# Patient Record
Sex: Male | Born: 1950 | Race: White | Hispanic: No | Smoking: Current every day smoker
Health system: Southern US, Community
[De-identification: ages and names within clinical notes are randomized; demographics above are authoritative.]

## PROBLEM LIST (undated history)

## (undated) DIAGNOSIS — G8929 Other chronic pain: Secondary | ICD-10-CM

## (undated) DIAGNOSIS — G4700 Insomnia, unspecified: Secondary | ICD-10-CM

## (undated) DIAGNOSIS — F4000 Agoraphobia, unspecified: Secondary | ICD-10-CM

## (undated) DIAGNOSIS — F329 Major depressive disorder, single episode, unspecified: Secondary | ICD-10-CM

## (undated) DIAGNOSIS — R9389 Abnormal findings on diagnostic imaging of other specified body structures: Secondary | ICD-10-CM

## (undated) DIAGNOSIS — M8580 Other specified disorders of bone density and structure, unspecified site: Secondary | ICD-10-CM

## (undated) DIAGNOSIS — F32A Depression, unspecified: Secondary | ICD-10-CM

## (undated) DIAGNOSIS — D689 Coagulation defect, unspecified: Secondary | ICD-10-CM

## (undated) DIAGNOSIS — R600 Localized edema: Secondary | ICD-10-CM

## (undated) DIAGNOSIS — R609 Edema, unspecified: Secondary | ICD-10-CM

## (undated) DIAGNOSIS — M545 Other chronic pain: Secondary | ICD-10-CM

## (undated) DIAGNOSIS — J449 Chronic obstructive pulmonary disease, unspecified: Secondary | ICD-10-CM

## (undated) DIAGNOSIS — D66 Hereditary factor VIII deficiency: Secondary | ICD-10-CM

## (undated) DIAGNOSIS — F319 Bipolar disorder, unspecified: Secondary | ICD-10-CM

## (undated) DIAGNOSIS — G629 Polyneuropathy, unspecified: Secondary | ICD-10-CM

## (undated) DIAGNOSIS — F419 Anxiety disorder, unspecified: Secondary | ICD-10-CM

## (undated) HISTORY — DX: Low back pain: M54.5

## (undated) HISTORY — DX: Abnormal findings on diagnostic imaging of other specified body structures: R93.89

## (undated) HISTORY — DX: Other specified disorders of bone density and structure, unspecified site: M85.80

## (undated) HISTORY — DX: Other chronic pain: G89.29

## (undated) HISTORY — DX: Depression, unspecified: F32.A

## (undated) HISTORY — DX: Bipolar disorder, unspecified: F31.9

## (undated) HISTORY — DX: Major depressive disorder, single episode, unspecified: F32.9

## (undated) HISTORY — DX: Other chronic pain: M54.50

## (undated) HISTORY — DX: Polyneuropathy, unspecified: G62.9

## (undated) HISTORY — DX: Agoraphobia, unspecified: F40.00

---

## 2010-06-20 ENCOUNTER — Encounter: Payer: Self-pay | Admitting: Nurse Practitioner

## 2012-03-17 ENCOUNTER — Other Ambulatory Visit: Payer: Self-pay | Admitting: *Deleted

## 2012-03-17 DIAGNOSIS — M8580 Other specified disorders of bone density and structure, unspecified site: Secondary | ICD-10-CM

## 2012-04-01 ENCOUNTER — Other Ambulatory Visit: Payer: Self-pay

## 2012-04-01 ENCOUNTER — Ambulatory Visit: Payer: Self-pay

## 2012-05-24 ENCOUNTER — Other Ambulatory Visit: Payer: Self-pay | Admitting: Nurse Practitioner

## 2012-07-06 ENCOUNTER — Other Ambulatory Visit: Payer: Self-pay | Admitting: Nurse Practitioner

## 2012-07-09 ENCOUNTER — Telehealth: Payer: Self-pay | Admitting: Nurse Practitioner

## 2012-08-06 ENCOUNTER — Other Ambulatory Visit: Payer: Self-pay | Admitting: Family Medicine

## 2012-08-07 NOTE — Telephone Encounter (Signed)
Last seen and last lipids 12/26/11  DFS

## 2012-08-27 ENCOUNTER — Other Ambulatory Visit: Payer: Self-pay

## 2012-08-27 MED ORDER — GABAPENTIN 300 MG PO CAPS: ORAL_CAPSULE | ORAL | Status: DC

## 2012-08-27 NOTE — Telephone Encounter (Signed)
Last seen 12/26/11   Told last fill in May that needed to be seen before next refill  DFS

## 2012-09-06 ENCOUNTER — Other Ambulatory Visit: Payer: Self-pay | Admitting: Family Medicine

## 2012-09-09 NOTE — Telephone Encounter (Signed)
LAST OV 12/13. LAST LABS 12/13. 

## 2012-09-10 NOTE — Telephone Encounter (Signed)
Patient needs to be seen. Has exceeded time since last visit. Needs to bring all medications to next appointment.   

## 2012-09-24 ENCOUNTER — Other Ambulatory Visit: Payer: Self-pay | Admitting: Nurse Practitioner

## 2012-09-25 NOTE — Telephone Encounter (Signed)
This person was last seen 12/26/11 by [facility]

## 2012-10-04 ENCOUNTER — Other Ambulatory Visit: Payer: Self-pay | Admitting: Family Medicine

## 2012-10-05 NOTE — Telephone Encounter (Signed)
Last lipid 12/26/11  DFS

## 2012-10-26 ENCOUNTER — Other Ambulatory Visit: Payer: Self-pay | Admitting: Nurse Practitioner

## 2012-10-27 NOTE — Telephone Encounter (Signed)
Last seen and last lipids 12/26/11  DFS

## 2012-12-04 ENCOUNTER — Other Ambulatory Visit: Payer: Self-pay | Admitting: Family Medicine

## 2012-12-14 ENCOUNTER — Ambulatory Visit: Payer: Self-pay | Admitting: Nurse Practitioner

## 2012-12-16 ENCOUNTER — Ambulatory Visit (INDEPENDENT_AMBULATORY_CARE_PROVIDER_SITE_OTHER): Payer: Medicaid Other | Admitting: Nurse Practitioner

## 2012-12-16 ENCOUNTER — Encounter: Payer: Self-pay | Admitting: Nurse Practitioner

## 2012-12-16 VITALS — BP 138/77 | HR 73 | Temp 98.5°F | Ht 71.0 in | Wt 284.0 lb

## 2012-12-16 DIAGNOSIS — F329 Major depressive disorder, single episode, unspecified: Secondary | ICD-10-CM

## 2012-12-16 DIAGNOSIS — F325 Major depressive disorder, single episode, in full remission: Secondary | ICD-10-CM | POA: Insufficient documentation

## 2012-12-16 DIAGNOSIS — R609 Edema, unspecified: Secondary | ICD-10-CM

## 2012-12-16 DIAGNOSIS — F411 Generalized anxiety disorder: Secondary | ICD-10-CM

## 2012-12-16 DIAGNOSIS — H109 Unspecified conjunctivitis: Secondary | ICD-10-CM

## 2012-12-16 DIAGNOSIS — G609 Hereditary and idiopathic neuropathy, unspecified: Secondary | ICD-10-CM

## 2012-12-16 DIAGNOSIS — E785 Hyperlipidemia, unspecified: Secondary | ICD-10-CM | POA: Insufficient documentation

## 2012-12-16 MED ORDER — TOBRAMYCIN-DEXAMETHASONE 0.3-0.1 % OP SUSP: 2.0000 [drp] | OPHTHALMIC | Status: DC

## 2012-12-16 MED ORDER — GABAPENTIN 300 MG PO CAPS: ORAL_CAPSULE | ORAL | Status: DC

## 2012-12-16 MED ORDER — FUROSEMIDE 20 MG PO TABS: 20.0000 mg | ORAL_TABLET | Freq: Every day | ORAL | Status: DC

## 2012-12-16 MED ORDER — PRAVASTATIN SODIUM 40 MG PO TABS: 40.0000 mg | ORAL_TABLET | Freq: Every day | ORAL | Status: DC

## 2012-12-16 NOTE — Progress Notes (Signed)
Subjective:    Patient ID: Kent Bryant, male    DOB: October 08, 1950, 62 y.o.   MRN: [id number]  Hyperlipidemia This is a chronic problem. The current episode started more than 1 year ago. The problem is uncontrolled. Recent lipid tests were reviewed and are high. Current antihyperlipidemic treatment includes statins. The current treatment provides moderate improvement of lipids. Compliance problems include adherence to diet and adherence to exercise.  Risk factors for coronary artery disease include dyslipidemia, family history, male sex and obesity.  Depression/GAD zoloft and xanax - combination working well for him- no side effects Peripheral neuropathy Gabapentin- works well but still has burning sensation at times Vitamin d deficiency Vitamin d 50,000 u weekly- no c/o side effects  * c/o right foot swollen- started about 2-3 months ago- painful to walk on at times * c/o bil eye irritation- watery and itchy Review of Systems  HENT: Negative.   Eyes: Positive for pain, redness and itching.  Respiratory: Negative.   Cardiovascular: Negative.   Gastrointestinal: Negative.   Genitourinary: Negative.   Musculoskeletal: Negative.   Neurological: Negative.   Psychiatric/Behavioral: Negative.        Objective:   Physical Exam  Constitutional: He is oriented to person, place, and time. He appears well-developed and well-nourished.  HENT:  Head: Normocephalic.  Right Ear: External ear normal.  Left Ear: External ear normal.  Nose: Nose normal.  Mouth/Throat: Oropharynx is clear and moist.  Eyes: EOM are normal. Pupils are equal, round, and reactive to light. Right eye exhibits discharge (watery). Left eye discharge: watery.  Erythematous conjunctiva bil  Neck: Normal range of motion. Neck supple. No JVD present. No thyromegaly present.  Cardiovascular: Normal rate, regular rhythm, normal heart sounds and intact distal pulses.  Exam reveals no gallop and no friction rub.   No murmur  heard. Pulmonary/Chest: Effort normal and breath sounds normal. No respiratory distress. He has no wheezes. He has no rales. He exhibits no tenderness.  Abdominal: Soft. Bowel sounds are normal. He exhibits no mass. There is no tenderness.  Genitourinary:  Refused prostate exam  Musculoskeletal: Normal range of motion. He exhibits edema (2+ pitting edema bil lower ext.).  Lymphadenopathy:    He has no cervical adenopathy.  Neurological: He is alert and oriented to person, place, and time. No cranial nerve deficit.  Skin: Skin is warm and dry.  Psychiatric: He has a normal mood and affect. His behavior is normal. Judgment and thought content normal.     BP 138/77  Pulse 73  Temp(Src) 98.5 F (36.9 C) (Oral)  Ht 5\' 11"  (1.803 m)  Wt 284 lb (128.822 kg)  BMI 39.63 kg/m2      Assessment & Plan:   1. Depression   2. GAD (generalized anxiety disorder)   3. Peripheral neuropathy, idiopathic   4. Hyperlipidemia LDL goal < 130   5. Conjunctivitis   6. Peripheral edema    Orders Placed This Encounter  Procedures  . CMP14+EGFR  . NMR, lipoprofile   Meds ordered this encounter  Medications  . ALPRAZolam (XANAX) 0.25 MG tablet    Sig: Take 0.25 mg by mouth at bedtime as needed for anxiety. Take 1/2/ tablet daily  . gabapentin (NEURONTIN) 300 MG capsule    Sig: 2 po BID    Dispense:  120 capsule    Refill:  5    Order Specific Question:  Supervising Provider    Answer:  Ernestina Penna [[id number]]  . pravastatin (PRAVACHOL) 40 MG tablet  Sig: Take 1 tablet (40 mg total) by mouth daily.    Dispense:  30 tablet    Refill:  5    Order Specific Question:  Supervising Provider    Answer:  Ernestina Penna [[id number]]  . furosemide (LASIX) 20 MG tablet    Sig: Take 1 tablet (20 mg total) by mouth daily.    Dispense:  30 tablet    Refill:  5    Order Specific Question:  Supervising Provider    Answer:  Ernestina Penna [[id number]]  . tobramycin-dexamethasone (TOBRADEX) ophthalmic solution     Sig: Place 2 drops into both eyes every 4 (four) hours while awake.    Dispense:  5 mL    Refill:  0    Order Specific Question:  Supervising Provider    Answer:  Ernestina Penna [[id number]]   Avoid rubbing eyes- good handwashing Elevate legs when sitting Continue all meds Labs pending Diet and exercise encouraged Health maintenance reviewed Follow up in 3 months  Mary-Margaret Daphine Deutscher, FNP

## 2012-12-16 NOTE — Patient Instructions (Signed)

## 2012-12-18 LAB — NMR, LIPOPROFILE
Cholesterol: 159 mg/dL (ref <200)
HDL Cholesterol by NMR: 31 mg/dL — ABNORMAL LOW (ref >=40)
LDL Particle Number: 1886 nmol/L — ABNORMAL HIGH (ref <1000)
Small LDL Particle Number: 1482 nmol/L — ABNORMAL HIGH (ref <=527)
Triglycerides by NMR: 165 mg/dL — ABNORMAL HIGH (ref <150)

## 2012-12-18 LAB — CMP14+EGFR
ALT: 21 IU/L (ref 0–44)
Albumin/Globulin Ratio: 2 (ref 1.1–2.5)
Albumin: 4.5 g/dL (ref 3.6–4.8)
BUN/Creatinine Ratio: 13 (ref 10–22)
BUN: 12 mg/dL (ref 8–27)
Calcium: 9.5 mg/dL (ref 8.6–10.2)
Chloride: 97 mmol/L (ref 97–108)
Creatinine, Ser: 0.93 mg/dL (ref 0.76–1.27)
GFR calc Af Amer: 102 mL/min/{1.73_m2} (ref >59)
GFR calc non Af Amer: 88 mL/min/{1.73_m2} (ref >59)
Glucose: 87 mg/dL (ref 65–99)
Potassium: 4.3 mmol/L (ref 3.5–5.2)
Total Bilirubin: 0.6 mg/dL (ref 0.0–1.2)
Total Protein: 6.7 g/dL (ref 6.0–8.5)

## 2012-12-21 ENCOUNTER — Other Ambulatory Visit: Payer: Self-pay | Admitting: Nurse Practitioner

## 2012-12-21 MED ORDER — ATORVASTATIN CALCIUM 40 MG PO TABS: 40.0000 mg | ORAL_TABLET | Freq: Every day | ORAL | Status: DC

## 2012-12-28 ENCOUNTER — Telehealth: Payer: Self-pay | Admitting: Family Medicine

## 2012-12-28 NOTE — Telephone Encounter (Signed)
Message copied by Azalee Course on Mon Dec 28, 2012 12:38 PM ------      Message from: Bennie Pierini      Created: Mon Dec 21, 2012  8:18 AM       CMP normal      LDL particle numbers elevated- Pravachol not strong enough- need to change to lipitor- rx sent to pharmacy- recheck in 3 months ------

## 2012-12-28 NOTE — Telephone Encounter (Signed)
Pt made aware of lab results and will pick up lipitor presc. And will call back to make 72m appt.  rs

## 2013-03-17 ENCOUNTER — Encounter: Payer: Self-pay | Admitting: Nurse Practitioner

## 2013-03-17 ENCOUNTER — Ambulatory Visit (INDEPENDENT_AMBULATORY_CARE_PROVIDER_SITE_OTHER): Payer: Medicaid Other

## 2013-03-17 ENCOUNTER — Ambulatory Visit (INDEPENDENT_AMBULATORY_CARE_PROVIDER_SITE_OTHER): Payer: Medicaid Other | Admitting: Nurse Practitioner

## 2013-03-17 VITALS — BP 118/72 | HR 86 | Temp 97.2°F | Ht 71.0 in | Wt 292.0 lb

## 2013-03-17 DIAGNOSIS — G609 Hereditary and idiopathic neuropathy, unspecified: Secondary | ICD-10-CM

## 2013-03-17 DIAGNOSIS — I517 Cardiomegaly: Secondary | ICD-10-CM

## 2013-03-17 DIAGNOSIS — F329 Major depressive disorder, single episode, unspecified: Secondary | ICD-10-CM

## 2013-03-17 DIAGNOSIS — Z125 Encounter for screening for malignant neoplasm of prostate: Secondary | ICD-10-CM

## 2013-03-17 DIAGNOSIS — E785 Hyperlipidemia, unspecified: Secondary | ICD-10-CM

## 2013-03-17 DIAGNOSIS — F32A Depression, unspecified: Secondary | ICD-10-CM

## 2013-03-17 DIAGNOSIS — F17200 Nicotine dependence, unspecified, uncomplicated: Secondary | ICD-10-CM

## 2013-03-17 DIAGNOSIS — F3289 Other specified depressive episodes: Secondary | ICD-10-CM

## 2013-03-17 DIAGNOSIS — F411 Generalized anxiety disorder: Secondary | ICD-10-CM

## 2013-03-17 NOTE — Patient Instructions (Signed)
Smoking Cessation Quitting smoking is important to your health and has many advantages. However, it is not always easy to quit since nicotine is a very addictive drug. Often times, people try 3 times or more before being able to quit. This document explains the best ways for you to prepare to quit smoking. Quitting takes hard work and a lot of effort, but you can do it. ADVANTAGES OF QUITTING SMOKING  You will live longer, feel better, and live better.  Your body will feel the impact of quitting smoking almost immediately.  Within 20 minutes, blood pressure decreases. Your pulse returns to its normal level.  After 8 hours, carbon monoxide levels in the blood return to normal. Your oxygen level increases.  After 24 hours, the chance of having a heart attack starts to decrease. Your breath, hair, and body stop smelling like smoke.  After 48 hours, damaged nerve endings begin to recover. Your sense of taste and smell improve.  After 72 hours, the body is virtually free of nicotine. Your bronchial tubes relax and breathing becomes easier.  After 2 to 12 weeks, lungs can hold more air. Exercise becomes easier and circulation improves.  The risk of having a heart attack, stroke, cancer, or lung disease is greatly reduced.  After 1 year, the risk of coronary heart disease is cut in half.  After 5 years, the risk of stroke falls to the same as a nonsmoker.  After 10 years, the risk of lung cancer is cut in half and the risk of other cancers decreases significantly.  After 15 years, the risk of coronary heart disease drops, usually to the level of a nonsmoker.  If you are pregnant, quitting smoking will improve your chances of having a healthy baby.  The people you live with, especially any children, will be healthier.  You will have extra money to spend on things other than cigarettes. QUESTIONS TO THINK ABOUT BEFORE ATTEMPTING TO QUIT You may want to talk about your answers with your  caregiver.  Why do you want to quit?  If you tried to quit in the past, what helped and what did not?  What will be the most difficult situations for you after you quit? How will you plan to handle them?  Who can help you through the tough times? Your family? Friends? A caregiver?  What pleasures do you get from smoking? What ways can you still get pleasure if you quit? Here are some questions to ask your caregiver:  How can you help me to be successful at quitting?  What medicine do you think would be best for me and how should I take it?  What should I do if I need more help?  What is smoking withdrawal like? How can I get information on withdrawal? GET READY  Set a quit date.  Change your environment by getting rid of all cigarettes, ashtrays, matches, and lighters in your home, car, or work. Do not let people smoke in your home.  Review your past attempts to quit. Think about what worked and what did not. GET SUPPORT AND ENCOURAGEMENT You have a better chance of being successful if you have help. You can get support in many ways.  Tell your family, friends, and co-workers that you are going to quit and need their support. Ask them not to smoke around you.  Get individual, group, or telephone counseling and support. Programs are available at local hospitals and health centers. Call your local health department for   information about programs in your area.  Spiritual beliefs and practices may help some smokers quit.  Download a "quit meter" on your computer to keep track of quit statistics, such as how long you have gone without smoking, cigarettes not smoked, and money saved.  Get a self-help book about quitting smoking and staying off of tobacco. LEARN NEW SKILLS AND BEHAVIORS  Distract yourself from urges to smoke. Talk to someone, go for a walk, or occupy your time with a task.  Change your normal routine. Take a different route to work. Drink tea instead of coffee.  Eat breakfast in a different place.  Reduce your stress. Take a hot bath, exercise, or read a book.  Plan something enjoyable to do every day. Reward yourself for not smoking.  Explore interactive web-based programs that specialize in helping you quit. GET MEDICINE AND USE IT CORRECTLY Medicines can help you stop smoking and decrease the urge to smoke. Combining medicine with the above behavioral methods and support can greatly increase your chances of successfully quitting smoking.  Nicotine replacement therapy helps deliver nicotine to your body without the negative effects and risks of smoking. Nicotine replacement therapy includes nicotine gum, lozenges, inhalers, nasal sprays, and skin patches. Some may be available over-the-counter and others require a prescription.  Antidepressant medicine helps people abstain from smoking, but how this works is unknown. This medicine is available by prescription.  Nicotinic receptor partial agonist medicine simulates the effect of nicotine in your brain. This medicine is available by prescription. Ask your caregiver for advice about which medicines to use and how to use them based on your health history. Your caregiver will tell you what side effects to look out for if you choose to be on a medicine or therapy. Carefully read the information on the package. Do not use any other product containing nicotine while using a nicotine replacement product.  RELAPSE OR DIFFICULT SITUATIONS Most relapses occur within the first 3 months after quitting. Do not be discouraged if you start smoking again. Remember, most people try several times before finally quitting. You may have symptoms of withdrawal because your body is used to nicotine. You may crave cigarettes, be irritable, feel very hungry, cough often, get headaches, or have difficulty concentrating. The withdrawal symptoms are only temporary. They are strongest when you first quit, but they will go away within  10 14 days. To reduce the chances of relapse, try to:  Avoid drinking alcohol. Drinking lowers your chances of successfully quitting.  Reduce the amount of caffeine you consume. Once you quit smoking, the amount of caffeine in your body increases and can give you symptoms, such as a rapid heartbeat, sweating, and anxiety.  Avoid smokers because they can make you want to smoke.  Do not let weight gain distract you. Many smokers will gain weight when they quit, usually less than 10 pounds. Eat a healthy diet and stay active. You can always lose the weight gained after you quit.  Find ways to improve your mood other than smoking. FOR MORE INFORMATION  www.smokefree.gov  Document Released: 12/18/2000 Document Revised: 06/25/2011 Document Reviewed: 04/04/2011 [employer] Patient Information 2014 [employer].  

## 2013-03-17 NOTE — Progress Notes (Signed)
Subjective:    Patient ID: Kent Bryant, male    DOB: October 18, 1950, 63 y.o.   MRN: [id number]  Patient in today for follow up of chronic medical follow up- He is doing with with c/o slight congestion- He is unsure of the meds he is on so difficult to assess.  Hyperlipidemia This is a chronic problem. The current episode started more than 1 year ago. The problem is uncontrolled. Recent lipid tests were reviewed and are high. Current antihyperlipidemic treatment includes statins. The current treatment provides moderate improvement of lipids. Compliance problems include adherence to diet and adherence to exercise.  Risk factors for coronary artery disease include dyslipidemia, family history, male sex and obesity.  Depression/GAD zoloft and xanax - combination working well for him- no side effects Peripheral neuropathy Gabapentin- works well but still has burning sensation at times Vitamin d deficiency Vitamin d 50,000 u weekly- no c/o side effects  * still having watery itchy irritation of bil eyes  *Review of Systems  HENT: Positive for congestion.   Eyes: Negative.   Respiratory: Positive for cough.   Cardiovascular: Negative.   Gastrointestinal: Negative.   Genitourinary: Negative.   Musculoskeletal: Negative.   Neurological: Negative.   Psychiatric/Behavioral: Negative.   All other systems reviewed and are negative.       Objective:   Physical Exam  Constitutional: He is oriented to person, place, and time. He appears well-developed and well-nourished.  HENT:  Head: Normocephalic.  Right Ear: External ear normal.  Left Ear: External ear normal.  Nose: Nose normal.  Mouth/Throat: Oropharynx is clear and moist.  Multiple dental caries  Eyes: EOM are normal. Pupils are equal, round, and reactive to light. Left eye discharge: watery.  Neck: Normal range of motion. Neck supple. No JVD present. No thyromegaly present.  Cardiovascular: Normal rate, regular rhythm, normal heart  sounds and intact distal pulses.  Exam reveals no gallop and no friction rub.   No murmur heard. Pulmonary/Chest: Effort normal and breath sounds normal. No respiratory distress. He has no wheezes. He has no rales. He exhibits no tenderness.  Abdominal: Soft. Bowel sounds are normal. He exhibits no mass. There is no tenderness.  Genitourinary:  Refused prostate exam  Musculoskeletal: Normal range of motion. He exhibits edema (2+ pitting edema bil lower ext.).  Lymphadenopathy:    He has no cervical adenopathy.  Neurological: He is alert and oriented to person, place, and time. No cranial nerve deficit.  Skin: Skin is warm and dry.  Psychiatric: He has a normal mood and affect. His behavior is normal. Judgment and thought content normal.     BP 118/72  Pulse 86  Temp(Src) 97.2 F (36.2 C) (Oral)  Ht '5\' 11"'  (1.803 m)  Wt 292 lb (132.45 kg)  BMI 40.74 kg/m2 Chest x ray- cardiomegaly with chronic bronchitic changes-Preliminary reading by Ronnald Collum, FNP  [facility] EKG- NSR     Assessment & Plan:   1. Hyperlipidemia LDL goal < 130   2. Peripheral neuropathy, idiopathic   3. GAD (generalized anxiety disorder)   4. Depression   5. Prostate cancer screening   6. Chain smoker    Orders Placed This Encounter  Procedures  . DG Chest 2 View    Standing Status: Future     Number of Occurrences: 1     Standing Expiration Date: 05/17/2014    Order Specific Question:  Reason for Exam (SYMPTOM  OR DIAGNOSIS REQUIRED)    Answer:  smoker    Order  Specific Question:  Preferred imaging location?    Answer:  Internal  . CMP14+EGFR  . NMR, lipoprofile  . PSA, total and free  . EKG 12-Lead   Meds ordered this encounter  Medications  . pravastatin (PRAVACHOL) 40 MG tablet    Sig: Take 40 mg by mouth daily.   Smoking cessation Labs pending Health maintenance reviewed Diet and exercise encouraged Continue all meds Follow up  In 3 months   [place], FNP

## 2013-03-19 LAB — CMP14+EGFR
ALK PHOS: 77 IU/L (ref 39–117)
ALT: 32 IU/L (ref 0–44)
AST: 26 IU/L (ref 0–40)
Albumin/Globulin Ratio: 2.2 (ref 1.1–2.5)
Albumin: 4.2 g/dL (ref 3.6–4.8)
BILIRUBIN TOTAL: 0.6 mg/dL (ref 0.0–1.2)
BUN/Creatinine Ratio: 9 — ABNORMAL LOW (ref 10–22)
BUN: 9 mg/dL (ref 8–27)
CALCIUM: 8.5 mg/dL — AB (ref 8.6–10.2)
CHLORIDE: 104 mmol/L (ref 97–108)
CO2: 26 mmol/L (ref 18–29)
Creatinine, Ser: 1.03 mg/dL (ref 0.76–1.27)
GFR calc Af Amer: 90 mL/min/{1.73_m2} (ref >59)
GFR calc non Af Amer: 78 mL/min/{1.73_m2} (ref >59)
GLOBULIN, TOTAL: 1.9 g/dL (ref 1.5–4.5)
GLUCOSE: 105 mg/dL — AB (ref 65–99)
POTASSIUM: 4.4 mmol/L (ref 3.5–5.2)
Sodium: 143 mmol/L (ref 134–144)
TOTAL PROTEIN: 6.1 g/dL (ref 6.0–8.5)

## 2013-03-19 LAB — NMR, LIPOPROFILE
CHOLESTEROL: 117 mg/dL (ref <200)
HDL Cholesterol by NMR: 31 mg/dL — ABNORMAL LOW (ref >=40)
HDL PARTICLE NUMBER: 25.8 umol/L — AB (ref >=30.5)
LDL Particle Number: 1068 nmol/L — ABNORMAL HIGH (ref <1000)
LDL SIZE: 20.3 nm — AB (ref >20.5)
LDLC SERPL CALC-MCNC: 66 mg/dL (ref <100)
LP-IR SCORE: 69 — AB (ref <=45)
SMALL LDL PARTICLE NUMBER: 669 nmol/L — AB (ref <=527)
Triglycerides by NMR: 98 mg/dL (ref <150)

## 2013-03-19 LAB — PSA, TOTAL AND FREE
PSA, Free Pct: 40 %
PSA, Free: 0.08 ng/mL
PSA: 0.2 ng/mL (ref 0.0–4.0)

## 2013-03-22 ENCOUNTER — Telehealth: Payer: Self-pay | Admitting: Family Medicine

## 2013-03-22 NOTE — Telephone Encounter (Signed)
Message copied by Waverly Ferrari on Mon Mar 22, 2013 10:52 AM ------      Message from: Chevis Pretty      Created: Sat Mar 20, 2013  8:18 AM       All labs look great      Continue current meds- low fat diet and exercise and recheck in 3 months       ------

## 2013-03-24 NOTE — Telephone Encounter (Signed)
Patient aware.

## 2013-06-28 ENCOUNTER — Ambulatory Visit (INDEPENDENT_AMBULATORY_CARE_PROVIDER_SITE_OTHER): Payer: Medicaid Other | Admitting: Nurse Practitioner

## 2013-06-28 ENCOUNTER — Encounter: Payer: Self-pay | Admitting: Nurse Practitioner

## 2013-06-28 VITALS — BP 136/74 | HR 81 | Temp 98.2°F | Ht 71.0 in | Wt 291.0 lb

## 2013-06-28 DIAGNOSIS — F3289 Other specified depressive episodes: Secondary | ICD-10-CM

## 2013-06-28 DIAGNOSIS — Z6841 Body Mass Index (BMI) 40.0 and over, adult: Secondary | ICD-10-CM

## 2013-06-28 DIAGNOSIS — E785 Hyperlipidemia, unspecified: Secondary | ICD-10-CM

## 2013-06-28 DIAGNOSIS — Z6837 Body mass index (BMI) 37.0-37.9, adult: Secondary | ICD-10-CM | POA: Insufficient documentation

## 2013-06-28 DIAGNOSIS — Z713 Dietary counseling and surveillance: Secondary | ICD-10-CM

## 2013-06-28 DIAGNOSIS — F32A Depression, unspecified: Secondary | ICD-10-CM

## 2013-06-28 DIAGNOSIS — F411 Generalized anxiety disorder: Secondary | ICD-10-CM

## 2013-06-28 DIAGNOSIS — F329 Major depressive disorder, single episode, unspecified: Secondary | ICD-10-CM

## 2013-06-28 DIAGNOSIS — G609 Hereditary and idiopathic neuropathy, unspecified: Secondary | ICD-10-CM

## 2013-06-28 MED ORDER — PRAVASTATIN SODIUM 40 MG PO TABS: 40.0000 mg | ORAL_TABLET | Freq: Every day | ORAL | Status: DC

## 2013-06-28 NOTE — Patient Instructions (Signed)
Smoking Cessation Quitting smoking is important to your health and has many advantages. However, it is not always easy to quit since nicotine is a very addictive drug. Often times, people try 3 times or more before being able to quit. This document explains the best ways for you to prepare to quit smoking. Quitting takes hard work and a lot of effort, but you can do it. ADVANTAGES OF QUITTING SMOKING  You will live longer, feel better, and live better.  Your body will feel the impact of quitting smoking almost immediately.  Within 20 minutes, blood pressure decreases. Your pulse returns to its normal level.  After 8 hours, carbon monoxide levels in the blood return to normal. Your oxygen level increases.  After 24 hours, the chance of having a heart attack starts to decrease. Your breath, hair, and body stop smelling like smoke.  After 48 hours, damaged nerve endings begin to recover. Your sense of taste and smell improve.  After 72 hours, the body is virtually free of nicotine. Your bronchial tubes relax and breathing becomes easier.  After 2 to 12 weeks, lungs can hold more air. Exercise becomes easier and circulation improves.  The risk of having a heart attack, stroke, cancer, or lung disease is greatly reduced.  After 1 year, the risk of coronary heart disease is cut in half.  After 5 years, the risk of stroke falls to the same as a nonsmoker.  After 10 years, the risk of lung cancer is cut in half and the risk of other cancers decreases significantly.  After 15 years, the risk of coronary heart disease drops, usually to the level of a nonsmoker.  If you are pregnant, quitting smoking will improve your chances of having a healthy baby.  The people you live with, especially any children, will be healthier.  You will have extra money to spend on things other than cigarettes. QUESTIONS TO THINK ABOUT BEFORE ATTEMPTING TO QUIT You may want to talk about your answers with your  caregiver.  Why do you want to quit?  If you tried to quit in the past, what helped and what did not?  What will be the most difficult situations for you after you quit? How will you plan to handle them?  Who can help you through the tough times? Your family? Friends? A caregiver?  What pleasures do you get from smoking? What ways can you still get pleasure if you quit? Here are some questions to ask your caregiver:  How can you help me to be successful at quitting?  What medicine do you think would be best for me and how should I take it?  What should I do if I need more help?  What is smoking withdrawal like? How can I get information on withdrawal? GET READY  Set a quit date.  Change your environment by getting rid of all cigarettes, ashtrays, matches, and lighters in your home, car, or work. Do not let people smoke in your home.  Review your past attempts to quit. Think about what worked and what did not. GET SUPPORT AND ENCOURAGEMENT You have a better chance of being successful if you have help. You can get support in many ways.  Tell your family, friends, and co-workers that you are going to quit and need their support. Ask them not to smoke around you.  Get individual, group, or telephone counseling and support. Programs are available at local hospitals and health centers. Call your local health department for   information about programs in your area.  Spiritual beliefs and practices may help some smokers quit.  Download a "quit meter" on your computer to keep track of quit statistics, such as how long you have gone without smoking, cigarettes not smoked, and money saved.  Get a self-help book about quitting smoking and staying off of tobacco. LEARN NEW SKILLS AND BEHAVIORS  Distract yourself from urges to smoke. Talk to someone, go for a walk, or occupy your time with a task.  Change your normal routine. Take a different route to work. Drink tea instead of coffee.  Eat breakfast in a different place.  Reduce your stress. Take a hot bath, exercise, or read a book.  Plan something enjoyable to do every day. Reward yourself for not smoking.  Explore interactive web-based programs that specialize in helping you quit. GET MEDICINE AND USE IT CORRECTLY Medicines can help you stop smoking and decrease the urge to smoke. Combining medicine with the above behavioral methods and support can greatly increase your chances of successfully quitting smoking.  Nicotine replacement therapy helps deliver nicotine to your body without the negative effects and risks of smoking. Nicotine replacement therapy includes nicotine gum, lozenges, inhalers, nasal sprays, and skin patches. Some may be available over-the-counter and others require a prescription.  Antidepressant medicine helps people abstain from smoking, but how this works is unknown. This medicine is available by prescription.  Nicotinic receptor partial agonist medicine simulates the effect of nicotine in your brain. This medicine is available by prescription. Ask your caregiver for advice about which medicines to use and how to use them based on your health history. Your caregiver will tell you what side effects to look out for if you choose to be on a medicine or therapy. Carefully read the information on the package. Do not use any other product containing nicotine while using a nicotine replacement product.  RELAPSE OR DIFFICULT SITUATIONS Most relapses occur within the first 3 months after quitting. Do not be discouraged if you start smoking again. Remember, most people try several times before finally quitting. You may have symptoms of withdrawal because your body is used to nicotine. You may crave cigarettes, be irritable, feel very hungry, cough often, get headaches, or have difficulty concentrating. The withdrawal symptoms are only temporary. They are strongest when you first quit, but they will go away within  10-14 days. To reduce the chances of relapse, try to:  Avoid drinking alcohol. Drinking lowers your chances of successfully quitting.  Reduce the amount of caffeine you consume. Once you quit smoking, the amount of caffeine in your body increases and can give you symptoms, such as a rapid heartbeat, sweating, and anxiety.  Avoid smokers because they can make you want to smoke.  Do not let weight gain distract you. Many smokers will gain weight when they quit, usually less than 10 pounds. Eat a healthy diet and stay active. You can always lose the weight gained after you quit.  Find ways to improve your mood other than smoking. FOR MORE INFORMATION  www.smokefree.gov  Document Released: 12/18/2000 Document Revised: 06/25/2011 Document Reviewed: 04/04/2011 [employer] Patient Information 2015 [employer]. This information is not intended to replace advice given to you by your health care Zoran Yankee. Make sure you discuss any questions you have with your health care Estephania Licciardi.  

## 2013-06-28 NOTE — Progress Notes (Signed)
Subjective:    Patient ID: Kent Bryant, male    DOB: 1950/04/21, 63 y.o.   MRN: [id number]  Patient in today for follow up of chronic medical follow up- He is doing with with c/o slight congestion- He is unsure of the meds he is on so difficult to assess.  Hyperlipidemia This is a chronic problem. The current episode started more than 1 year ago. The problem is uncontrolled. Recent lipid tests were reviewed and are high. Current antihyperlipidemic treatment includes statins. The current treatment provides moderate improvement of lipids. Compliance problems include adherence to diet and adherence to exercise.  Risk factors for coronary artery disease include dyslipidemia, family history, male sex and obesity.  Depression/GAD zoloft and xanax - combination working well for him- no side effects Peripheral neuropathy Gabapentin- works well but still has burning sensation at times Vitamin d deficiency Vitamin d 50,000 u weekly- no c/o side effects  * still having watery itchy irritation of bil eyes  *Review of Systems  HENT: Positive for congestion.   Eyes: Negative.   Respiratory: Positive for cough.   Cardiovascular: Negative.   Gastrointestinal: Negative.   Genitourinary: Negative.   Musculoskeletal: Negative.   Neurological: Negative.   Psychiatric/Behavioral: Negative.   All other systems reviewed and are negative.      Objective:   Physical Exam  Constitutional: He is oriented to person, place, and time. He appears well-developed and well-nourished.  HENT:  Head: Normocephalic.  Right Ear: External ear normal.  Left Ear: External ear normal.  Nose: Nose normal.  Mouth/Throat: Oropharynx is clear and moist.  Multiple dental caries  Eyes: EOM are normal. Pupils are equal, round, and reactive to light. Left eye discharge: watery.  Neck: Normal range of motion. Neck supple. No JVD present. No thyromegaly present.  Cardiovascular: Normal rate, regular rhythm, normal heart  sounds and intact distal pulses.  Exam reveals no gallop and no friction rub.   No murmur heard. Pulmonary/Chest: Effort normal and breath sounds normal. No respiratory distress. He has no wheezes. He has no rales. He exhibits no tenderness.  Abdominal: Soft. Bowel sounds are normal. He exhibits no mass. There is no tenderness.  Umbilical hernia   Genitourinary:  Refused prostate exam  Musculoskeletal: Normal range of motion. He exhibits edema (2+ pitting edema bil lower ext.).  Lymphadenopathy:    He has no cervical adenopathy.  Neurological: He is alert and oriented to person, place, and time. No cranial nerve deficit.  Skin: Skin is warm and dry.  Psychiatric: He has a normal mood and affect. His behavior is normal. Judgment and thought content normal.     BP 136/74  Pulse 81  Temp(Src) 98.2 F (36.8 C) (Oral)  Ht _0  (1.803 m)  Wt 291 lb (131.997 kg)  BMI 40.60 kg/m2 Chest x ray- cardiomegaly with chronic bronchitic changes-Preliminary reading by Ronnald Collum, FNP  [facility] EKG- NSR     Assessment & Plan:   1. Peripheral neuropathy, idiopathic   2. Hyperlipidemia with target LDL less than 130   3. GAD (generalized anxiety disorder)   4. Depression   5. BMI 40.0-44.9, adult   6. Weight loss counseling, encounter for    Orders Placed This Encounter  Procedures  . CMP14+EGFR  . NMR, lipoprofile   Meds ordered this encounter  Medications  . traZODone (DESYREL) 100 MG tablet    Sig: Take 100 mg by mouth at bedtime.  . pravastatin (PRAVACHOL) 40 MG tablet    Sig: Take  1 tablet (40 mg total) by mouth daily.    Dispense:  30 tablet    Refill:  5    Order Specific Question:  Supervising Provider    Answer:  Chipper Herb [[id number]]  hemoccult cards given to patient with directions Smoking cessation discussed and encouraged Labs pending Health maintenance reviewed Diet and exercise encouraged Continue all meds Follow up  In 3 months   [place],  FNP

## 2013-06-29 LAB — NMR, LIPOPROFILE
CHOLESTEROL: 157 mg/dL (ref 100–199)
HDL Cholesterol by NMR: 28 mg/dL — ABNORMAL LOW (ref >39)
HDL Particle Number: 25.3 umol/L — ABNORMAL LOW (ref >=30.5)
LDL PARTICLE NUMBER: 1516 nmol/L — AB (ref <1000)
LDL Size: 19.8 nm (ref >20.5)
LDLC SERPL CALC-MCNC: 97 mg/dL (ref 0–99)
LP-IR Score: 75 — ABNORMAL HIGH (ref <=45)
Small LDL Particle Number: 1120 nmol/L — ABNORMAL HIGH (ref <=527)
TRIGLYCERIDES BY NMR: 161 mg/dL — AB (ref 0–149)

## 2013-06-29 LAB — CMP14+EGFR
ALBUMIN: 4.3 g/dL (ref 3.6–4.8)
ALT: 17 IU/L (ref 0–44)
AST: 18 IU/L (ref 0–40)
Albumin/Globulin Ratio: 2.3 (ref 1.1–2.5)
Alkaline Phosphatase: 68 IU/L (ref 39–117)
BILIRUBIN TOTAL: 0.6 mg/dL (ref 0.0–1.2)
BUN/Creatinine Ratio: 12 (ref 10–22)
BUN: 11 mg/dL (ref 8–27)
CHLORIDE: 103 mmol/L (ref 97–108)
CO2: 24 mmol/L (ref 18–29)
Calcium: 9.5 mg/dL (ref 8.6–10.2)
Creatinine, Ser: 0.9 mg/dL (ref 0.76–1.27)
GFR calc Af Amer: 106 mL/min/{1.73_m2} (ref >59)
GFR calc non Af Amer: 92 mL/min/{1.73_m2} (ref >59)
GLUCOSE: 106 mg/dL — AB (ref 65–99)
Globulin, Total: 1.9 g/dL (ref 1.5–4.5)
Potassium: 4.4 mmol/L (ref 3.5–5.2)
Sodium: 142 mmol/L (ref 134–144)
TOTAL PROTEIN: 6.2 g/dL (ref 6.0–8.5)

## 2013-09-17 ENCOUNTER — Telehealth: Payer: Self-pay | Admitting: Family Medicine

## 2013-09-23 NOTE — Telephone Encounter (Signed)
Unable to reach by phone after multiple attempts.

## 2013-09-29 ENCOUNTER — Ambulatory Visit: Payer: Medicaid Other | Admitting: Nurse Practitioner

## 2013-10-06 ENCOUNTER — Other Ambulatory Visit: Payer: Self-pay | Admitting: Nurse Practitioner

## 2013-10-08 ENCOUNTER — Other Ambulatory Visit: Payer: Self-pay | Admitting: Nurse Practitioner

## 2013-10-21 ENCOUNTER — Ambulatory Visit (INDEPENDENT_AMBULATORY_CARE_PROVIDER_SITE_OTHER): Payer: Medicaid Other | Admitting: Nurse Practitioner

## 2013-10-21 ENCOUNTER — Encounter: Payer: Self-pay | Admitting: Nurse Practitioner

## 2013-10-21 VITALS — BP 133/78 | HR 82 | Temp 97.0°F | Ht 71.0 in | Wt 295.1 lb

## 2013-10-21 DIAGNOSIS — F32A Depression, unspecified: Secondary | ICD-10-CM

## 2013-10-21 DIAGNOSIS — E785 Hyperlipidemia, unspecified: Secondary | ICD-10-CM

## 2013-10-21 DIAGNOSIS — Z125 Encounter for screening for malignant neoplasm of prostate: Secondary | ICD-10-CM

## 2013-10-21 DIAGNOSIS — Z6841 Body Mass Index (BMI) 40.0 and over, adult: Secondary | ICD-10-CM

## 2013-10-21 DIAGNOSIS — F411 Generalized anxiety disorder: Secondary | ICD-10-CM

## 2013-10-21 DIAGNOSIS — G609 Hereditary and idiopathic neuropathy, unspecified: Secondary | ICD-10-CM

## 2013-10-21 DIAGNOSIS — E876 Hypokalemia: Secondary | ICD-10-CM | POA: Insufficient documentation

## 2013-10-21 DIAGNOSIS — F329 Major depressive disorder, single episode, unspecified: Secondary | ICD-10-CM

## 2013-10-21 MED ORDER — POTASSIUM CHLORIDE CRYS ER 10 MEQ PO TBCR: 10.0000 meq | EXTENDED_RELEASE_TABLET | Freq: Two times a day (BID) | ORAL | Status: DC

## 2013-10-21 NOTE — Progress Notes (Signed)
Subjective:    Patient ID: Kent Bryant, male    DOB: 1950/06/03, 63 y.o.   MRN: [id number]  Patient in today for follow up of chronic medical follow up- He is doing with with c/o slight congestion- He is unsure of the meds he is on so difficult to assess. Went to the Er last wek and was told that he had a calcium build up around his heart. Didn't know what they were going to do about it. Er doctor said that SOB was actually from COPD and was told to use spirivia daily.  Hyperlipidemia This is a chronic problem. The current episode started more than 1 year ago. The problem is uncontrolled. Recent lipid tests were reviewed and are high. Current antihyperlipidemic treatment includes statins. The current treatment provides moderate improvement of lipids. Compliance problems include adherence to diet and adherence to exercise.  Risk factors for coronary artery disease include dyslipidemia, family history, male sex and obesity.  Depression/GAD zoloft and xanax - combination working well for him- no side effects Peripheral neuropathy Gabapentin- works well but still has burning sensation at times Vitamin d deficiency Vitamin d 50,000 u weekly- no c/o side effects hypokalemia  Was started on K+ supplements by ER physician- mainly because he doubled lasix dose due to SOB.  *Review of Systems  HENT: Positive for congestion.   Eyes: Negative.   Respiratory: Positive for cough.   Cardiovascular: Negative.   Gastrointestinal: Negative.   Genitourinary: Negative.   Musculoskeletal: Negative.   Neurological: Negative.   Psychiatric/Behavioral: Negative.   All other systems reviewed and are negative.      Objective:   Physical Exam  Constitutional: He is oriented to person, place, and time. He appears well-developed and well-nourished.  HENT:  Head: Normocephalic.  Right Ear: External ear normal.  Left Ear: External ear normal.  Nose: Nose normal.  Mouth/Throat: Oropharynx is clear and moist.   Multiple dental caries  Eyes: EOM are normal. Pupils are equal, round, and reactive to light. Left eye discharge: watery.  Neck: Normal range of motion. Neck supple. No JVD present. No thyromegaly present.  Cardiovascular: Normal rate, regular rhythm, normal heart sounds and intact distal pulses.  Exam reveals no gallop and no friction rub.   No murmur heard. Pulmonary/Chest: Effort normal and breath sounds normal. No respiratory distress. He has no wheezes. He has no rales. He exhibits no tenderness.  Abdominal: Soft. Bowel sounds are normal. He exhibits no mass. There is no tenderness.  Umbilical hernia   Genitourinary:  Refused prostate exam  Musculoskeletal: Normal range of motion. He exhibits edema (2+ pitting edema bil lower ext.).  Lymphadenopathy:    He has no cervical adenopathy.  Neurological: He is alert and oriented to person, place, and time. No cranial nerve deficit.  Skin: Skin is warm and dry.  Psychiatric: He has a normal mood and affect. His behavior is normal. Judgment and thought content normal.     BP 133/78  Pulse 82  Temp(Src) 97 F (36.1 C) (Oral)  Ht _0  (1.803 m)  Wt 295 lb 2 oz (133.868 kg)  BMI 41.18 kg/m2     Assessment & Plan:   1. Peripheral neuropathy, idiopathic  2. Hyperlipidemia with target LDL less than 130  3. GAD (generalized anxiety disorder)  4. Depression  5. BMI 40.0-44.9, adult Discussed diet and exercise for person with BMI >25 Will recheck weight in 3-6 months  6. Hypokalemia  7. Hospital follow up- SOB/COPD Use spirivia daily  Orders Placed This Encounter  Procedures  . CMP14+EGFR  . NMR, lipoprofile  . PSA, total and free   STOP smoking Labs pending Health maintenance reviewed Diet and exercise encouraged Continue all meds Follow up  In 3 months   [place], FNP

## 2013-10-21 NOTE — Patient Instructions (Signed)

## 2013-10-22 ENCOUNTER — Encounter: Payer: Self-pay | Admitting: Nurse Practitioner

## 2013-10-22 LAB — CMP14+EGFR
ALK PHOS: 68 IU/L (ref 39–117)
ALT: 23 IU/L (ref 0–44)
AST: 23 IU/L (ref 0–40)
Albumin/Globulin Ratio: 2 (ref 1.1–2.5)
Albumin: 4.4 g/dL (ref 3.6–4.8)
BILIRUBIN TOTAL: 0.5 mg/dL (ref 0.0–1.2)
BUN / CREAT RATIO: 8 — AB (ref 10–22)
BUN: 9 mg/dL (ref 8–27)
CO2: 24 mmol/L (ref 18–29)
CREATININE: 1.16 mg/dL (ref 0.76–1.27)
Calcium: 8.7 mg/dL (ref 8.6–10.2)
Chloride: 100 mmol/L (ref 97–108)
GFR, EST AFRICAN AMERICAN: 78 mL/min/{1.73_m2} (ref >59)
GFR, EST NON AFRICAN AMERICAN: 68 mL/min/{1.73_m2} (ref >59)
GLOBULIN, TOTAL: 2.2 g/dL (ref 1.5–4.5)
Glucose: 107 mg/dL — ABNORMAL HIGH (ref 65–99)
Potassium: 3.7 mmol/L (ref 3.5–5.2)
SODIUM: 141 mmol/L (ref 134–144)
Total Protein: 6.6 g/dL (ref 6.0–8.5)

## 2013-10-22 LAB — NMR, LIPOPROFILE
Cholesterol: 157 mg/dL (ref 100–199)
HDL Cholesterol by NMR: 26 mg/dL — ABNORMAL LOW (ref >39)
HDL Particle Number: 26 umol/L — ABNORMAL LOW (ref >=30.5)
LDL Particle Number: 1335 nmol/L — ABNORMAL HIGH (ref <1000)
LDL Size: 20.1 nm (ref >20.5)
LDLC SERPL CALC-MCNC: 90 mg/dL (ref 0–99)
LP-IR SCORE: 82 — AB (ref <=45)
Small LDL Particle Number: 847 nmol/L — ABNORMAL HIGH (ref <=527)
Triglycerides by NMR: 203 mg/dL — ABNORMAL HIGH (ref 0–149)

## 2013-10-22 LAB — PSA, TOTAL AND FREE
PSA, Free Pct: 35 %
PSA, Free: 0.07 ng/mL
PSA: 0.2 ng/mL (ref 0.0–4.0)

## 2014-01-02 ENCOUNTER — Other Ambulatory Visit: Payer: Self-pay | Admitting: Nurse Practitioner

## 2014-01-31 ENCOUNTER — Ambulatory Visit: Payer: Medicaid Other | Admitting: Nurse Practitioner

## 2014-03-03 ENCOUNTER — Ambulatory Visit (INDEPENDENT_AMBULATORY_CARE_PROVIDER_SITE_OTHER): Payer: Medicaid Other | Admitting: Nurse Practitioner

## 2014-03-03 ENCOUNTER — Encounter: Payer: Self-pay | Admitting: Nurse Practitioner

## 2014-03-03 VITALS — BP 132/83 | HR 93 | Temp 98.9°F | Ht 71.0 in | Wt 302.8 lb

## 2014-03-03 DIAGNOSIS — F329 Major depressive disorder, single episode, unspecified: Secondary | ICD-10-CM

## 2014-03-03 DIAGNOSIS — E876 Hypokalemia: Secondary | ICD-10-CM

## 2014-03-03 DIAGNOSIS — G4700 Insomnia, unspecified: Secondary | ICD-10-CM | POA: Insufficient documentation

## 2014-03-03 DIAGNOSIS — R609 Edema, unspecified: Secondary | ICD-10-CM

## 2014-03-03 DIAGNOSIS — F411 Generalized anxiety disorder: Secondary | ICD-10-CM

## 2014-03-03 DIAGNOSIS — Z6841 Body Mass Index (BMI) 40.0 and over, adult: Secondary | ICD-10-CM

## 2014-03-03 DIAGNOSIS — Z125 Encounter for screening for malignant neoplasm of prostate: Secondary | ICD-10-CM

## 2014-03-03 DIAGNOSIS — R600 Localized edema: Secondary | ICD-10-CM | POA: Insufficient documentation

## 2014-03-03 DIAGNOSIS — E785 Hyperlipidemia, unspecified: Secondary | ICD-10-CM

## 2014-03-03 DIAGNOSIS — F32A Depression, unspecified: Secondary | ICD-10-CM

## 2014-03-03 DIAGNOSIS — G609 Hereditary and idiopathic neuropathy, unspecified: Secondary | ICD-10-CM

## 2014-03-03 MED ORDER — TRAZODONE HCL 100 MG PO TABS: 100.0000 mg | ORAL_TABLET | Freq: Every day | ORAL | Status: DC

## 2014-03-03 MED ORDER — SERTRALINE HCL 100 MG PO TABS: 100.0000 mg | ORAL_TABLET | Freq: Every day | ORAL | Status: DC

## 2014-03-03 MED ORDER — FUROSEMIDE 20 MG PO TABS: ORAL_TABLET | ORAL | Status: DC

## 2014-03-03 MED ORDER — GABAPENTIN 300 MG PO CAPS: 600.0000 mg | ORAL_CAPSULE | Freq: Two times a day (BID) | ORAL | Status: DC

## 2014-03-03 MED ORDER — PRAVASTATIN SODIUM 40 MG PO TABS
ORAL_TABLET | ORAL | Status: DC
Start: 2014-03-03 — End: 2014-03-07

## 2014-03-03 MED ORDER — POTASSIUM CHLORIDE CRYS ER 10 MEQ PO TBCR: 10.0000 meq | EXTENDED_RELEASE_TABLET | Freq: Two times a day (BID) | ORAL | Status: DC

## 2014-03-03 NOTE — Patient Instructions (Signed)
Exercise to Stay Healthy Exercise helps you become and stay healthy. EXERCISE IDEAS AND TIPS Choose exercises that:  You enjoy.  Fit into your day. You do not need to exercise really hard to be healthy. You can do exercises at a slow or medium level and stay healthy. You can:  Stretch before and after working out.  Try yoga, Pilates, or tai chi.  Lift weights.  Walk fast, swim, jog, run, climb stairs, bicycle, dance, or rollerskate.  Take aerobic classes. Exercises that burn about 150 calories:  Running 1  miles in 15 minutes.  Playing volleyball for 45 to 60 minutes.  Washing and waxing a car for 45 to 60 minutes.  Playing touch football for 45 minutes.  Walking 1  miles in 35 minutes.  Pushing a stroller 1  miles in 30 minutes.  Playing basketball for 30 minutes.  Raking leaves for 30 minutes.  Bicycling 5 miles in 30 minutes.  Walking 2 miles in 30 minutes.  Dancing for 30 minutes.  Shoveling snow for 15 minutes.  Swimming laps for 20 minutes.  Walking up stairs for 15 minutes.  Bicycling 4 miles in 15 minutes.  Gardening for 30 to 45 minutes.  Jumping rope for 15 minutes.  Washing windows or floors for 45 to 60 minutes. Document Released: 01/26/2010 Document Revised: 03/18/2011 Document Reviewed: 01/26/2010 [facility] Patient Information 2015 [place], [place]. This information is not intended to replace advice given to you by your health care provider. Make sure you discuss any questions you have with your health care provider.

## 2014-03-03 NOTE — Addendum Note (Signed)
Addended by: Earlene Plater on: 03/03/2014 11:43 AM   Modules accepted: Miquel Dunn

## 2014-03-03 NOTE — Progress Notes (Signed)
Subjective:    Patient ID: Kent Bryant, male    DOB: 1950/09/12, 64 y.o.   MRN: [id number]  Patient in today for follow up of chronic medical follow up-  He is unsure of the meds he is on so difficult to assess.  Hyperlipidemia This is a chronic problem. The current episode started more than 1 year ago. The problem is uncontrolled. Recent lipid tests were reviewed and are high. Current antihyperlipidemic treatment includes statins. The current treatment provides moderate improvement of lipids. Compliance problems include adherence to diet and adherence to exercise.  Risk factors for coronary artery disease include dyslipidemia, family history, male sex and obesity.  Depression/GAD zoloft and xanax - combination working well for him- no side effects Peripheral neuropathy Gabapentin- works well but still has burning sensation at times Vitamin d deficiency Vitamin d 50,000 u weekly- no c/o side effects    *Review of Systems  HENT: Positive for congestion.   Eyes: Negative.   Respiratory: Positive for cough.   Cardiovascular: Negative.   Gastrointestinal: Negative.   Genitourinary: Negative.   Musculoskeletal: Negative.   Neurological: Negative.   Psychiatric/Behavioral: Negative.   All other systems reviewed and are negative.      Objective:   Physical Exam  Constitutional: He is oriented to person, place, and time. He appears well-developed and well-nourished.  HENT:  Head: Normocephalic.  Right Ear: External ear normal.  Left Ear: External ear normal.  Nose: Nose normal.  Mouth/Throat: Oropharynx is clear and moist.  Multiple dental caries  Eyes: EOM are normal. Pupils are equal, round, and reactive to light. Left eye discharge: watery.  Neck: Normal range of motion. Neck supple. No JVD present. No thyromegaly present.  Cardiovascular: Normal rate, regular rhythm, normal heart sounds and intact distal pulses.  Exam reveals no gallop and no friction rub.   No murmur  heard. Pulmonary/Chest: Effort normal and breath sounds normal. No respiratory distress. He has no wheezes. He has no rales. He exhibits no tenderness.  Abdominal: Soft. Bowel sounds are normal. He exhibits no mass. There is no tenderness.  Umbilical hernia   Genitourinary:  Refused prostate exam  Musculoskeletal: Normal range of motion. He exhibits edema (2+ pitting edema bil lower ext.).  Lymphadenopathy:    He has no cervical adenopathy.  Neurological: He is alert and oriented to person, place, and time. No cranial nerve deficit.  Skin: Skin is warm and dry.  Psychiatric: He has a normal mood and affect. His behavior is normal. Judgment and thought content normal.   BP 132/83 mmHg  Pulse 93  Temp(Src) 98.9 F (37.2 C) (Oral)  Ht '5\' 11"'  (1.803 m)  Wt 302 lb 12.8 oz (137.349 kg)  BMI 42.25 kg/m2       Assessment & Plan:   1. Peripheral neuropathy, idiopathic Wear compression socks - gabapentin (NEURONTIN) 300 MG capsule; Take 2 capsules (600 mg total) by mouth 2 (two) times daily.  Dispense: 120 capsule; Refill: 5  2. Hypokalemia - potassium chloride (K-DUR,KLOR-CON) 10 MEQ tablet; Take 1 tablet (10 mEq total) by mouth 2 (two) times daily.  Dispense: 30 tablet; Refill: 5  3. Hyperlipidemia with target LDL less than 130 Low fat diet - pravastatin (PRAVACHOL) 40 MG tablet; TAKE 1 TABLET (40 MG TOTAL) BY MOUTH DAILY.  Dispense: 30 tablet; Refill: 5 - CMP14+EGFR - NMR, lipoprofile  4. GAD (generalized anxiety disorder) Stress management  5. Depression - sertraline (ZOLOFT) 100 MG tablet; Take 1 tablet (100 mg total) by mouth  daily. Take two tablets by mouth at bedtime daily  Dispense: 30 tablet; Refill: 5  6. BMI 40.0-44.9, adult Discussed diet and exercise for person with BMI >25 Will recheck weight in 3-6 months   7. Peripheral edema Elevate legs when sitting - furosemide (LASIX) 20 MG tablet; TAKE 1 TABLET (20 MG TOTAL) BY MOUTH DAILY.  Dispense: 30 tablet;  Refill: 5  8. Insomnia Bedtime ritual - traZODone (DESYREL) 100 MG tablet; Take 1 tablet (100 mg total) by mouth at bedtime.  Dispense: 30 tablet; Refill: 5  9. Prostate cancer screening - PSA, total and free   Refuses all health maintenance Labs pending Health maintenance reviewed Diet and exercise encouraged Continue all meds Follow up  In 3 months   [place], FNP

## 2014-03-04 LAB — CMP14+EGFR
ALT: 23 IU/L (ref 0–44)
AST: 23 IU/L (ref 0–40)
Albumin/Globulin Ratio: 2 (ref 1.1–2.5)
Albumin: 4.3 g/dL (ref 3.6–4.8)
Alkaline Phosphatase: 63 IU/L (ref 39–117)
BILIRUBIN TOTAL: 0.7 mg/dL (ref 0.0–1.2)
BUN / CREAT RATIO: 10 (ref 10–22)
BUN: 10 mg/dL (ref 8–27)
CHLORIDE: 101 mmol/L (ref 97–108)
CO2: 25 mmol/L (ref 18–29)
Calcium: 8.9 mg/dL (ref 8.6–10.2)
Creatinine, Ser: 1.05 mg/dL (ref 0.76–1.27)
GFR calc Af Amer: 88 mL/min/{1.73_m2} (ref >59)
GFR calc non Af Amer: 76 mL/min/{1.73_m2} (ref >59)
Globulin, Total: 2.2 g/dL (ref 1.5–4.5)
Glucose: 102 mg/dL — ABNORMAL HIGH (ref 65–99)
POTASSIUM: 4.3 mmol/L (ref 3.5–5.2)
Sodium: 142 mmol/L (ref 134–144)
Total Protein: 6.5 g/dL (ref 6.0–8.5)

## 2014-03-04 LAB — NMR, LIPOPROFILE
Cholesterol: 163 mg/dL (ref 100–199)
HDL Cholesterol by NMR: 30 mg/dL — ABNORMAL LOW (ref >39)
HDL PARTICLE NUMBER: 26.4 umol/L — AB (ref >=30.5)
LDL Particle Number: 1556 nmol/L — ABNORMAL HIGH (ref <1000)
LDL Size: 20 nm (ref >20.5)
LDL-C: 102 mg/dL — AB (ref 0–99)
LP-IR Score: 77 — ABNORMAL HIGH (ref <=45)
SMALL LDL PARTICLE NUMBER: 1007 nmol/L — AB (ref <=527)
Triglycerides by NMR: 156 mg/dL — ABNORMAL HIGH (ref 0–149)

## 2014-03-04 LAB — PSA, TOTAL AND FREE
PSA FREE PCT: 40 %
PSA FREE: 0.08 ng/mL
PSA: 0.2 ng/mL (ref 0.0–4.0)

## 2014-03-07 ENCOUNTER — Other Ambulatory Visit: Payer: Self-pay | Admitting: Nurse Practitioner

## 2014-03-07 MED ORDER — ATORVASTATIN CALCIUM 40 MG PO TABS
40.0000 mg | ORAL_TABLET | Freq: Every day | ORAL | Status: DC
Start: 2014-03-07 — End: 2014-09-16

## 2014-06-08 ENCOUNTER — Ambulatory Visit (INDEPENDENT_AMBULATORY_CARE_PROVIDER_SITE_OTHER): Payer: Medicaid Other | Admitting: Nurse Practitioner

## 2014-06-08 ENCOUNTER — Encounter: Payer: Self-pay | Admitting: Nurse Practitioner

## 2014-06-08 VITALS — BP 132/82 | HR 88 | Temp 97.2°F | Ht 71.0 in | Wt 297.0 lb

## 2014-06-08 DIAGNOSIS — Z6841 Body Mass Index (BMI) 40.0 and over, adult: Secondary | ICD-10-CM | POA: Diagnosis not present

## 2014-06-08 DIAGNOSIS — G4700 Insomnia, unspecified: Secondary | ICD-10-CM | POA: Diagnosis not present

## 2014-06-08 DIAGNOSIS — F411 Generalized anxiety disorder: Secondary | ICD-10-CM | POA: Diagnosis not present

## 2014-06-08 DIAGNOSIS — G609 Hereditary and idiopathic neuropathy, unspecified: Secondary | ICD-10-CM | POA: Diagnosis not present

## 2014-06-08 DIAGNOSIS — E876 Hypokalemia: Secondary | ICD-10-CM

## 2014-06-08 DIAGNOSIS — E785 Hyperlipidemia, unspecified: Secondary | ICD-10-CM

## 2014-06-08 DIAGNOSIS — F329 Major depressive disorder, single episode, unspecified: Secondary | ICD-10-CM | POA: Diagnosis not present

## 2014-06-08 DIAGNOSIS — R609 Edema, unspecified: Secondary | ICD-10-CM

## 2014-06-08 DIAGNOSIS — F32A Depression, unspecified: Secondary | ICD-10-CM

## 2014-06-08 NOTE — Progress Notes (Signed)
Subjective:    Patient ID: Kent Bryant, male    DOB: 1950-07-28, 64 y.o.   MRN: [id number]  Patient in today for follow up of chronic medical follow up-  He is unsure of the meds he is on so difficult to assess.  Hyperlipidemia This is a chronic problem. The current episode started more than 1 year ago. The problem is uncontrolled. Recent lipid tests were reviewed and are high. Current antihyperlipidemic treatment includes statins. The current treatment provides moderate improvement of lipids. Compliance problems include adherence to diet and adherence to exercise.  Risk factors for coronary artery disease include dyslipidemia, family history, male sex and obesity.  Depression/GAD zoloft and xanax - combination working well for him- no side effects Peripheral neuropathy Gabapentin- works well but still has burning sensation at times Vitamin d deficiency Vitamin d 50,000 u weekly- no c/o side effects insomnia trazadone nightly works well- feels rest in AM. Hypokalemia K dur daily.   *Review of Systems  HENT: Positive for congestion.   Eyes: Negative.   Respiratory: Positive for cough.   Cardiovascular: Negative.   Gastrointestinal: Negative.   Genitourinary: Negative.   Musculoskeletal: Negative.   Neurological: Negative.   Psychiatric/Behavioral: Negative.   All other systems reviewed and are negative.      Objective:   Physical Exam  Constitutional: He is oriented to person, place, and time. He appears well-developed and well-nourished.  HENT:  Head: Normocephalic.  Right Ear: External ear normal.  Left Ear: External ear normal.  Nose: Nose normal.  Mouth/Throat: Oropharynx is clear and moist.  Multiple dental caries  Eyes: EOM are normal. Pupils are equal, round, and reactive to light. Left eye discharge: watery.  Neck: Normal range of motion. Neck supple. No JVD present. No thyromegaly present.  Cardiovascular: Normal rate, regular rhythm, normal heart sounds and  intact distal pulses.  Exam reveals no gallop and no friction rub.   No murmur heard. Pulmonary/Chest: Effort normal and breath sounds normal. No respiratory distress. He has no wheezes. He has no rales. He exhibits no tenderness.  Abdominal: Soft. Bowel sounds are normal. He exhibits no mass. There is no tenderness.  Umbilical hernia   Genitourinary:  Refused prostate exam  Musculoskeletal: Normal range of motion. He exhibits edema (2+ pitting edema bil lower ext.).  Lymphadenopathy:    He has no cervical adenopathy.  Neurological: He is alert and oriented to person, place, and time. No cranial nerve deficit.  Skin: Skin is warm and dry.  Psychiatric: He has a normal mood and affect. His behavior is normal. Judgment and thought content normal.   BP 132/82 mmHg  Pulse 88  Temp(Src) 97.2 F (36.2 C) (Oral)  Ht '5\' 11"'  (1.803 m)  Wt 297 lb (134.718 kg)  BMI 41.44 kg/m2       Assessment & Plan:   1. Peripheral neuropathy, idiopathic Wear compression socks - gabapentin (NEURONTIN) 300 MG capsule; Take 2 capsules (600 mg total) by mouth 2 (two) times daily.  Dispense: 120 capsule; Refill: 5  2. Hypokalemia - potassium chloride (K-DUR,KLOR-CON) 10 MEQ tablet; Take 1 tablet (10 mEq total) by mouth 2 (two) times daily.  Dispense: 30 tablet; Refill: 5  3. Hyperlipidemia with target LDL less than 130 Low fat diet - pravastatin (PRAVACHOL) 40 MG tablet; TAKE 1 TABLET (40 MG TOTAL) BY MOUTH DAILY.  Dispense: 30 tablet; Refill: 5 - CMP14+EGFR - NMR, lipoprofile  4. GAD (generalized anxiety disorder) Stress management  5. Depression - sertraline (ZOLOFT) 100  MG tablet; Take 1 tablet (100 mg total) by mouth daily. Take two tablets by mouth at bedtime daily  Dispense: 30 tablet; Refill: 5  6. BMI 40.0-44.9, adult Discussed diet and exercise for person with BMI >25 Will recheck weight in 3-6 months   7. Peripheral edema Elevate legs when sitting - furosemide (LASIX) 20 MG tablet;  TAKE 1 TABLET (20 MG TOTAL) BY MOUTH DAILY.  Dispense: 30 tablet; Refill: 5  8. Insomnia Bedtime ritual - traZODone (DESYREL) 100 MG tablet; Take 1 tablet (100 mg total) by mouth at bedtime.  Dispense: 30 tablet; Refill: 5  9. Prostate cancer screening - PSA, total and free   Refuses all health maintenance Labs pending Health maintenance reviewed Diet and exercise encouraged Continue all meds Follow up  In 3 months   [place], FNP

## 2014-06-08 NOTE — Patient Instructions (Signed)
Peripheral Edema °You have swelling in your legs (peripheral edema). This swelling is due to excess accumulation of salt and water in your body. Edema may be a sign of heart, kidney or liver disease, or a side effect of a medication. It may also be due to problems in the leg veins. Elevating your legs and using special support stockings may be very helpful, if the cause of the swelling is due to poor venous circulation. Avoid long periods of standing, whatever the cause. °Treatment of edema depends on identifying the cause. Chips, pretzels, pickles and other salty foods should be avoided. Restricting salt in your diet is almost always needed. Water pills (diuretics) are often used to remove the excess salt and water from your body via urine. These medicines prevent the kidney from reabsorbing sodium. This increases urine flow. °Diuretic treatment may also result in lowering of potassium levels in your body. Potassium supplements may be needed if you have to use diuretics daily. Daily weights can help you keep track of your progress in clearing your edema. You should call your caregiver for follow up care as recommended. °SEEK IMMEDIATE MEDICAL CARE IF:  °· You have increased swelling, pain, redness, or heat in your legs. °· You develop shortness of breath, especially when lying down. °· You develop chest or abdominal pain, weakness, or fainting. °· You have a fever. °Document Released: 02/01/2004 Document Revised: 03/18/2011 Document Reviewed: 01/11/2009 °[employer]® Patient Information ©2015 [employer]. This information is not intended to replace advice given to you by your health care provider. Make sure you discuss any questions you have with your health care provider. ° °

## 2014-06-09 LAB — CMP14+EGFR
ALBUMIN: 4.4 g/dL (ref 3.6–4.8)
ALK PHOS: 79 IU/L (ref 39–117)
ALT: 25 IU/L (ref 0–44)
AST: 25 IU/L (ref 0–40)
Albumin/Globulin Ratio: 1.9 (ref 1.1–2.5)
BUN / CREAT RATIO: 11 (ref 10–22)
BUN: 12 mg/dL (ref 8–27)
Bilirubin Total: 0.9 mg/dL (ref 0.0–1.2)
CO2: 24 mmol/L (ref 18–29)
CREATININE: 1.07 mg/dL (ref 0.76–1.27)
Calcium: 9.3 mg/dL (ref 8.6–10.2)
Chloride: 101 mmol/L (ref 97–108)
GFR calc Af Amer: 86 mL/min/{1.73_m2} (ref >59)
GFR calc non Af Amer: 74 mL/min/{1.73_m2} (ref >59)
Globulin, Total: 2.3 g/dL (ref 1.5–4.5)
Glucose: 106 mg/dL — ABNORMAL HIGH (ref 65–99)
Potassium: 4.1 mmol/L (ref 3.5–5.2)
SODIUM: 142 mmol/L (ref 134–144)
Total Protein: 6.7 g/dL (ref 6.0–8.5)

## 2014-06-09 LAB — NMR, LIPOPROFILE
CHOLESTEROL: 127 mg/dL (ref 100–199)
HDL CHOLESTEROL BY NMR: 27 mg/dL — AB (ref >39)
HDL Particle Number: 23.9 umol/L — ABNORMAL LOW (ref >=30.5)
LDL Particle Number: 1133 nmol/L — ABNORMAL HIGH (ref <1000)
LDL Size: 20.1 nm (ref >20.5)
LDL-C: 74 mg/dL (ref 0–99)
LP-IR SCORE: 74 — AB (ref <=45)
Small LDL Particle Number: 692 nmol/L — ABNORMAL HIGH (ref <=527)
Triglycerides by NMR: 131 mg/dL (ref 0–149)

## 2014-09-16 ENCOUNTER — Ambulatory Visit (INDEPENDENT_AMBULATORY_CARE_PROVIDER_SITE_OTHER): Payer: Medicaid Other | Admitting: Nurse Practitioner

## 2014-09-16 ENCOUNTER — Encounter: Payer: Self-pay | Admitting: Nurse Practitioner

## 2014-09-16 VITALS — BP 122/79 | HR 76 | Temp 96.8°F | Ht 71.0 in | Wt 299.0 lb

## 2014-09-16 DIAGNOSIS — E876 Hypokalemia: Secondary | ICD-10-CM

## 2014-09-16 DIAGNOSIS — E785 Hyperlipidemia, unspecified: Secondary | ICD-10-CM

## 2014-09-16 DIAGNOSIS — E559 Vitamin D deficiency, unspecified: Secondary | ICD-10-CM

## 2014-09-16 DIAGNOSIS — G4700 Insomnia, unspecified: Secondary | ICD-10-CM

## 2014-09-16 DIAGNOSIS — Z6841 Body Mass Index (BMI) 40.0 and over, adult: Secondary | ICD-10-CM

## 2014-09-16 DIAGNOSIS — R609 Edema, unspecified: Secondary | ICD-10-CM

## 2014-09-16 DIAGNOSIS — F329 Major depressive disorder, single episode, unspecified: Secondary | ICD-10-CM

## 2014-09-16 DIAGNOSIS — F411 Generalized anxiety disorder: Secondary | ICD-10-CM | POA: Diagnosis not present

## 2014-09-16 DIAGNOSIS — F32A Depression, unspecified: Secondary | ICD-10-CM

## 2014-09-16 DIAGNOSIS — G609 Hereditary and idiopathic neuropathy, unspecified: Secondary | ICD-10-CM

## 2014-09-16 MED ORDER — ERGOCALCIFEROL 1.25 MG (50000 UT) PO CAPS: 50000.0000 [IU] | ORAL_CAPSULE | ORAL | Status: DC

## 2014-09-16 MED ORDER — POTASSIUM CHLORIDE CRYS ER 10 MEQ PO TBCR: 10.0000 meq | EXTENDED_RELEASE_TABLET | Freq: Two times a day (BID) | ORAL | Status: DC

## 2014-09-16 MED ORDER — FUROSEMIDE 20 MG PO TABS: ORAL_TABLET | ORAL | Status: DC

## 2014-09-16 MED ORDER — ATORVASTATIN CALCIUM 40 MG PO TABS: 40.0000 mg | ORAL_TABLET | Freq: Every day | ORAL | Status: DC

## 2014-09-16 MED ORDER — SERTRALINE HCL 100 MG PO TABS: 100.0000 mg | ORAL_TABLET | Freq: Every day | ORAL | Status: DC

## 2014-09-16 MED ORDER — TRAZODONE HCL 100 MG PO TABS: 100.0000 mg | ORAL_TABLET | Freq: Every day | ORAL | Status: DC

## 2014-09-16 MED ORDER — GABAPENTIN 300 MG PO CAPS: 600.0000 mg | ORAL_CAPSULE | Freq: Two times a day (BID) | ORAL | Status: DC

## 2014-09-16 NOTE — Patient Instructions (Signed)

## 2014-09-16 NOTE — Progress Notes (Signed)
Subjective:    Patient ID: Kent Bryant, male    DOB: November 21, 1950, 64 y.o.   MRN: [id number]  Patient in today for follow up of chronic medical follow up-  He is unsure of the meds he is on so difficult to assess.  Hyperlipidemia This is a chronic problem. The current episode started more than 1 year ago. The problem is uncontrolled. Recent lipid tests were reviewed and are high. Current antihyperlipidemic treatment includes statins. The current treatment provides moderate improvement of lipids. Compliance problems include adherence to diet and adherence to exercise.  Risk factors for coronary artery disease include dyslipidemia, family history, male sex and obesity.  Depression/GAD zoloft and xanax - combination working well for him- no side effects Peripheral neuropathy Gabapentin- works well but still has burning sensation at times Vitamin d deficiency Vitamin d 50,000 u weekly- no c/o side effects insomnia trazadone nightly works well- feels rest in AM. Hypokalemia K dur daily.   *Review of Systems  HENT: Positive for congestion.   Eyes: Negative.   Respiratory: Positive for cough.   Cardiovascular: Negative.   Gastrointestinal: Negative.   Genitourinary: Negative.   Musculoskeletal: Negative.   Neurological: Negative.   Psychiatric/Behavioral: Negative.   All other systems reviewed and are negative.      Objective:   Physical Exam  Constitutional: He is oriented to person, place, and time. He appears well-developed and well-nourished.  HENT:  Head: Normocephalic.  Right Ear: External ear normal.  Left Ear: External ear normal.  Nose: Nose normal.  Mouth/Throat: Oropharynx is clear and moist.  Multiple dental caries  Eyes: EOM are normal. Pupils are equal, round, and reactive to light. Left eye discharge: watery.  Neck: Normal range of motion. Neck supple. No JVD present. No thyromegaly present.  Cardiovascular: Normal rate, regular rhythm, normal heart sounds and  intact distal pulses.  Exam reveals no gallop and no friction rub.   No murmur heard. Pulmonary/Chest: Effort normal and breath sounds normal. No respiratory distress. He has no wheezes. He has no rales. He exhibits no tenderness.  Abdominal: Soft. Bowel sounds are normal. He exhibits no mass. There is no tenderness.  Umbilical hernia- non strangulated   Genitourinary:  Refused prostate exam  Musculoskeletal: Normal range of motion. He exhibits edema (2+ pitting edema bil lower ext.).  Lymphadenopathy:    He has no cervical adenopathy.  Neurological: He is alert and oriented to person, place, and time. No cranial nerve deficit.  Skin: Skin is warm and dry.  Psychiatric: He has a normal mood and affect. His behavior is normal. Judgment and thought content normal.   BP 122/79 mmHg  Pulse 76  Temp(Src) 96.8 F (36 C) (Oral)  Ht '5\' 11"'  (1.803 m)  Wt 299 lb (135.626 kg)  BMI 41.72 kg/m2       Assessment & Plan:  1. Peripheral neuropathy, idiopathic Do not go barefooted due to decrease feeling in feet - gabapentin (NEURONTIN) 300 MG capsule; Take 2 capsules (600 mg total) by mouth 2 (two) times daily.  Dispense: 120 capsule; Refill: 5  2. Depression Stress management - sertraline (ZOLOFT) 100 MG tablet; Take 1 tablet (100 mg total) by mouth daily. Take two tablets by mouth at bedtime daily  Dispense: 30 tablet; Refill: 5  3. GAD (generalized anxiety disorder)  4. Hyperlipidemia with target LDL less than 130 Low fat diet - atorvastatin (LIPITOR) 40 MG tablet; Take 1 tablet (40 mg total) by mouth daily.  Dispense: 30 tablet; Refill: 5 -  CMP14+EGFR - Lipid panel  5. BMI 40.0-44.9, adult Discussed diet and exercise for person with BMI >25 Will recheck weight in 3-6 months   6. Hypokalemia - potassium chloride (K-DUR,KLOR-CON) 10 MEQ tablet; Take 1 tablet (10 mEq total) by mouth 2 (two) times daily.  Dispense: 30 tablet; Refill: 5  7. Peripheral edema Elevated legs when  sitting - furosemide (LASIX) 20 MG tablet; TAKE 1 TABLET (20 MG TOTAL) BY MOUTH DAILY.  Dispense: 30 tablet; Refill: 5  8. Insomnia Bedtime ritual - traZODone (DESYREL) 100 MG tablet; Take 1 tablet (100 mg total) by mouth at bedtime.  Dispense: 30 tablet; Refill: 5  9. Vitamin D deficiency - ergocalciferol (VITAMIN D2) 50000 UNITS capsule; Take 1 capsule (50,000 Units total) by mouth once a week.  Dispense: 4 capsule; Refill: 5 - Vit D  25 hydroxy (rtn osteoporosis monitoring)    Labs pending Health maintenance reviewed- refuses all health maintenance Diet and exercise encouraged Continue all meds Follow up  In 3 month   [place], FNP

## 2014-09-17 LAB — LIPID PANEL
CHOLESTEROL TOTAL: 153 mg/dL (ref 100–199)
Chol/HDL Ratio: 4.6 ratio units (ref 0.0–5.0)
HDL: 33 mg/dL — ABNORMAL LOW (ref >39)
LDL CALC: 97 mg/dL (ref 0–99)
TRIGLYCERIDES: 115 mg/dL (ref 0–149)
VLDL CHOLESTEROL CAL: 23 mg/dL (ref 5–40)

## 2014-09-17 LAB — CMP14+EGFR
A/G RATIO: 2 (ref 1.1–2.5)
ALT: 22 IU/L (ref 0–44)
AST: 22 IU/L (ref 0–40)
Albumin: 4.3 g/dL (ref 3.6–4.8)
Alkaline Phosphatase: 79 IU/L (ref 39–117)
BILIRUBIN TOTAL: 0.7 mg/dL (ref 0.0–1.2)
BUN/Creatinine Ratio: 9 — ABNORMAL LOW (ref 10–22)
BUN: 10 mg/dL (ref 8–27)
CALCIUM: 9.2 mg/dL (ref 8.6–10.2)
CHLORIDE: 101 mmol/L (ref 97–108)
CO2: 24 mmol/L (ref 18–29)
Creatinine, Ser: 1.1 mg/dL (ref 0.76–1.27)
GFR calc Af Amer: 83 mL/min/{1.73_m2} (ref >59)
GFR, EST NON AFRICAN AMERICAN: 72 mL/min/{1.73_m2} (ref >59)
GLOBULIN, TOTAL: 2.1 g/dL (ref 1.5–4.5)
Glucose: 108 mg/dL — ABNORMAL HIGH (ref 65–99)
POTASSIUM: 4.4 mmol/L (ref 3.5–5.2)
SODIUM: 142 mmol/L (ref 134–144)
Total Protein: 6.4 g/dL (ref 6.0–8.5)

## 2014-09-17 LAB — VITAMIN D 25 HYDROXY (VIT D DEFICIENCY, FRACTURES): Vit D, 25-Hydroxy: 22.8 ng/mL — ABNORMAL LOW (ref 30.0–100.0)

## 2014-12-19 ENCOUNTER — Ambulatory Visit (INDEPENDENT_AMBULATORY_CARE_PROVIDER_SITE_OTHER): Payer: Medicaid Other | Admitting: Nurse Practitioner

## 2014-12-19 ENCOUNTER — Encounter: Payer: Self-pay | Admitting: Nurse Practitioner

## 2014-12-19 ENCOUNTER — Ambulatory Visit (INDEPENDENT_AMBULATORY_CARE_PROVIDER_SITE_OTHER): Payer: Medicaid Other

## 2014-12-19 VITALS — BP 133/75 | HR 75 | Temp 97.2°F | Ht 71.0 in | Wt 307.2 lb

## 2014-12-19 DIAGNOSIS — F17200 Nicotine dependence, unspecified, uncomplicated: Secondary | ICD-10-CM

## 2014-12-19 DIAGNOSIS — Z6841 Body Mass Index (BMI) 40.0 and over, adult: Secondary | ICD-10-CM

## 2014-12-19 DIAGNOSIS — G4700 Insomnia, unspecified: Secondary | ICD-10-CM | POA: Diagnosis not present

## 2014-12-19 DIAGNOSIS — Z1159 Encounter for screening for other viral diseases: Secondary | ICD-10-CM

## 2014-12-19 DIAGNOSIS — F411 Generalized anxiety disorder: Secondary | ICD-10-CM | POA: Diagnosis not present

## 2014-12-19 DIAGNOSIS — R600 Localized edema: Secondary | ICD-10-CM

## 2014-12-19 DIAGNOSIS — R609 Edema, unspecified: Secondary | ICD-10-CM

## 2014-12-19 DIAGNOSIS — E785 Hyperlipidemia, unspecified: Secondary | ICD-10-CM

## 2014-12-19 DIAGNOSIS — G609 Hereditary and idiopathic neuropathy, unspecified: Secondary | ICD-10-CM

## 2014-12-19 DIAGNOSIS — F32A Depression, unspecified: Secondary | ICD-10-CM

## 2014-12-19 DIAGNOSIS — E876 Hypokalemia: Secondary | ICD-10-CM

## 2014-12-19 DIAGNOSIS — Z1212 Encounter for screening for malignant neoplasm of rectum: Secondary | ICD-10-CM

## 2014-12-19 DIAGNOSIS — F329 Major depressive disorder, single episode, unspecified: Secondary | ICD-10-CM

## 2014-12-19 DIAGNOSIS — Z720 Tobacco use: Secondary | ICD-10-CM

## 2014-12-19 MED ORDER — SERTRALINE HCL 100 MG PO TABS: 100.0000 mg | ORAL_TABLET | Freq: Every day | ORAL | Status: DC

## 2014-12-19 MED ORDER — ATORVASTATIN CALCIUM 40 MG PO TABS: 40.0000 mg | ORAL_TABLET | Freq: Every day | ORAL | Status: DC

## 2014-12-19 MED ORDER — POTASSIUM CHLORIDE CRYS ER 10 MEQ PO TBCR: 10.0000 meq | EXTENDED_RELEASE_TABLET | Freq: Two times a day (BID) | ORAL | Status: DC

## 2014-12-19 MED ORDER — GABAPENTIN 300 MG PO CAPS: 600.0000 mg | ORAL_CAPSULE | Freq: Two times a day (BID) | ORAL | Status: DC

## 2014-12-19 MED ORDER — TRAZODONE HCL 100 MG PO TABS: 100.0000 mg | ORAL_TABLET | Freq: Every day | ORAL | Status: DC

## 2014-12-19 MED ORDER — FUROSEMIDE 20 MG PO TABS: ORAL_TABLET | ORAL | Status: DC

## 2014-12-19 NOTE — Patient Instructions (Signed)

## 2014-12-19 NOTE — Progress Notes (Signed)
Subjective:    Patient ID: Kent Bryant, male    DOB: 1950/02/24, 64 y.o.   MRN: [id number]  Patient in today for follow up of chronic medical follow up- No complaints today.   Hyperlipidemia This is a chronic problem. The current episode started more than 1 year ago. The problem is uncontrolled. Recent lipid tests were reviewed and are variable. Current antihyperlipidemic treatment includes statins. The current treatment provides moderate improvement of lipids. Compliance problems include adherence to diet and adherence to exercise.  Risk factors for coronary artery disease include dyslipidemia.  Depression/GAD zoloft and xanax - combination working well for him- no side effects Peripheral neuropathy Gabapentin- works well but still has burning sensation at times Vitamin d deficiency Vitamin d 50,000 u weekly- no c/o side effects Peripheral edema Right leg worse then left- takes lasix which helps and he also wears compression hose. Denies any pain.  *Review of Systems  HENT: Positive for congestion.   Eyes: Negative.   Respiratory: Positive for cough.   Cardiovascular: Negative.   Gastrointestinal: Negative.   Genitourinary: Negative.   Musculoskeletal: Negative.   Neurological: Negative.   Psychiatric/Behavioral: Negative.   All other systems reviewed and are negative.      Objective:   Physical Exam  Constitutional: He is oriented to person, place, and time. He appears well-developed and well-nourished.  HENT:  Head: Normocephalic.  Right Ear: External ear normal.  Left Ear: External ear normal.  Nose: Nose normal.  Mouth/Throat: Oropharynx is clear and moist.  Multiple dental caries  Eyes: EOM are normal. Pupils are equal, round, and reactive to light. Left eye discharge: watery.  Neck: Normal range of motion. Neck supple. No JVD present. No thyromegaly present.  Cardiovascular: Normal rate, regular rhythm, normal heart sounds and intact distal pulses.  Exam reveals no  gallop and no friction rub.   No murmur heard. Pulmonary/Chest: Effort normal and breath sounds normal. No respiratory distress. He has no wheezes. He has no rales. He exhibits no tenderness.  Abdominal: Soft. Bowel sounds are normal. He exhibits no mass. There is no tenderness.  Genitourinary:  Refused prostate exam  Musculoskeletal: Normal range of motion. He exhibits edema (2+ pitting edema bil lower ext.).  Lymphadenopathy:    He has no cervical adenopathy.  Neurological: He is alert and oriented to person, place, and time. No cranial nerve deficit.  Skin: Skin is warm and dry.  Psychiatric: He has a normal mood and affect. His behavior is normal. Judgment and thought content normal.     BP 133/75 mmHg  Pulse 75  Temp(Src) 97.2 F (36.2 C) (Oral)  Ht 5' 11" (1.803 m)  Wt 307 lb 4 oz (139.368 kg)  BMI 42.87 kg/m2       Chest x ray- chronic bronchitic changes-Preliminary reading by Ronnald Collum, FNP  [facility]  EKG- NSR unchanged from Larae Grooms, FNP  Assessment & Plan:  1. Peripheral neuropathy, idiopathic Do ont go barefooted - gabapentin (NEURONTIN) 300 MG capsule; Take 2 capsules (600 mg total) by mouth 2 (two) times daily.  Dispense: 120 capsule; Refill: 5  2. Depression Stress mangement - sertraline (ZOLOFT) 100 MG tablet; Take 1 tablet (100 mg total) by mouth daily. Take two tablets by mouth at bedtime daily  Dispense: 30 tablet; Refill: 5  3. GAD (generalized anxiety disorder)  4. Hyperlipidemia with target LDL less than 130 Low fat diet - atorvastatin (LIPITOR) 40 MG tablet; Take 1 tablet (40 mg total) by mouth daily.  Dispense:  30 tablet; Refill: 5 - CMP14+EGFR - Lipid panel - EKG 12-Lead  5. BMI 40.0-44.9, adult ([facility]) Discussed diet and exercise for person with BMI >25 Will recheck weight in 3-6 months  6. Hypokalemia - potassium chloride (K-DUR,KLOR-CON) 10 MEQ tablet; Take 1 tablet (10 mEq total) by mouth 2 (two) times daily.  Dispense:  30 tablet; Refill: 5  7. Peripheral edema elevate legs when sitting - furosemide (LASIX) 20 MG tablet; TAKE 1 TABLET (20 MG TOTAL) BY MOUTH DAILY.  Dispense: 30 tablet; Refill: 5  8. Insomnia Bedtime ritual - traZODone (DESYREL) 100 MG tablet; Take 1 tablet (100 mg total) by mouth at bedtime.  Dispense: 30 tablet; Refill: 5  9. Screening for malignant neoplasm of the rectum - Fecal occult blood, imunochemical; Future  10. Smoker - DG Chest 2 View; Future  11. Need for hepatitis C screening test - Hepatitis C antibody    Labs pending Health maintenance reviewed Diet and exercise encouraged Continue all meds Follow up  In 3 month   Mary-Margaret Martin, FNP     

## 2014-12-20 LAB — CMP14+EGFR
ALBUMIN: 4.3 g/dL (ref 3.6–4.8)
ALK PHOS: 103 IU/L (ref 39–117)
ALT: 24 IU/L (ref 0–44)
AST: 23 IU/L (ref 0–40)
Albumin/Globulin Ratio: 1.8 (ref 1.1–2.5)
BILIRUBIN TOTAL: 0.7 mg/dL (ref 0.0–1.2)
BUN / CREAT RATIO: 10 (ref 10–22)
BUN: 10 mg/dL (ref 8–27)
CO2: 22 mmol/L (ref 18–29)
CREATININE: 1.02 mg/dL (ref 0.76–1.27)
Calcium: 9.2 mg/dL (ref 8.6–10.2)
Chloride: 100 mmol/L (ref 96–106)
GFR calc non Af Amer: 78 mL/min/{1.73_m2} (ref >59)
GFR, EST AFRICAN AMERICAN: 90 mL/min/{1.73_m2} (ref >59)
GLOBULIN, TOTAL: 2.4 g/dL (ref 1.5–4.5)
Glucose: 110 mg/dL — ABNORMAL HIGH (ref 65–99)
Potassium: 4.4 mmol/L (ref 3.5–5.2)
SODIUM: 143 mmol/L (ref 134–144)
Total Protein: 6.7 g/dL (ref 6.0–8.5)

## 2014-12-20 LAB — LIPID PANEL
CHOL/HDL RATIO: 4.8 ratio (ref 0.0–5.0)
CHOLESTEROL TOTAL: 143 mg/dL (ref 100–199)
HDL: 30 mg/dL — ABNORMAL LOW (ref >39)
LDL CALC: 83 mg/dL (ref 0–99)
Triglycerides: 150 mg/dL — ABNORMAL HIGH (ref 0–149)
VLDL Cholesterol Cal: 30 mg/dL (ref 5–40)

## 2014-12-20 LAB — HEPATITIS C ANTIBODY: Hep C Virus Ab: 0.1 s/co ratio (ref 0.0–0.9)

## 2015-03-21 ENCOUNTER — Ambulatory Visit (INDEPENDENT_AMBULATORY_CARE_PROVIDER_SITE_OTHER): Payer: Medicaid Other | Admitting: Nurse Practitioner

## 2015-03-21 ENCOUNTER — Encounter: Payer: Self-pay | Admitting: Nurse Practitioner

## 2015-03-21 VITALS — BP 132/77 | HR 85 | Temp 98.0°F | Ht 71.0 in | Wt 304.4 lb

## 2015-03-21 DIAGNOSIS — F32A Depression, unspecified: Secondary | ICD-10-CM

## 2015-03-21 DIAGNOSIS — E876 Hypokalemia: Secondary | ICD-10-CM | POA: Diagnosis not present

## 2015-03-21 DIAGNOSIS — G4700 Insomnia, unspecified: Secondary | ICD-10-CM | POA: Diagnosis not present

## 2015-03-21 DIAGNOSIS — E785 Hyperlipidemia, unspecified: Secondary | ICD-10-CM | POA: Diagnosis not present

## 2015-03-21 DIAGNOSIS — Z6841 Body Mass Index (BMI) 40.0 and over, adult: Secondary | ICD-10-CM | POA: Diagnosis not present

## 2015-03-21 DIAGNOSIS — R609 Edema, unspecified: Secondary | ICD-10-CM | POA: Diagnosis not present

## 2015-03-21 DIAGNOSIS — F329 Major depressive disorder, single episode, unspecified: Secondary | ICD-10-CM

## 2015-03-21 DIAGNOSIS — F411 Generalized anxiety disorder: Secondary | ICD-10-CM

## 2015-03-21 NOTE — Progress Notes (Signed)
Subjective:    Patient ID: Kent Bryant, male    DOB: 01/10/1950, 65 y.o.   MRN: [id number]  HPI Patient here today for follow up of chronic medical problems, he has no new complaint today.  Outpatient Encounter Prescriptions as of 03/21/2015  Medication Sig  . atorvastatin (LIPITOR) 40 MG tablet Take 1 tablet (40 mg total) by mouth daily.  . ergocalciferol (VITAMIN D2) 50000 UNITS capsule Take 1 capsule (50,000 Units total) by mouth once a week.  . furosemide (LASIX) 20 MG tablet TAKE 1 TABLET (20 MG TOTAL) BY MOUTH DAILY.  Marland Kitchen gabapentin (NEURONTIN) 300 MG capsule Take 2 capsules (600 mg total) by mouth 2 (two) times daily.  . potassium chloride (K-DUR,KLOR-CON) 10 MEQ tablet Take 1 tablet (10 mEq total) by mouth 2 (two) times daily.  . prednisoLONE acetate (PRED FORTE) 1 % ophthalmic suspension Place 1 drop into both eyes 4 (four) times daily.  . sertraline (ZOLOFT) 100 MG tablet Take 1 tablet (100 mg total) by mouth daily.  . traZODone (DESYREL) 100 MG tablet Take 1 tablet (100 mg total) by mouth at bedtime.   No facility-administered encounter medications on file as of 03/21/2015.    Review of Systems HENT: negative for congestion  Eyes: Negative.  Respiratory: Positive for occasional shortness of activity  Cardiovascular: Negative.  Gastrointestinal: Negative.  Genitourinary: Negative.  Musculoskeletal: Negative.  Neurological: Negative.  Psychiatric/Behavioral: Negative.  All other systems reviewed and are negative    Objective:   Physical Exam  Constitutional: He is oriented to person, place, and time. He appears well-developed and well-nourished.  Skin: warm and dry HENT:  Head: Normocephalic.  Right Ear: External ear normal.  Left Ear: External ear normal.  Nose: Nose normal.  Mouth/Throat: Oropharynx is clear and moist.  Multiple dental caries  Eyes: EOM are normal. Pupils are equal, round, and reactive to light. Left eye discharge: watery.  Neck:  Normal range of motion. Neck supple. No JVD present. No thyromegaly present.  Cardiovascular: Normal rate, regular rhythm, normal heart sounds and intact distal pulses. Exam reveals no gallop and no friction rub.  No murmur heard. Pulmonary/Chest: Effort normal and breath sounds normal. No respiratory distress. He has no wheezes. He has no rales. He exhibits no tenderness.  Abdominal: Soft. Bowel sounds are normal. He exhibits no mass. There is no tenderness.  Musculoskeletal: Normal range of motion. He exhibits non pitting edema Lymphadenopathy:   He has no cervical adenopathy.  Neurological: He is alert and oriented to person, place, and time. No cranial nerve deficit.  Skin: Skin is warm and dry.  Psychiatric: He has a normal mood and affect. His behavior is normal. Judgment and thought content normal.   BP 132/77 mmHg  Pulse 85  Temp(Src) 98 F (36.7 C) (Oral)  Ht '5\' 11"'  (1.803 m)  Wt 304 lb 6.4 oz (138.075 kg)  BMI 42.47 kg/m2     Assessment & Plan:  1. Depression Stress management Continue Zoloft, take 1 tablet (100 mg total) by mouth daily,   2. GAD (generalized anxiety disorder)  3. Hyperlipidemia with target LDL less than 130 Continue low fat diet Continue atorvastatin (LIPITOR) 40 MG tablet; Take 1 tablet (40 mg total) by mouth daily - CMP14+EGFR - Lipid panel  4. BMI 40.0-44.9, adult ([place]) Discussed diet and exercise for person with BMI >25 Will recheck weight in 3-6 months  5. Hypokalemia Continue potassium chloride (K-DUR,KLOR-CON) 10 MEQ tablet; Take 1 tablet (10 mEq total) by mouth 2 (two)  times daily.   6. Peripheral edema   Elevate legs when sitting Continue furosemide (LASIX) 20 MG tablet; TAKE 1 TABLET (20 MG TOTAL) BY MOUTH DAILY.   7. Insomnia Maintain healthy bedtime ritual  continue traZODone (DESYREL) 100 MG tablet; Take 1 tablet (100 mg total) by mouth at bedtime.   Hemoccult cards given to patient with directions Labs pending Health  maintenance reviewed Diet and exercise encouraged Continue all meds Follow up  In 3 months   [place], FNP

## 2015-03-21 NOTE — Patient Instructions (Addendum)
Thank you for allowing [place] to care for you today. We strive to provide exceptional quality and compassionate care. Please let us know how we are doing and how we can help serve you better by filling out the survey that you receive from [facility].    Smoking Cessation, Tips for Success If you are ready to quit smoking, congratulations! You have chosen to help yourself be healthier. Cigarettes bring nicotine, tar, carbon monoxide, and other irritants into your body. Your lungs, heart, and blood vessels will be able to work better without these poisons. There are many different ways to quit smoking. Nicotine gum, nicotine patches, a nicotine inhaler, or nicotine nasal spray can help with physical craving. Hypnosis, support groups, and medicines help break the habit of smoking. WHAT THINGS CAN I DO TO MAKE QUITTING EASIER?  Here are some tips to help you quit for good:  Pick a date when you will quit smoking completely. Tell all of your friends and family about your plan to quit on that date.  Do not try to slowly cut down on the number of cigarettes you are smoking. Pick a quit date and quit smoking completely starting on that day.  Throw away all cigarettes.   Clean and remove all ashtrays from your home, work, and car.  On a card, write down your reasons for quitting. Carry the card with you and read it when you get the urge to smoke.  Cleanse your body of nicotine. Drink enough water and fluids to keep your urine clear or pale yellow. Do this after quitting to flush the nicotine from your body.  Learn to predict your moods. Do not let a bad situation be your excuse to have a cigarette. Some situations in your life might tempt you into wanting a cigarette.  Never have "just one" cigarette. It leads to wanting another and another. Remind yourself of your decision to quit.  Change habits associated with smoking. If you smoked while driving or when feeling stressed, try other activities to  replace smoking. Stand up when drinking your coffee. Brush your teeth after eating. Sit in a different chair when you read the paper. Avoid alcohol while trying to quit, and try to drink fewer caffeinated beverages. Alcohol and caffeine may urge you to smoke.  Avoid foods and drinks that can trigger a desire to smoke, such as sugary or spicy foods and alcohol.  Ask people who smoke not to smoke around you.  Have something planned to do right after eating or having a cup of coffee. For example, plan to take a walk or exercise.  Try a relaxation exercise to calm you down and decrease your stress. Remember, you may be tense and nervous for the first 2 weeks after you quit, but this will pass.  Find new activities to keep your hands busy. Play with a pen, coin, or rubber band. Doodle or draw things on paper.  Brush your teeth right after eating. This will help cut down on the craving for the taste of tobacco after meals. You can also try mouthwash.   Use oral substitutes in place of cigarettes. Try using lemon drops, carrots, cinnamon sticks, or chewing gum. Keep them handy so they are available when you have the urge to smoke.  When you have the urge to smoke, try deep breathing.  Designate your home as a nonsmoking area.  If you are a heavy smoker, ask your health care provider about a prescription for nicotine chewing gum.  It can ease your withdrawal from nicotine.  Reward yourself. Set aside the cigarette money you save and buy yourself something nice.  Look for support from others. Join a support group or smoking cessation program. Ask someone at home or at work to help you with your plan to quit smoking.  Always ask yourself, "Do I need this cigarette or is this just a reflex?" Tell yourself, "Today, I choose not to smoke," or "I do not want to smoke." You are reminding yourself of your decision to quit.  Do not replace cigarette smoking with electronic cigarettes (commonly called  e-cigarettes). The safety of e-cigarettes is unknown, and some may contain harmful chemicals.  If you relapse, do not give up! Plan ahead and think about what you will do the next time you get the urge to smoke. HOW WILL I FEEL WHEN I QUIT SMOKING? You may have symptoms of withdrawal because your body is used to nicotine (the addictive substance in cigarettes). You may crave cigarettes, be irritable, feel very hungry, cough often, get headaches, or have difficulty concentrating. The withdrawal symptoms are only temporary. They are strongest when you first quit but will go away within 10-14 days. When withdrawal symptoms occur, stay in control. Think about your reasons for quitting. Remind yourself that these are signs that your body is healing and getting used to being without cigarettes. Remember that withdrawal symptoms are easier to treat than the major diseases that smoking can cause.  Even after the withdrawal is over, expect periodic urges to smoke. However, these cravings are generally short lived and will go away whether you smoke or not. Do not smoke! WHAT RESOURCES ARE AVAILABLE TO HELP ME QUIT SMOKING? Your health care provider can direct you to community resources or hospitals for support, which may include:  Group support.  Education.  Hypnosis.  Therapy.   This information is not intended to replace advice given to you by your health care provider. Make sure you discuss any questions you have with your health care provider.   Document Released: 09/22/2003 Document Revised: 01/14/2014 Document Reviewed: 06/11/2012 Elsevier Interactive Patient Education [employer].

## 2015-03-22 LAB — CMP14+EGFR
ALBUMIN: 4.2 g/dL (ref 3.6–4.8)
ALT: 20 IU/L (ref 0–44)
AST: 24 IU/L (ref 0–40)
Albumin/Globulin Ratio: 1.9 (ref 1.2–2.2)
Alkaline Phosphatase: 97 IU/L (ref 39–117)
BILIRUBIN TOTAL: 1 mg/dL (ref 0.0–1.2)
BUN / CREAT RATIO: 9 — AB (ref 10–22)
BUN: 10 mg/dL (ref 8–27)
CHLORIDE: 99 mmol/L (ref 96–106)
CO2: 26 mmol/L (ref 18–29)
Calcium: 9.2 mg/dL (ref 8.6–10.2)
Creatinine, Ser: 1.06 mg/dL (ref 0.76–1.27)
GFR calc non Af Amer: 74 mL/min/{1.73_m2} (ref >59)
GFR, EST AFRICAN AMERICAN: 86 mL/min/{1.73_m2} (ref >59)
GLUCOSE: 106 mg/dL — AB (ref 65–99)
Globulin, Total: 2.2 g/dL (ref 1.5–4.5)
Potassium: 4.3 mmol/L (ref 3.5–5.2)
Sodium: 141 mmol/L (ref 134–144)
TOTAL PROTEIN: 6.4 g/dL (ref 6.0–8.5)

## 2015-03-22 LAB — LIPID PANEL
Chol/HDL Ratio: 4.5 ratio units (ref 0.0–5.0)
Cholesterol, Total: 116 mg/dL (ref 100–199)
HDL: 26 mg/dL — ABNORMAL LOW (ref >39)
LDL CALC: 66 mg/dL (ref 0–99)
Triglycerides: 120 mg/dL (ref 0–149)
VLDL CHOLESTEROL CAL: 24 mg/dL (ref 5–40)

## 2015-05-05 ENCOUNTER — Ambulatory Visit (INDEPENDENT_AMBULATORY_CARE_PROVIDER_SITE_OTHER): Payer: Medicaid Other | Admitting: Family Medicine

## 2015-05-05 ENCOUNTER — Encounter: Payer: Self-pay | Admitting: Family Medicine

## 2015-05-05 ENCOUNTER — Ambulatory Visit (HOSPITAL_COMMUNITY)
Admission: RE | Admit: 2015-05-05 | Discharge: 2015-05-05 | Disposition: A | Discharge status: Home / Self Care | Payer: Medicaid Other | Source: Ambulatory Visit | Attending: Family Medicine | Admitting: Family Medicine

## 2015-05-05 ENCOUNTER — Other Ambulatory Visit: Payer: Self-pay

## 2015-05-05 VITALS — BP 143/81 | HR 75 | Temp 97.2°F | Ht 71.0 in | Wt 309.4 lb

## 2015-05-05 DIAGNOSIS — R600 Localized edema: Secondary | ICD-10-CM | POA: Diagnosis not present

## 2015-05-05 DIAGNOSIS — M79604 Pain in right leg: Secondary | ICD-10-CM | POA: Diagnosis present

## 2015-05-05 DIAGNOSIS — R609 Edema, unspecified: Secondary | ICD-10-CM | POA: Diagnosis not present

## 2015-05-05 NOTE — Progress Notes (Signed)
   Subjective:    Patient ID: Kent Bryant, male    DOB: [birth year], 65 y.o.   MRN: [id number]  HPI 65 year old gentleman with a 2-3 day history of swelling redness and blistering on his right lower leg. He has a history of neuropathy apparently. He takes gabapentin 600 mg twice a day. The swelling does improve after he's slept at night.  Patient Active Problem List   Diagnosis Date Noted  . Peripheral edema 03/03/2014  . Insomnia 03/03/2014  . Hypokalemia 10/21/2013  . BMI 40.0-44.9, adult ([place]) 06/28/2013  . Depression 12/16/2012  . GAD (generalized anxiety disorder) 12/16/2012  . Peripheral neuropathy, idiopathic 12/16/2012  . Hyperlipidemia with target LDL less than 130 12/16/2012   Outpatient Encounter Prescriptions as of 05/05/2015  Medication Sig  . atorvastatin (LIPITOR) 40 MG tablet Take 1 tablet (40 mg total) by mouth daily.  . ergocalciferol (VITAMIN D2) 50000 UNITS capsule Take 1 capsule (50,000 Units total) by mouth once a week.  . furosemide (LASIX) 20 MG tablet TAKE 1 TABLET (20 MG TOTAL) BY MOUTH DAILY.  Marland Kitchen gabapentin (NEURONTIN) 300 MG capsule Take 2 capsules (600 mg total) by mouth 2 (two) times daily.  . potassium chloride (K-DUR,KLOR-CON) 10 MEQ tablet Take 1 tablet (10 mEq total) by mouth 2 (two) times daily.  . prednisoLONE acetate (PRED FORTE) 1 % ophthalmic suspension Place 1 drop into both eyes 4 (four) times daily.  . sertraline (ZOLOFT) 100 MG tablet Take 1 tablet (100 mg total) by mouth daily.  . traZODone (DESYREL) 100 MG tablet Take 1 tablet (100 mg total) by mouth at bedtime.   No facility-administered encounter medications on file as of 05/05/2015.      Review of Systems  Constitutional: Negative.   Respiratory: Negative.   Cardiovascular: Positive for leg swelling.  Neurological: Negative.   Psychiatric/Behavioral: Negative.        Objective:   Physical Exam  Constitutional: He appears well-developed and well-nourished.  Pulmonary/Chest: He is in  respiratory distress.  Musculoskeletal: He exhibits edema.  Right leg there is 4+ edema in the lower leg with what appears to be stasis dermatitis. The edema is firm and nonpitting.          Assessment & Plan:  1. Edema, unspecified type Need to rule out DVT. With unilateral edema. If not DVT than likely venous stasis disease. I have increased his furosemide from 20-40 mg a day for 3 days, instructed him to elevate his leg whenever possible, increased gabapentin from 1200-1800 mg a day for the burning in his feet, and also suggested fibro-cream for symptomatic relief of burning. We'll check him back after Doppler ultrasound completed - US Venous Img Lower Unilateral Right; Future  2. Peripheral edema See above for plan  Wardell Honour MD

## 2015-05-08 NOTE — Progress Notes (Signed)
Patient aware.

## 2015-05-15 ENCOUNTER — Telehealth: Payer: Self-pay | Admitting: Nurse Practitioner

## 2015-05-15 DIAGNOSIS — R609 Edema, unspecified: Secondary | ICD-10-CM

## 2015-05-15 MED ORDER — FUROSEMIDE 20 MG PO TABS: ORAL_TABLET | ORAL | Status: DC

## 2015-05-15 NOTE — Telephone Encounter (Signed)
Medication sent to pharmacy  

## 2015-06-26 ENCOUNTER — Other Ambulatory Visit: Payer: Self-pay | Admitting: Nurse Practitioner

## 2015-07-10 ENCOUNTER — Ambulatory Visit (INDEPENDENT_AMBULATORY_CARE_PROVIDER_SITE_OTHER): Payer: Medicaid Other | Admitting: Nurse Practitioner

## 2015-07-10 ENCOUNTER — Encounter: Payer: Self-pay | Admitting: Nurse Practitioner

## 2015-07-10 VITALS — BP 132/76 | HR 74 | Temp 96.9°F | Ht 71.0 in | Wt 310.0 lb

## 2015-07-10 DIAGNOSIS — Z6841 Body Mass Index (BMI) 40.0 and over, adult: Secondary | ICD-10-CM

## 2015-07-10 DIAGNOSIS — Z1212 Encounter for screening for malignant neoplasm of rectum: Secondary | ICD-10-CM

## 2015-07-10 DIAGNOSIS — F32A Depression, unspecified: Secondary | ICD-10-CM

## 2015-07-10 DIAGNOSIS — G4700 Insomnia, unspecified: Secondary | ICD-10-CM

## 2015-07-10 DIAGNOSIS — R600 Localized edema: Secondary | ICD-10-CM

## 2015-07-10 DIAGNOSIS — G609 Hereditary and idiopathic neuropathy, unspecified: Secondary | ICD-10-CM

## 2015-07-10 DIAGNOSIS — R609 Edema, unspecified: Secondary | ICD-10-CM

## 2015-07-10 DIAGNOSIS — F329 Major depressive disorder, single episode, unspecified: Secondary | ICD-10-CM | POA: Diagnosis not present

## 2015-07-10 DIAGNOSIS — F411 Generalized anxiety disorder: Secondary | ICD-10-CM | POA: Diagnosis not present

## 2015-07-10 DIAGNOSIS — E785 Hyperlipidemia, unspecified: Secondary | ICD-10-CM

## 2015-07-10 DIAGNOSIS — E876 Hypokalemia: Secondary | ICD-10-CM | POA: Diagnosis not present

## 2015-07-10 MED ORDER — GABAPENTIN 300 MG PO CAPS: 600.0000 mg | ORAL_CAPSULE | Freq: Two times a day (BID) | ORAL | Status: DC

## 2015-07-10 MED ORDER — SERTRALINE HCL 100 MG PO TABS: 100.0000 mg | ORAL_TABLET | Freq: Every day | ORAL | Status: DC

## 2015-07-10 MED ORDER — FUROSEMIDE 40 MG PO TABS: 40.0000 mg | ORAL_TABLET | Freq: Every day | ORAL | Status: DC

## 2015-07-10 MED ORDER — POTASSIUM CHLORIDE CRYS ER 10 MEQ PO TBCR: 10.0000 meq | EXTENDED_RELEASE_TABLET | Freq: Two times a day (BID) | ORAL | Status: DC

## 2015-07-10 MED ORDER — ATORVASTATIN CALCIUM 40 MG PO TABS: 40.0000 mg | ORAL_TABLET | Freq: Every day | ORAL | Status: DC

## 2015-07-10 MED ORDER — FUROSEMIDE 20 MG PO TABS: ORAL_TABLET | ORAL | Status: DC

## 2015-07-10 MED ORDER — TRAZODONE HCL 100 MG PO TABS: 100.0000 mg | ORAL_TABLET | Freq: Every day | ORAL | Status: DC

## 2015-07-10 NOTE — Patient Instructions (Signed)

## 2015-07-10 NOTE — Addendum Note (Signed)
Addended by: Chevis Pretty on: 07/10/2015 10:39 AM   Modules accepted: Orders

## 2015-07-10 NOTE — Progress Notes (Signed)
Subjective:    Patient ID: Kent Bryant, male    DOB: [birth year], 65 y.o.   MRN: [id number]  Patient here today for follow up of chronic medical problems.  Outpatient Encounter Prescriptions as of 07/10/2015  Medication Sig  . atorvastatin (LIPITOR) 40 MG tablet TAKE 1 TABLET (40 MG TOTAL) BY MOUTH DAILY.  . ergocalciferol (VITAMIN D2) 50000 UNITS capsule Take 1 capsule (50,000 Units total) by mouth once a week.  . furosemide (LASIX) 20 MG tablet TAKE 2 TABLETS (40 MG TOTAL) BY MOUTH DAILY.  Marland Kitchen gabapentin (NEURONTIN) 300 MG capsule Take 2 capsules (600 mg total) by mouth 2 (two) times daily.  . potassium chloride (K-DUR,KLOR-CON) 10 MEQ tablet Take 1 tablet (10 mEq total) by mouth 2 (two) times daily.  . prednisoLONE acetate (PRED FORTE) 1 % ophthalmic suspension Place 1 drop into both eyes 4 (four) times daily.  . sertraline (ZOLOFT) 100 MG tablet Take 1 tablet (100 mg total) by mouth daily.  . traZODone (DESYREL) 100 MG tablet Take 1 tablet (100 mg total) by mouth at bedtime.   No facility-administered encounter medications on file as of 07/10/2015.     Hyperlipidemia This is a chronic problem. The current episode started more than 1 year ago. The problem is uncontrolled. Recent lipid tests were reviewed and are variable. Current antihyperlipidemic treatment includes statins. The current treatment provides moderate improvement of lipids. Compliance problems include adherence to diet and adherence to exercise.  Risk factors for coronary artery disease include dyslipidemia.  Depression/GAD zoloft and xanax - combination working well for him- no side effects Peripheral neuropathy Gabapentin- works well but still has burning sensation at times Vitamin d deficiency Vitamin d 50,000 u weekly- no c/o side effects Peripheral edema Right leg worse then left- takes lasix which helps and he also wears compression hose. Denies any pain.  *Review of Systems  HENT: Positive for congestion.   Eyes:  Negative.   Respiratory: Positive for cough.   Cardiovascular: Negative.   Gastrointestinal: Negative.   Genitourinary: Negative.   Musculoskeletal: Negative.   Neurological: Negative.   Psychiatric/Behavioral: Negative.   All other systems reviewed and are negative.      Objective:   Physical Exam  Constitutional: He is oriented to person, place, and time. He appears well-developed and well-nourished.  HENT:  Head: Normocephalic.  Right Ear: External ear normal.  Left Ear: External ear normal.  Nose: Nose normal.  Mouth/Throat: Oropharynx is clear and moist.  Multiple dental caries  Eyes: EOM are normal. Pupils are equal, round, and reactive to light. Left eye discharge: watery.  Neck: Normal range of motion. Neck supple. No JVD present. No thyromegaly present.  Cardiovascular: Normal rate, regular rhythm, normal heart sounds and intact distal pulses.  Exam reveals no gallop and no friction rub.   No murmur heard. Pulmonary/Chest: Effort normal and breath sounds normal. No respiratory distress. He has no wheezes. He has no rales. He exhibits no tenderness.  Abdominal: Soft. Bowel sounds are normal. He exhibits no mass. There is no tenderness.  Umbilical hernia- nontender  Genitourinary:  Refused prostate exam  Musculoskeletal: Normal range of motion. He exhibits edema (3+ pitting edema bil lower ext.).  Lymphadenopathy:    He has no cervical adenopathy.  Neurological: He is alert and oriented to person, place, and time. No cranial nerve deficit.  Skin: Skin is warm and dry.  Psychiatric: He has a normal mood and affect. His behavior is normal. Judgment and thought content normal.  BP 132/76 mmHg  Pulse 74  Temp(Src) 96.9 F (36.1 C) (Oral)  Ht '5\' 11"'  (1.803 m)  Wt 310 lb (140.615 kg)  BMI 43.26 kg/m2       Assessment & Plan:  1. Peripheral neuropathy, idiopathic - gabapentin (NEURONTIN) 300 MG capsule; Take 2 capsules (600 mg total) by mouth 2 (two) times  daily.  Dispense: 120 capsule; Refill: 5  2. Depression Stress management - sertraline (ZOLOFT) 100 MG tablet; Take 1 tablet (100 mg total) by mouth daily.  Dispense: 30 tablet; Refill: 5  3. GAD (generalized anxiety disorder)  4. Hyperlipidemia with target LDL less than 130 Low fat diet - CMP14+EGFR - Lipid panel  5. BMI 40.0-44.9, adult ([place]) Discussed diet and exercise for person with BMI >25 Will recheck weight in 3-6 months  6. Hypokalemia - potassium chloride (K-DUR,KLOR-CON) 10 MEQ tablet; Take 1 tablet (10 mEq total) by mouth 2 (two) times daily.  Dispense: 30 tablet; Refill: 5  7. Peripheral edema Unna boots bil Follow up Friday for recheck Increased lasix to 70m bid Elevate legs when sitting - furosemide (LASIX) 40 MG tablet; Take 1 tablet (40 mg total) by mouth daily.  Dispense: 60 tablet; Refill: 5  8. Insomnia Bedtime ritual - traZODone (DESYREL) 100 MG tablet; Take 1 tablet (100 mg total) by mouth at bedtime.  Dispense: 30 tablet; Refill: 5  9. Screening for malignant neoplasm of the rectum - Fecal occult blood, imunochemical; Future    Labs pending Health maintenance reviewed Diet and exercise encouraged Continue all meds Follow up  In Friday morning July 7,2017   [place] FNP   '

## 2015-07-11 LAB — LIPID PANEL
CHOL/HDL RATIO: 4.3 ratio (ref 0.0–5.0)
Cholesterol, Total: 117 mg/dL (ref 100–199)
HDL: 27 mg/dL — ABNORMAL LOW (ref >39)
LDL CALC: 67 mg/dL (ref 0–99)
Triglycerides: 114 mg/dL (ref 0–149)
VLDL Cholesterol Cal: 23 mg/dL (ref 5–40)

## 2015-07-11 LAB — CMP14+EGFR
ALBUMIN: 3.9 g/dL (ref 3.6–4.8)
ALT: 22 IU/L (ref 0–44)
AST: 24 IU/L (ref 0–40)
Albumin/Globulin Ratio: 1.6 (ref 1.2–2.2)
Alkaline Phosphatase: 115 IU/L (ref 39–117)
BUN / CREAT RATIO: 10 (ref 10–24)
BUN: 11 mg/dL (ref 8–27)
Bilirubin Total: 1 mg/dL (ref 0.0–1.2)
CALCIUM: 9.2 mg/dL (ref 8.6–10.2)
CO2: 24 mmol/L (ref 18–29)
CREATININE: 1.09 mg/dL (ref 0.76–1.27)
Chloride: 99 mmol/L (ref 96–106)
GFR, EST AFRICAN AMERICAN: 83 mL/min/{1.73_m2} (ref >59)
GFR, EST NON AFRICAN AMERICAN: 72 mL/min/{1.73_m2} (ref >59)
GLOBULIN, TOTAL: 2.4 g/dL (ref 1.5–4.5)
Glucose: 94 mg/dL (ref 65–99)
Potassium: 4.2 mmol/L (ref 3.5–5.2)
SODIUM: 143 mmol/L (ref 134–144)
TOTAL PROTEIN: 6.3 g/dL (ref 6.0–8.5)

## 2015-07-14 ENCOUNTER — Encounter: Payer: Self-pay | Admitting: Nurse Practitioner

## 2015-07-14 ENCOUNTER — Ambulatory Visit (INDEPENDENT_AMBULATORY_CARE_PROVIDER_SITE_OTHER): Payer: Medicaid Other | Admitting: Nurse Practitioner

## 2015-07-14 VITALS — BP 138/83 | HR 84 | Temp 97.0°F | Ht 71.0 in | Wt 308.0 lb

## 2015-07-14 DIAGNOSIS — I878 Other specified disorders of veins: Secondary | ICD-10-CM

## 2015-07-14 DIAGNOSIS — R609 Edema, unspecified: Secondary | ICD-10-CM | POA: Diagnosis not present

## 2015-07-14 MED ORDER — V-2 HIGH COMPRESSION HOSE MISC: Status: DC

## 2015-07-14 NOTE — Patient Instructions (Signed)

## 2015-07-14 NOTE — Progress Notes (Signed)
   Subjective:    Patient ID: Kent Bryant, male    DOB: 01-19-1950, 65 y.o.   MRN: [id number]  HPI  Patient was seen on 07/10/15 with venous stasis- unna boots were applied bil and we increased his lasix dose- he is here today for removal of unna boots and recheck legs.    Review of Systems  Constitutional: Negative.   Respiratory: Negative.   Cardiovascular: Negative.   Genitourinary: Negative.   Neurological: Negative.   Psychiatric/Behavioral: Negative.   All other systems reviewed and are negative.      Objective:   Physical Exam  Constitutional: He is oriented to person, place, and time. He appears well-developed and well-nourished.  Cardiovascular: Normal rate, regular rhythm and normal heart sounds.   Pulmonary/Chest: Effort normal and breath sounds normal.  Musculoskeletal: He exhibits edema (!+ edema on left lower leg and 2+ edema right lower leg- (much better then last visit)).  Neurological: He is alert and oriented to person, place, and time.  Skin: Skin is warm.  Psychiatric: He has a normal mood and affect. His behavior is normal. Judgment and thought content normal.    BP 138/83 mmHg  Pulse 84  Temp(Src) 97 F (36.1 C) (Oral)  Ht 5\' 11"  (1.803 m)  Wt 308 lb (139.708 kg)  BMI 42.98 kg/m2  wieght down 2lbs     Assessment & Plan:   1. Peripheral edema   2. Venous stasis    Order given for compression hose to be worn daily COntinue lasix as rx Elevate legs when sitting Follow up in 3 months   mini-mental status exam

## 2015-08-29 ENCOUNTER — Telehealth: Payer: Self-pay | Admitting: Nurse Practitioner

## 2015-09-06 ENCOUNTER — Other Ambulatory Visit: Payer: Self-pay | Admitting: Nurse Practitioner

## 2015-09-06 ENCOUNTER — Other Ambulatory Visit: Payer: Self-pay | Admitting: Family

## 2015-09-06 DIAGNOSIS — E876 Hypokalemia: Secondary | ICD-10-CM

## 2015-09-13 ENCOUNTER — Telehealth: Payer: Self-pay | Admitting: Nurse Practitioner

## 2015-09-13 NOTE — Telephone Encounter (Signed)
appt scheduled

## 2015-09-19 ENCOUNTER — Encounter: Payer: Self-pay | Admitting: Nurse Practitioner

## 2015-09-19 ENCOUNTER — Ambulatory Visit (INDEPENDENT_AMBULATORY_CARE_PROVIDER_SITE_OTHER): Payer: Medicaid Other | Admitting: Nurse Practitioner

## 2015-09-19 ENCOUNTER — Other Ambulatory Visit: Payer: Self-pay | Admitting: Nurse Practitioner

## 2015-09-19 VITALS — BP 134/79 | HR 73 | Temp 97.0°F | Ht 71.0 in | Wt 311.0 lb

## 2015-09-19 DIAGNOSIS — L03119 Cellulitis of unspecified part of limb: Secondary | ICD-10-CM

## 2015-09-19 DIAGNOSIS — E876 Hypokalemia: Secondary | ICD-10-CM

## 2015-09-19 MED ORDER — CIPROFLOXACIN HCL 500 MG PO TABS: 500.0000 mg | ORAL_TABLET | Freq: Two times a day (BID) | ORAL | 0 refills | Status: DC

## 2015-09-19 NOTE — Patient Instructions (Signed)

## 2015-09-19 NOTE — Progress Notes (Signed)
   Subjective:    Patient ID: Kent Bryant, male    DOB: 17-Oct-1950, 65 y.o.   MRN: [id number]  HPI Patient in today for ER follow up. He went to ER in [place] for leg swelling. He was diagnosed with cellulitis and was given bactrim DS. He is much beter. STill has some blister areas on hs legs but are much better. He has been wearing compression hose which has helped with swelling.    Review of Systems  Constitutional: Negative.   HENT: Negative.   Respiratory: Negative.   Cardiovascular: Negative.   Genitourinary: Negative.   Neurological: Negative.   Psychiatric/Behavioral: Negative.   All other systems reviewed and are negative.      Objective:   Physical Exam  Constitutional: He appears well-developed and well-nourished.  Cardiovascular: Normal rate and normal heart sounds.   Pulmonary/Chest: Effort normal and breath sounds normal.  Musculoskeletal: He exhibits edema (1+ edema il lower ext).  Skin: Skin is warm. There is erythema (billower ext - warm to ticuh with scatter vesicular weeping lesions bil ankles.).    BP 134/79   Pulse 73   Temp 97 F (36.1 C) (Oral)   Ht 5\' 11"  (1.803 m)   Wt (!) 311 lb (141.1 kg)   BMI 43.38 kg/m        Assessment & Plan:   1. Cellulitis of lower extremity, unspecified laterality    Meds ordered this encounter  Medications  . sulfamethoxazole-trimethoprim (BACTRIM DS,SEPTRA DS) 800-160 MG tablet    Sig: TAKE 1 TABLET TWICE A DAY FOR TEN DAYS    Refill:  0   Elevate legs when sitting Continue to wear compression hose Follow up in 1 week  [place], FNP

## 2015-09-26 ENCOUNTER — Ambulatory Visit (INDEPENDENT_AMBULATORY_CARE_PROVIDER_SITE_OTHER): Payer: Medicaid Other | Admitting: Nurse Practitioner

## 2015-09-26 ENCOUNTER — Encounter: Payer: Self-pay | Admitting: Nurse Practitioner

## 2015-09-26 VITALS — BP 126/72 | HR 71 | Temp 96.7°F | Ht 71.0 in | Wt 312.0 lb

## 2015-09-26 DIAGNOSIS — L03119 Cellulitis of unspecified part of limb: Secondary | ICD-10-CM | POA: Diagnosis not present

## 2015-09-26 DIAGNOSIS — R609 Edema, unspecified: Secondary | ICD-10-CM | POA: Diagnosis not present

## 2015-09-26 NOTE — Progress Notes (Signed)
   Subjective:    Patient ID: Kent Bryant, male    DOB: Aug 16, 1950, 65 y.o.   MRN: [id number]  HPI  Patient was seen on 09/19/15 with cellulitis of lower ext-- he was put on Cipro- he is here today from follow up. He says that he does nit feel like has been a change in legs.  Review of Systems  Constitutional: Negative.   Respiratory: Negative.   Cardiovascular: Negative.   Genitourinary: Negative.   Neurological: Negative.   Psychiatric/Behavioral: Negative.   All other systems reviewed and are negative.      Objective:   Physical Exam  Constitutional: He is oriented to person, place, and time. He appears well-developed and well-nourished.  Cardiovascular: Normal rate, regular rhythm and normal heart sounds.   Pulmonary/Chest: Effort normal and breath sounds normal.  Musculoskeletal: He exhibits edema (!+ edema on left lower leg and 2+ edema right lower leg- (much better then last visit)).  Neurological: He is alert and oriented to person, place, and time.  Skin: Skin is warm.  Some of lower ext erythema has resolved.  Psychiatric: He has a normal mood and affect. His behavior is normal. Judgment and thought content normal.    BP 126/72   Pulse 71   Temp (!) 96.7 F (35.9 C) (Oral)   Ht 5\' 11"  (1.803 m)   Wt (!) 312 lb (141.5 kg)   BMI 43.52 kg/m       Assessment & Plan:   1. Peripheral edema   2. Cellulitis of lower extremity, unspecified laterality    Finish cipro as ordered Unna boots bil Elevated legs when sitting Take lasix BID until seen on Friday for follow up  [place], FNP

## 2015-09-29 ENCOUNTER — Encounter: Payer: Self-pay | Admitting: Nurse Practitioner

## 2015-09-29 ENCOUNTER — Ambulatory Visit (INDEPENDENT_AMBULATORY_CARE_PROVIDER_SITE_OTHER): Payer: Medicaid Other | Admitting: Nurse Practitioner

## 2015-09-29 ENCOUNTER — Other Ambulatory Visit: Payer: Self-pay | Admitting: Nurse Practitioner

## 2015-09-29 VITALS — BP 132/72 | HR 76 | Temp 97.5°F | Ht 71.0 in | Wt 314.0 lb

## 2015-09-29 DIAGNOSIS — L03119 Cellulitis of unspecified part of limb: Secondary | ICD-10-CM

## 2015-09-29 NOTE — Progress Notes (Signed)
   Subjective:    Patient ID: Kent Bryant, male    DOB: Feb 04, 1950, 65 y.o.   MRN: [id number]  HPI Patient was seen Tuesday with unresolving cellulitis and venous stasis of bl lower ext. We told him to continue antibiotic and we applied unna boots bil. Here today for removal of unna boots and follow up.    Review of Systems  Constitutional: Negative.   HENT: Negative.   Respiratory: Positive for shortness of breath (normal for him).   Cardiovascular: Positive for leg swelling.  Genitourinary: Negative.   Neurological: Negative.   Psychiatric/Behavioral: Negative.   All other systems reviewed and are negative.      Objective:   Physical Exam  Constitutional: He is oriented to person, place, and time. He appears well-developed and well-nourished. No distress.  Cardiovascular: Normal rate and normal heart sounds.   Pulmonary/Chest: Effort normal and breath sounds normal.  Musculoskeletal: He exhibits edema (1+ BIL LOWER EXT).  Neurological: He is alert and oriented to person, place, and time.  Skin:  Circulatory skin changes to bil lower ext.  Psychiatric: He has a normal mood and affect. His behavior is normal. Judgment and thought content normal.    BP 132/72   Pulse 76   Temp 97.5 F (36.4 C) (Oral)   Ht 5\' 11"  (1.803 m)   Wt (!) 314 lb (142.4 kg)   BMI 43.79 kg/m        Assessment & Plan:   1. Cellulitis of lower extremity, unspecified laterality    Elevate legs when sitting Wear compression hose daily Continue lasix as rx Follow up prn  Mary-Margaret Hassell Done, FNP

## 2016-01-28 ENCOUNTER — Other Ambulatory Visit: Payer: Self-pay | Admitting: Nurse Practitioner

## 2016-01-28 DIAGNOSIS — G609 Hereditary and idiopathic neuropathy, unspecified: Secondary | ICD-10-CM

## 2016-01-29 ENCOUNTER — Other Ambulatory Visit: Payer: Self-pay | Admitting: Nurse Practitioner

## 2016-01-29 ENCOUNTER — Telehealth: Payer: Self-pay | Admitting: Nurse Practitioner

## 2016-01-29 MED ORDER — ALBENDAZOLE 200 MG PO TABS: 400.0000 mg | ORAL_TABLET | Freq: Once | ORAL | 0 refills | Status: AC

## 2016-01-29 NOTE — Telephone Encounter (Signed)
Patient aware.

## 2016-01-29 NOTE — Progress Notes (Signed)
rx for pinworms sent to pharmacy

## 2016-01-29 NOTE — Telephone Encounter (Signed)
Patient states that he has pin worms and he wants to know if you will call him something in?

## 2016-02-02 ENCOUNTER — Ambulatory Visit (INDEPENDENT_AMBULATORY_CARE_PROVIDER_SITE_OTHER): Payer: Medicaid Other | Admitting: Nurse Practitioner

## 2016-02-02 ENCOUNTER — Encounter: Payer: Self-pay | Admitting: Nurse Practitioner

## 2016-02-02 VITALS — BP 133/75 | HR 73 | Temp 97.0°F | Ht 71.0 in | Wt 302.0 lb

## 2016-02-02 DIAGNOSIS — S81802A Unspecified open wound, left lower leg, initial encounter: Secondary | ICD-10-CM

## 2016-02-02 DIAGNOSIS — S91002A Unspecified open wound, left ankle, initial encounter: Secondary | ICD-10-CM | POA: Diagnosis not present

## 2016-02-02 DIAGNOSIS — S81002D Unspecified open wound, left knee, subsequent encounter: Secondary | ICD-10-CM

## 2016-02-02 DIAGNOSIS — R609 Edema, unspecified: Secondary | ICD-10-CM

## 2016-02-02 DIAGNOSIS — S91002D Unspecified open wound, left ankle, subsequent encounter: Secondary | ICD-10-CM

## 2016-02-02 DIAGNOSIS — S81002A Unspecified open wound, left knee, initial encounter: Secondary | ICD-10-CM | POA: Diagnosis not present

## 2016-02-02 DIAGNOSIS — S81802D Unspecified open wound, left lower leg, subsequent encounter: Secondary | ICD-10-CM

## 2016-02-02 MED ORDER — METOLAZONE 2.5 MG PO TABS: 2.5000 mg | ORAL_TABLET | Freq: Every day | ORAL | 1 refills | Status: DC

## 2016-02-02 MED ORDER — CIPROFLOXACIN HCL 500 MG PO TABS: 500.0000 mg | ORAL_TABLET | Freq: Two times a day (BID) | ORAL | 0 refills | Status: DC

## 2016-02-02 NOTE — Progress Notes (Signed)
   Subjective:    Patient ID: Kent Bryant, male    DOB: 12/01/1950, 66 y.o.   MRN: [id number]  HPI  Patient here today for hospital follow up- He went to ER in [place] and they started him on keflex for left shin wound. He says that he does not know what happened, wound just appear on leg- he denies hitting it on anything. He says wound looks some better. Has finished antibiotic.   Review of Systems  Constitutional: Negative.   HENT: Negative.   Cardiovascular: Negative.   Genitourinary: Negative.   Skin: Positive for wound (left shin).  Neurological: Negative.   Psychiatric/Behavioral: Negative.   All other systems reviewed and are negative.      Objective:   Physical Exam  Constitutional: He appears well-developed and well-nourished. No distress.  Cardiovascular: Normal rate, regular rhythm and normal heart sounds.   Pulmonary/Chest: Effort normal and breath sounds normal.  Musculoskeletal: He exhibits edema (3+ edema bil lower ext.).  Skin:  2cm by 8cm scabbed over wound with surrounding erythema and edema   BP 133/75   Pulse 73   Temp 97 F (36.1 C) (Oral)   Ht 5\' 11"  (1.803 m)   Wt (!) 302 lb (137 kg)   BMI 42.12 kg/m        Assessment & Plan:  1. Peripheral edema Only take zaroxlyn for 2 days Wear com[resion hose daily Follow up Monday - metolazone (ZAROXOLYN) 2.5 MG tablet; Take 1 tablet (2.5 mg total) by mouth daily.  Dispense: 10 tablet; Refill: 1  2. Open wound of left knee, leg, and ankle with complication, subsequent encounter Keep wound clean dry and covered - ciprofloxacin (CIPRO) 500 MG tablet; Take 1 tablet (500 mg total) by mouth 2 (two) times daily.  Dispense: 20 tablet; Refill: 0   Mary-Margaret Hassell Done, FNP

## 2016-02-05 ENCOUNTER — Ambulatory Visit (INDEPENDENT_AMBULATORY_CARE_PROVIDER_SITE_OTHER): Payer: Medicaid Other | Admitting: Nurse Practitioner

## 2016-02-05 ENCOUNTER — Encounter: Payer: Self-pay | Admitting: Nurse Practitioner

## 2016-02-05 ENCOUNTER — Ambulatory Visit: Payer: Medicaid Other | Admitting: Nurse Practitioner

## 2016-02-05 VITALS — BP 129/71 | HR 75 | Temp 97.0°F | Ht 71.0 in | Wt 295.0 lb

## 2016-02-05 DIAGNOSIS — S81802D Unspecified open wound, left lower leg, subsequent encounter: Secondary | ICD-10-CM | POA: Diagnosis not present

## 2016-02-05 DIAGNOSIS — S91002D Unspecified open wound, left ankle, subsequent encounter: Secondary | ICD-10-CM

## 2016-02-05 DIAGNOSIS — S81002D Unspecified open wound, left knee, subsequent encounter: Secondary | ICD-10-CM

## 2016-02-05 DIAGNOSIS — R609 Edema, unspecified: Secondary | ICD-10-CM | POA: Diagnosis not present

## 2016-02-05 NOTE — Progress Notes (Signed)
   Subjective:    Patient ID: Kent Bryant, male    DOB: 1950-05-13, 66 y.o.   MRN: [id number]  HPI Patient comes in today for recheck of left shin wound- he originally went to [place] and was given keflex. When I saw him on 01/01/17 the area was still red and inflammed. He was given cipro 500mg  BID. He is here today to recheck. He also had 2-3 + edema bil lower ext. He was given zaroxlyn to take just for 2 days and was told to wear compression hose.    Review of Systems  Constitutional: Negative.   HENT: Negative.   Respiratory: Negative.   Cardiovascular: Positive for leg swelling.  Gastrointestinal: Negative.   Musculoskeletal: Negative.   Neurological: Negative.   Psychiatric/Behavioral: Negative.   All other systems reviewed and are negative.      Objective:   Physical Exam  Constitutional: He appears well-developed and well-nourished.  Weight down 7 lbs   Cardiovascular: Normal rate.   Pulmonary/Chest: Effort normal and breath sounds normal.  Musculoskeletal: He exhibits edema (2+ bil lower ext).  Neurological: He is alert.  Skin: Skin is warm.  Wound to left lower shin is scabbed over- still has cool surrounding erythema  Psychiatric: He has a normal mood and affect. His behavior is normal. Judgment and thought content normal.   BP 129/71   Pulse 75   Temp 97 F (36.1 C) (Oral)   Ht 5\' 11"  (1.803 m)   Wt 295 lb (133.8 kg)   BMI 41.14 kg/m        Assessment & Plan:  1. Peripheral edema zaroxlyn tomorrow Continue compression socks  2. Open wound of left knee, leg, and ankle with complication, subsequent encounter Finish antibiotic keep covered RTO for follow up on Thursday  Mary-Margaret Hassell Done, FNP

## 2016-02-05 NOTE — Patient Instructions (Signed)
Venous Ulcer Introduction A venous ulcer is a shallow sore on your lower leg. It is caused by poor circulation in your veins. Venous ulcer is the most common type of lower leg ulcer. You may have venous ulcers on one leg or on both legs. This condition most often develops around your ankles. This type of ulcer may last for a long time (chronic ulcer) or it may return often (recurrent ulcer). Follow these instructions at home: Wound care  Follow instructions from your doctor about:  How to take care of your wound.  When and how you should change your bandage (dressing).  When you should remove your bandage. If your bandage is dry and gets stuck to your leg when you try to remove it, moisten or wet the bandage with saline solution or water. This helps you to remove it without harming your skin or wound.  Check your wound every day for signs of infection. Have a caregiver do this for you if you are not able to do it yourself. Watch for:  More redness, swelling, or pain.  More fluid or blood.  Pus, warmth, or a bad smell. Medicines  Take over-the-counter and prescription medicines only as told by your doctor.  If you were prescribed an antibiotic medicine, take it or apply it as told by your doctor. Do not stop taking or using the antibiotic even if your condition improves. Activity  Do not stand or sit in one position for a long period of time. Rest with your legs raised during the day. If possible, keep your legs above your heart for 30 minutes, 3-4 times a day, or as told by your doctor.  Do not sit with your legs crossed.  Walk often to increase the blood flow in your legs. Ask your doctor what level of activity is safe for you.  If you are taking a long ride in a car or plane, take a break to walk around at least once every two hours, or as told by your doctor. Ask your doctor if you should take aspirin before long trips. General instructions  Wear elastic stockings, compression  stockings, or support hose as told by your doctor. This is very important.  Raise the foot of your bed as told by your doctor.  Do not smoke.  Keep all follow-up visits as told by your doctor. This is important. Contact a doctor if:  You have a fever.  Your ulcer is getting larger or is not healing.  Your pain gets worse.  You have more redness or swelling around your ulcer.  You have more fluid, blood, or pus coming from your ulcer after it has been cleaned by you or your doctor.  You have warmth or a bad smell coming from your ulcer. This information is not intended to replace advice given to you by your health care provider. Make sure you discuss any questions you have with your health care provider. Document Released: 02/01/2004 Document Revised: 06/01/2015 Document Reviewed: 05/04/2014  2017 [employer]vier

## 2016-02-08 ENCOUNTER — Encounter: Payer: Self-pay | Admitting: Nurse Practitioner

## 2016-02-08 ENCOUNTER — Ambulatory Visit (INDEPENDENT_AMBULATORY_CARE_PROVIDER_SITE_OTHER): Payer: Medicaid Other | Admitting: Nurse Practitioner

## 2016-02-08 VITALS — BP 135/70 | HR 58 | Temp 97.2°F | Ht 71.0 in | Wt 288.0 lb

## 2016-02-08 DIAGNOSIS — R609 Edema, unspecified: Secondary | ICD-10-CM

## 2016-02-08 DIAGNOSIS — L97921 Non-pressure chronic ulcer of unspecified part of left lower leg limited to breakdown of skin: Secondary | ICD-10-CM

## 2016-02-08 DIAGNOSIS — Z23 Encounter for immunization: Secondary | ICD-10-CM | POA: Diagnosis not present

## 2016-02-08 NOTE — Patient Instructions (Signed)
Venous Ulcer Introduction A venous ulcer is a shallow sore on your lower leg. It is caused by poor circulation in your veins. Venous ulcer is the most common type of lower leg ulcer. You may have venous ulcers on one leg or on both legs. This condition most often develops around your ankles. This type of ulcer may last for a long time (chronic ulcer) or it may return often (recurrent ulcer). Follow these instructions at home: Wound care  Follow instructions from your doctor about:  How to take care of your wound.  When and how you should change your bandage (dressing).  When you should remove your bandage. If your bandage is dry and gets stuck to your leg when you try to remove it, moisten or wet the bandage with saline solution or water. This helps you to remove it without harming your skin or wound.  Check your wound every day for signs of infection. Have a caregiver do this for you if you are not able to do it yourself. Watch for:  More redness, swelling, or pain.  More fluid or blood.  Pus, warmth, or a bad smell. Medicines  Take over-the-counter and prescription medicines only as told by your doctor.  If you were prescribed an antibiotic medicine, take it or apply it as told by your doctor. Do not stop taking or using the antibiotic even if your condition improves. Activity  Do not stand or sit in one position for a long period of time. Rest with your legs raised during the day. If possible, keep your legs above your heart for 30 minutes, 3-4 times a day, or as told by your doctor.  Do not sit with your legs crossed.  Walk often to increase the blood flow in your legs. Ask your doctor what level of activity is safe for you.  If you are taking a long ride in a car or plane, take a break to walk around at least once every two hours, or as told by your doctor. Ask your doctor if you should take aspirin before long trips. General instructions  Wear elastic stockings, compression  stockings, or support hose as told by your doctor. This is very important.  Raise the foot of your bed as told by your doctor.  Do not smoke.  Keep all follow-up visits as told by your doctor. This is important. Contact a doctor if:  You have a fever.  Your ulcer is getting larger or is not healing.  Your pain gets worse.  You have more redness or swelling around your ulcer.  You have more fluid, blood, or pus coming from your ulcer after it has been cleaned by you or your doctor.  You have warmth or a bad smell coming from your ulcer. This information is not intended to replace advice given to you by your health care provider. Make sure you discuss any questions you have with your health care provider. Document Released: 02/01/2004 Document Revised: 06/01/2015 Document Reviewed: 05/04/2014  2017 [employer]vier

## 2016-02-08 NOTE — Progress Notes (Signed)
   Subjective:    Patient ID: Kent Bryant, male    DOB: 01-16-1950, 66 y.o.   MRN: [id number]  HPI Patient comes in today for recheck of left lower leg. He has had a sore lesion on shin with lots of lower ext edema. He has been on antibiotic and has been taking zaroxlyn along with his lasix.    Review of Systems  Constitutional: Negative.   HENT: Negative.   Respiratory: Negative.   Cardiovascular: Negative.   Genitourinary: Negative.   Neurological: Negative.   Psychiatric/Behavioral: Negative.   All other systems reviewed and are negative.      Objective:   Physical Exam  Constitutional: He is oriented to person, place, and time. He appears well-developed and well-nourished. No distress.  Weight down 7 more pounds  Cardiovascular: Normal rate and regular rhythm.   Pulmonary/Chest: Effort normal and breath sounds normal.  Musculoskeletal: He exhibits edema (1-2+ edema bil lower ext).  Neurological: He is alert and oriented to person, place, and time.  Skin: Skin is warm.  3X6 elongated scabbed ove rlesion with surroundig erythem left shin- looks much better then did earlier in week.  Psychiatric: He has a normal mood and affect. His behavior is normal. Judgment and thought content normal.   BP 135/70   Pulse (!) 58   Temp 97.2 F (36.2 C) (Oral)   Ht 5\' 11"  (1.803 m)   Wt 288 lb (130.6 kg)   BMI 40.17 kg/m       Assessment & Plan:   1. Peripheral edema   2. Skin ulcer of left lower leg, limited to breakdown of skin ([facility])    Continue daily wound care Wear compression hose daily elevate legs when sitting No more zaroxlyn for now Follow up if swelling sytarts to increase Daily weights- if continues to rise daily NTBS  Mary-Margaret Hassell Done, FNP

## 2016-02-17 ENCOUNTER — Other Ambulatory Visit: Payer: Self-pay | Admitting: Nurse Practitioner

## 2016-02-17 DIAGNOSIS — S81002D Unspecified open wound, left knee, subsequent encounter: Secondary | ICD-10-CM

## 2016-02-17 DIAGNOSIS — S81802D Unspecified open wound, left lower leg, subsequent encounter: Principal | ICD-10-CM

## 2016-02-17 DIAGNOSIS — S91002D Unspecified open wound, left ankle, subsequent encounter: Principal | ICD-10-CM

## 2016-03-01 ENCOUNTER — Other Ambulatory Visit: Payer: Self-pay | Admitting: Nurse Practitioner

## 2016-03-01 DIAGNOSIS — G609 Hereditary and idiopathic neuropathy, unspecified: Secondary | ICD-10-CM

## 2016-03-13 ENCOUNTER — Ambulatory Visit: Payer: Medicaid Other | Admitting: Family Medicine

## 2016-03-18 ENCOUNTER — Ambulatory Visit: Payer: Medicaid Other | Admitting: Family Medicine

## 2016-03-20 ENCOUNTER — Ambulatory Visit (INDEPENDENT_AMBULATORY_CARE_PROVIDER_SITE_OTHER): Payer: Medicaid Other | Admitting: Family Medicine

## 2016-03-20 ENCOUNTER — Ambulatory Visit: Payer: Medicaid Other | Admitting: Physician Assistant

## 2016-03-20 ENCOUNTER — Telehealth: Payer: Self-pay | Admitting: Family Medicine

## 2016-03-20 ENCOUNTER — Encounter: Payer: Self-pay | Admitting: Family Medicine

## 2016-03-20 VITALS — BP 139/77 | HR 79 | Temp 98.0°F | Ht 71.0 in | Wt 281.0 lb

## 2016-03-20 DIAGNOSIS — L03119 Cellulitis of unspecified part of limb: Secondary | ICD-10-CM | POA: Diagnosis not present

## 2016-03-20 DIAGNOSIS — L2089 Other atopic dermatitis: Secondary | ICD-10-CM | POA: Diagnosis not present

## 2016-03-20 DIAGNOSIS — E876 Hypokalemia: Secondary | ICD-10-CM

## 2016-03-20 MED ORDER — METHYLPREDNISOLONE ACETATE 80 MG/ML IJ SUSP
80.0000 mg | Freq: Once | INTRAMUSCULAR | Status: AC
  Administered 2016-03-20: 80 mg via INTRAMUSCULAR

## 2016-03-20 MED ORDER — CEFTRIAXONE SODIUM 1 G IJ SOLR
1.0000 g | Freq: Once | INTRAMUSCULAR | Status: AC
  Administered 2016-03-20: 1 g via INTRAMUSCULAR

## 2016-03-20 NOTE — Telephone Encounter (Signed)
I just said go back to PCP, we had her listed as his PCP and that's why I back but he can go ahead and see me and I have no problem with that.

## 2016-03-20 NOTE — Progress Notes (Signed)
BP 139/77   Pulse 79   Temp 98 F (36.7 C) (Oral)   Ht '5\' 11"'  (1.803 m)   Wt 281 lb (127.5 kg)   BMI 39.19 kg/m    Subjective:    Patient ID: Kent Bryant, male    DOB: 12/17/1950, 66 y.o.   MRN: [id number]  HPI: Kent Bryant is a 66 y.o. male presenting on 03/20/2016 for Hospitalization Follow-up and Itchy rash all over body   HPI Hospital follow-up for pneumonia and cellulitis Patient is coming in today for hospital follow-up for pneumonia and cellulitis of bilateral lower extremities and abdomen. He has been fighting swelling in his legs and issues with this for quite some time but it is recently worsened. He is also had more stretching and itching and burning sensation in both of his lower legs and his abdomen. He was sent home from the hospital on 03/07/2016 and given clindamycin which he picked up on 03/09/2016 and has taken 19 out of the 63 pills prescribed to him. It was prescribed as 3 times daily and it appears that he is only taken about 6 days worth in the past 13 days. He denies any fevers or chills. His shortness of breath is greatly improved almost back to baseline. He denies any sick contacts that he knows of. He usually wears compression stockings but does not have them on today so he could show me his feet.  Relevant past medical, surgical, family and social history reviewed and updated as indicated. Interim medical history since our last visit reviewed. Allergies and medications reviewed and updated.  Review of Systems  Constitutional: Negative for chills and fever.  Respiratory: Positive for cough (Much improved) and shortness of breath. Negative for wheezing.   Cardiovascular: Positive for leg swelling. Negative for chest pain.  Musculoskeletal: Negative for back pain and gait problem.  Skin: Positive for color change and rash.  All other systems reviewed and are negative.   Per HPI unless specifically indicated above      Objective:    BP 139/77   Pulse 79    Temp 98 F (36.7 C) (Oral)   Ht '5\' 11"'  (1.803 m)   Wt 281 lb (127.5 kg)   BMI 39.19 kg/m   Wt Readings from Last 3 Encounters:  03/20/16 281 lb (127.5 kg)  02/08/16 288 lb (130.6 kg)  02/05/16 295 lb (133.8 kg)    Physical Exam  Constitutional: He is oriented to person, place, and time. He appears well-developed and well-nourished. No distress.  Eyes: Conjunctivae are normal. No scleral icterus.  Cardiovascular: Normal rate, regular rhythm, normal heart sounds and intact distal pulses.   No murmur heard. Pulmonary/Chest: Effort normal and breath sounds normal. No respiratory distress. He has no wheezes. He has no rales.  Musculoskeletal: Normal range of motion. He exhibits no edema.  Neurological: He is alert and oriented to person, place, and time. Coordination normal.  Skin: Skin is warm and dry. Rash noted. He is not diaphoretic. There is erythema (Erythema and scaly and swollen bilateral lower legs and lower abdomen. Warm to palpation, no purulence or drainage.).  Psychiatric: He has a normal mood and affect. His behavior is normal.  Nursing note and vitals reviewed.     Assessment & Plan:   Problem List Items Addressed This Visit      Other   Hypokalemia   Relevant Orders   CMP14+EGFR    Other Visit Diagnoses    Cellulitis of lower extremity, unspecified laterality    -  Primary   The swelling is causes skin to stretch and be very pruritic and it seems like he spreading the infection, we'll give a shot of Rocephin, and Depo-Medrol   Relevant Medications   cefTRIAXone (ROCEPHIN) injection 1 g (Start on 03/20/2016  1:00 PM)   Other Relevant Orders   CBC with Differential/Platelet   Other atopic dermatitis       Relevant Medications   methylPREDNISolone acetate (DEPO-MEDROL) injection 80 mg (Start on 03/20/2016  1:00 PM)       Follow up plan: Return if symptoms worsen or fail to improve.  Counseling provided for all of the vaccine components No orders of the  defined types were placed in this encounter.   Caryl Pina, MD [facility] 03/20/2016, 12:41 PM

## 2016-03-21 NOTE — Telephone Encounter (Signed)
Pt's sister aware of MD feedback and voiced understanding and appt scheduled with Dr. Warrick Parisian on 3/21 for his 1 week follow up.

## 2016-03-23 ENCOUNTER — Other Ambulatory Visit: Payer: Self-pay | Admitting: Nurse Practitioner

## 2016-03-23 DIAGNOSIS — R609 Edema, unspecified: Secondary | ICD-10-CM

## 2016-03-25 NOTE — Progress Notes (Signed)
Patient will pick up new stool card at appt on 03/21

## 2016-03-26 ENCOUNTER — Ambulatory Visit: Payer: Medicaid Other | Admitting: Nurse Practitioner

## 2016-03-27 ENCOUNTER — Ambulatory Visit (INDEPENDENT_AMBULATORY_CARE_PROVIDER_SITE_OTHER): Payer: Medicaid Other | Admitting: Family Medicine

## 2016-03-27 ENCOUNTER — Encounter: Payer: Self-pay | Admitting: Family Medicine

## 2016-03-27 VITALS — BP 131/68 | HR 81 | Temp 97.4°F | Ht 71.0 in | Wt 281.0 lb

## 2016-03-27 DIAGNOSIS — L03119 Cellulitis of unspecified part of limb: Secondary | ICD-10-CM | POA: Diagnosis not present

## 2016-03-27 DIAGNOSIS — B369 Superficial mycosis, unspecified: Secondary | ICD-10-CM

## 2016-03-27 DIAGNOSIS — L2089 Other atopic dermatitis: Secondary | ICD-10-CM

## 2016-03-27 MED ORDER — FLUCONAZOLE 150 MG PO TABS: 150.0000 mg | ORAL_TABLET | ORAL | 0 refills | Status: DC

## 2016-03-27 MED ORDER — PREDNISONE 20 MG PO TABS: ORAL_TABLET | ORAL | 0 refills | Status: DC

## 2016-03-27 NOTE — Progress Notes (Signed)
BP 131/68   Pulse 81   Temp 97.4 F (36.3 C) (Oral)   Ht 5\' 11"  (1.803 m)   Wt 281 lb (127.5 kg)   BMI 39.19 kg/m    Subjective:    Patient ID: Kent Bryant, male    DOB: Jan 08, 1951, 66 y.o.   MRN: [id number]  HPI: Christohper Dube is a 66 y.o. male presenting on 03/27/2016 for Cellulitis (followup) and Eczema   HPI follow-up for cellulitis And rash In today for follow-up on cellulitis and rash. He says the redness and warmth that was on his legs is much better and the pain and swelling that was there is much improved. He still has some swelling there but the pain is better and he still has dry scaly skin but is much improved. He is mostly concerned still about the rash on his abdomen which is very pruritic and scattered over his chest and abdomen. He is having a lot of issues with that and wants to figure out what he can do because it is so pruritic and he just cannot stop itching it.  Relevant past medical, surgical, family and social history reviewed and updated as indicated. Interim medical history since our last visit reviewed. Allergies and medications reviewed and updated.  Review of Systems  Constitutional: Negative for chills and fever.  Respiratory: Negative for cough, shortness of breath and wheezing.   Cardiovascular: Negative for chest pain and leg swelling.  Musculoskeletal: Negative for back pain and gait problem.  Skin: Positive for color change and rash.  All other systems reviewed and are negative.   Per HPI unless specifically indicated above      Objective:    BP 131/68   Pulse 81   Temp 97.4 F (36.3 C) (Oral)   Ht 5\' 11"  (1.803 m)   Wt 281 lb (127.5 kg)   BMI 39.19 kg/m   Wt Readings from Last 3 Encounters:  03/27/16 281 lb (127.5 kg)  03/20/16 281 lb (127.5 kg)  02/08/16 288 lb (130.6 kg)    Physical Exam  Constitutional: He is oriented to person, place, and time. He appears well-developed and well-nourished. No distress.  Eyes: Conjunctivae are  normal. No scleral icterus.  Cardiovascular: Normal rate, regular rhythm, normal heart sounds and intact distal pulses.   No murmur heard. Pulmonary/Chest: Effort normal and breath sounds normal. No respiratory distress. He has no wheezes. He has no rales.  Musculoskeletal: Normal range of motion. He exhibits no edema.  Neurological: He is alert and oriented to person, place, and time. Coordination normal.  Skin: Skin is warm and dry. Rash noted. He is not diaphoretic. There is erythema (scaly and swollen bilateral lower legs and lower abdomen, erythema has much improved. No longer Warm to palpation, no purulence or drainage.).  Psychiatric: He has a normal mood and affect. His behavior is normal.  Nursing note and vitals reviewed.     Assessment & Plan:   Problem List Items Addressed This Visit    None    Visit Diagnoses    Cellulitis of lower extremity, unspecified laterality    -  Primary   Cellulitis looks to be resolved, patient still has a dermatitis on anterior   Fungal dermatitis       Relevant Medications   fluconazole (DIFLUCAN) 150 MG tablet   Other Relevant Orders   Ambulatory referral to Dermatology   Other atopic dermatitis       Relevant Medications   predniSONE (DELTASONE) 20 MG tablet  Other Relevant Orders   Ambulatory referral to Dermatology       Follow up plan: Return if symptoms worsen or fail to improve.  Counseling provided for all of the vaccine components Orders Placed This Encounter  Procedures  . Ambulatory referral to Dermatology    Caryl Pina, MD [facility] 03/27/2016, 4:09 PM

## 2016-04-01 ENCOUNTER — Other Ambulatory Visit: Payer: Self-pay | Admitting: Family Medicine

## 2016-04-23 ENCOUNTER — Other Ambulatory Visit: Payer: Self-pay | Admitting: Family Medicine

## 2016-04-23 ENCOUNTER — Other Ambulatory Visit: Payer: Self-pay | Admitting: Nurse Practitioner

## 2016-04-23 DIAGNOSIS — R609 Edema, unspecified: Secondary | ICD-10-CM

## 2016-04-23 DIAGNOSIS — G609 Hereditary and idiopathic neuropathy, unspecified: Secondary | ICD-10-CM

## 2016-04-23 DIAGNOSIS — E876 Hypokalemia: Secondary | ICD-10-CM

## 2016-04-23 MED ORDER — METOLAZONE 2.5 MG PO TABS: 2.5000 mg | ORAL_TABLET | Freq: Every day | ORAL | 5 refills | Status: DC

## 2016-04-23 MED ORDER — GABAPENTIN 300 MG PO CAPS: 600.0000 mg | ORAL_CAPSULE | Freq: Two times a day (BID) | ORAL | 1 refills | Status: DC

## 2016-04-23 NOTE — Telephone Encounter (Signed)
What is the name of the medication? Gabapentin, metolazone (ZAROXOLYN) 2.5 MG, KLOR-CON 10 10 MEQ tablet  Have you contacted your pharmacy to request a refill? yes  Which pharmacy would you like this sent to? [facility] in [place].   Patient notified that their request is being sent to the clinical staff for review and that they should receive a call once it is complete. If they do not receive a call within 24 hours they can check with their pharmacy or our office.

## 2016-05-01 ENCOUNTER — Telehealth: Payer: Self-pay | Admitting: Family Medicine

## 2016-05-01 NOTE — Telephone Encounter (Signed)
Spoke with caregiver and explained to her what Dr. Warrick Parisian said.  She said he did not want to rely on transportation from [facility] because he did not want to sit at appointments waiting on them to come pick him up.

## 2016-05-01 NOTE — Telephone Encounter (Signed)
Pt referred to dermatologist in [place] Has Medicaid. No provider closer that takes Medicaid. Pt has no driver's license and relative that takes him to appts is 65 yo and does not drive in [place] Can we continue to treat him

## 2016-05-01 NOTE — Telephone Encounter (Signed)
He can come here and we can continue to try and treat it but the reason that I was sending him is because I don't exactly know what this is an don't know if it is going to get better with my treatments. He also has the option that Medicaid will sometimes pay for transport and we should give him the information for that.

## 2016-05-02 ENCOUNTER — Other Ambulatory Visit: Payer: Self-pay | Admitting: Family Medicine

## 2016-05-02 NOTE — Telephone Encounter (Signed)
Last lipid 07/10/15

## 2016-05-09 ENCOUNTER — Ambulatory Visit (INDEPENDENT_AMBULATORY_CARE_PROVIDER_SITE_OTHER): Payer: Medicaid Other | Admitting: Family Medicine

## 2016-05-09 ENCOUNTER — Encounter: Payer: Self-pay | Admitting: Family Medicine

## 2016-05-09 VITALS — BP 131/69 | HR 81 | Temp 97.4°F | Ht 71.0 in | Wt 267.0 lb

## 2016-05-09 DIAGNOSIS — E876 Hypokalemia: Secondary | ICD-10-CM | POA: Diagnosis not present

## 2016-05-09 DIAGNOSIS — L2089 Other atopic dermatitis: Secondary | ICD-10-CM | POA: Diagnosis not present

## 2016-05-09 MED ORDER — PREDNISONE 20 MG PO TABS: ORAL_TABLET | ORAL | 0 refills | Status: DC

## 2016-05-09 NOTE — Progress Notes (Signed)
BP 131/69   Pulse 81   Temp 97.4 F (36.3 C) (Oral)   Ht _0  (1.803 m)   Wt 267 lb (121.1 kg)   BMI 37.24 kg/m    Subjective:    Patient ID: Kent Bryant, male    DOB: 03/01/1950, 66 y.o.   MRN: [id number]  HPI: Kent Bryant is a 66 y.o. male presenting on 05/09/2016 for Dermatitis ([place], patient said he cannot go to [facility] for dermatology appointment, could go to [place] or [place])   HPI Rash Patient has had continued rash on his abdomen and arms that is very pruritic. He has tried some Benadryl and the last time he was here we did prednisone and Diflucan. He said they helped a little but did not help significantly. He denies any fevers or chills or drainage. The rashes are pink and covers this abdomen and scattered on his arms as well. He does not really have any on his back or his legs. He denies any fevers or chills or warmth. We attempted to send him to dermatology but he could not make the transportation to get there.  Relevant past medical, surgical, family and social history reviewed and updated as indicated. Interim medical history since our last visit reviewed. Allergies and medications reviewed and updated.  Review of Systems  Constitutional: Negative for chills and fever.  Respiratory: Negative for shortness of breath and wheezing.   Cardiovascular: Negative for chest pain and leg swelling.  Musculoskeletal: Negative for back pain and gait problem.  Skin: Positive for rash.  All other systems reviewed and are negative.   Per HPI unless specifically indicated above        Objective:    BP 131/69   Pulse 81   Temp 97.4 F (36.3 C) (Oral)   Ht _1  (1.803 m)   Wt 267 lb (121.1 kg)   BMI 37.24 kg/m   Wt Readings from Last 3 Encounters:  05/09/16 267 lb (121.1 kg)  03/27/16 281 lb (127.5 kg)  03/20/16 281 lb (127.5 kg)    Physical Exam  Constitutional: He is oriented to person, place, and time. He appears well-developed and well-nourished. No  distress.  Eyes: Conjunctivae are normal. No scleral icterus.  Musculoskeletal: Normal range of motion. He exhibits no edema.  Neurological: He is alert and oriented to person, place, and time. Coordination normal.  Skin: Skin is warm and dry. Rash (Scaly maculopapular rash covering the abdomen and scattered on arms. He does not have any on his back or his legs. Coordination is also present.) noted. He is not diaphoretic.  Psychiatric: He has a normal mood and affect. His behavior is normal.  Nursing note and vitals reviewed.   Punch biopsy: Area was prepped with Betadine. 1.5 mL of 2% without epinephrine were used for anesthesia. 23 mm areas for punch biopsy were taken from the same lesion on his left upper outer arm. One included the border and 1 included more in the central of the lesion. Patient tolerated procedure well. Silver was used for hemostasis. Bleeding was minimal    Assessment & Plan:   Problem List Items Addressed This Visit    None    Visit Diagnoses    Other atopic dermatitis    -  Primary   Relevant Medications   predniSONE (DELTASONE) 20 MG tablet   Other Relevant Orders   CMP14+EGFR   CBC with Differential/Platelet   Pathology       Follow up plan: Return  if symptoms worsen or fail to improve.  Counseling provided for all of the vaccine components Orders Placed This Encounter  Procedures  . CMP14+EGFR  . CBC with Differential/Platelet    Caryl Pina, MD [place] 05/09/2016, 10:16 AM

## 2016-05-10 LAB — CMP14+EGFR
A/G RATIO: 1.2 (ref 1.2–2.2)
ALBUMIN: 3.7 g/dL (ref 3.6–4.8)
ALT: 24 IU/L (ref 0–44)
AST: 39 IU/L (ref 0–40)
Alkaline Phosphatase: 140 IU/L — ABNORMAL HIGH (ref 39–117)
BUN / CREAT RATIO: 11 (ref 10–24)
BUN: 10 mg/dL (ref 8–27)
Bilirubin Total: 1.4 mg/dL — ABNORMAL HIGH (ref 0.0–1.2)
CO2: 33 mmol/L — ABNORMAL HIGH (ref 18–29)
Calcium: 8.9 mg/dL (ref 8.6–10.2)
Chloride: 91 mmol/L — ABNORMAL LOW (ref 96–106)
Creatinine, Ser: 0.92 mg/dL (ref 0.76–1.27)
GFR, EST AFRICAN AMERICAN: 101 mL/min/{1.73_m2} (ref >59)
GFR, EST NON AFRICAN AMERICAN: 88 mL/min/{1.73_m2} (ref >59)
GLOBULIN, TOTAL: 3 g/dL (ref 1.5–4.5)
Glucose: 143 mg/dL — ABNORMAL HIGH (ref 65–99)
POTASSIUM: 2.7 mmol/L — AB (ref 3.5–5.2)
SODIUM: 139 mmol/L (ref 134–144)
TOTAL PROTEIN: 6.7 g/dL (ref 6.0–8.5)

## 2016-05-10 LAB — CBC WITH DIFFERENTIAL/PLATELET
BASOS ABS: 0 10*3/uL (ref 0.0–0.2)
Basos: 1 %
EOS (ABSOLUTE): 1 10*3/uL — ABNORMAL HIGH (ref 0.0–0.4)
EOS: 13 %
HEMATOCRIT: 45.4 % (ref 37.5–51.0)
HEMOGLOBIN: 15.6 g/dL (ref 13.0–17.7)
IMMATURE GRANS (ABS): 0 10*3/uL (ref 0.0–0.1)
Immature Granulocytes: 0 %
LYMPHS: 20 %
Lymphocytes Absolute: 1.6 10*3/uL (ref 0.7–3.1)
MCH: 31.5 pg (ref 26.6–33.0)
MCHC: 34.4 g/dL (ref 31.5–35.7)
MCV: 92 fL (ref 79–97)
MONOCYTES: 11 %
Monocytes Absolute: 0.9 10*3/uL (ref 0.1–0.9)
NEUTROS PCT: 55 %
Neutrophils Absolute: 4.6 10*3/uL (ref 1.4–7.0)
Platelets: 181 10*3/uL (ref 150–379)
RBC: 4.95 x10E6/uL (ref 4.14–5.80)
RDW: 16.3 % — ABNORMAL HIGH (ref 12.3–15.4)
WBC: 8.2 10*3/uL (ref 3.4–10.8)

## 2016-05-13 NOTE — Addendum Note (Signed)
Addended by: Shelbie Ammons on: 05/13/2016 02:33 PM   Modules accepted: Orders

## 2016-05-13 NOTE — Addendum Note (Signed)
Addended by: Shelbie Ammons on: 05/13/2016 02:30 PM   Modules accepted: Orders

## 2016-05-14 ENCOUNTER — Other Ambulatory Visit: Payer: Medicaid Other

## 2016-05-14 DIAGNOSIS — E876 Hypokalemia: Secondary | ICD-10-CM

## 2016-05-14 LAB — PATHOLOGY

## 2016-05-15 LAB — CMP14+EGFR
A/G RATIO: 1.1 — AB (ref 1.2–2.2)
ALBUMIN: 3.6 g/dL (ref 3.6–4.8)
ALK PHOS: 132 IU/L — AB (ref 39–117)
ALT: 23 IU/L (ref 0–44)
AST: 34 IU/L (ref 0–40)
BILIRUBIN TOTAL: 1.4 mg/dL — AB (ref 0.0–1.2)
BUN / CREAT RATIO: 12 (ref 10–24)
BUN: 11 mg/dL (ref 8–27)
CO2: 38 mmol/L — ABNORMAL HIGH (ref 18–29)
Calcium: 8.8 mg/dL (ref 8.6–10.2)
Chloride: 89 mmol/L — ABNORMAL LOW (ref 96–106)
Creatinine, Ser: 0.92 mg/dL (ref 0.76–1.27)
GFR calc non Af Amer: 88 mL/min/{1.73_m2} (ref >59)
GFR, EST AFRICAN AMERICAN: 101 mL/min/{1.73_m2} (ref >59)
GLOBULIN, TOTAL: 3.2 g/dL (ref 1.5–4.5)
GLUCOSE: 98 mg/dL (ref 65–99)
POTASSIUM: 2.9 mmol/L — AB (ref 3.5–5.2)
SODIUM: 140 mmol/L (ref 134–144)
TOTAL PROTEIN: 6.8 g/dL (ref 6.0–8.5)

## 2016-05-22 ENCOUNTER — Telehealth: Payer: Self-pay | Admitting: Family Medicine

## 2016-05-22 NOTE — Telephone Encounter (Signed)
Phoned

## 2016-05-27 ENCOUNTER — Encounter: Payer: Self-pay | Admitting: Family Medicine

## 2016-05-27 ENCOUNTER — Ambulatory Visit (INDEPENDENT_AMBULATORY_CARE_PROVIDER_SITE_OTHER): Payer: Medicaid Other

## 2016-05-27 ENCOUNTER — Ambulatory Visit (INDEPENDENT_AMBULATORY_CARE_PROVIDER_SITE_OTHER): Payer: Medicaid Other | Admitting: Family Medicine

## 2016-05-27 VITALS — BP 132/73 | HR 72 | Temp 99.1°F | Ht 71.0 in | Wt 261.0 lb

## 2016-05-27 DIAGNOSIS — D487 Neoplasm of uncertain behavior of other specified sites: Secondary | ICD-10-CM

## 2016-05-27 DIAGNOSIS — R590 Localized enlarged lymph nodes: Secondary | ICD-10-CM

## 2016-05-27 NOTE — Progress Notes (Signed)
BP 132/73   Pulse 72   Temp 99.1 F (37.3 C) (Oral)   Ht 5\' 11"  (1.803 m)   Wt 261 lb (118.4 kg)   BMI 36.40 kg/m    Subjective:    Patient ID: Kent Bryant, male    DOB: February 20, 1950, 66 y.o.   MRN: [id number]  HPI: Kent Bryant is a 66 y.o. male presenting on 05/27/2016 for Knot on neck (first noticed 2-3 weeks ago)   HPI Lump on neck/front of chest Patient is coming in because he has a concerning lump on the anterior aspect of his chest that is popped up over the past 3 weeks. He says it has really grown quickly. He says is nontender and has no overlying redness or warmth. He denies any fevers or chills or cough or congestion or runny nose or any other kind of illness currently. He does admit to a chronic history of smoking but denies any shortness of breath or wheezing currently. He denies any hemoptysis or hematemesis. He is just more concerned because of how large the lump has gotten so quickly.  Relevant past medical, surgical, family and social history reviewed and updated as indicated. Interim medical history since our last visit reviewed. Allergies and medications reviewed and updated.  Review of Systems  Constitutional: Negative for chills and fever.  HENT: Negative for congestion, sinus pain and sinus pressure.   Respiratory: Negative for cough, shortness of breath and wheezing.   Cardiovascular: Negative for chest pain and leg swelling.  Musculoskeletal: Negative for back pain and gait problem.  Skin: Negative for color change and rash.  All other systems reviewed and are negative.   Per HPI unless specifically indicated above        Objective:    BP 132/73   Pulse 72   Temp 99.1 F (37.3 C) (Oral)   Ht 5\' 11"  (1.803 m)   Wt 261 lb (118.4 kg)   BMI 36.40 kg/m   Wt Readings from Last 3 Encounters:  05/27/16 261 lb (118.4 kg)  05/09/16 267 lb (121.1 kg)  03/27/16 281 lb (127.5 kg)    Physical Exam  Constitutional: He is oriented to person, place, and  time. He appears well-developed and well-nourished. No distress.  Eyes: Conjunctivae are normal. No scleral icterus.  Cardiovascular: Normal rate, regular rhythm, normal heart sounds and intact distal pulses.   No murmur heard. Pulmonary/Chest: Effort normal and breath sounds normal. No respiratory distress. He has no wheezes. He has no rales.  Musculoskeletal: Normal range of motion. He exhibits no edema.  Lymphadenopathy:       Left: Supraclavicular (2.5 cm in diameter, firm, mobile) adenopathy present.  Neurological: He is alert and oriented to person, place, and time. Coordination normal.  Skin: Skin is warm and dry. No rash noted. He is not diaphoretic.  Psychiatric: He has a normal mood and affect. His behavior is normal.  Nursing note and vitals reviewed.  Chest x-ray: No signs of acute cardiopulmonary abnormality, no does not show up on chest x-ray, no nodules noted, await final read from radiology    Assessment & Plan:   Problem List Items Addressed This Visit    None    Visit Diagnoses    Virchow's node    -  Primary   Lymph node upper mediastinum anteriorly, mobile, firm, will do chest x-ray and CT had neck and chest   Relevant Orders   DG Chest 2 View   CT SOFT TISSUE NECK W CONTRAST  CT CHEST W CONTRAST       Follow up plan: Return if symptoms worsen or fail to improve.  Counseling provided for all of the vaccine components Orders Placed This Encounter  Procedures  . DG Chest 2 View  . CT SOFT TISSUE NECK W CONTRAST  . CT CHEST [place], MD [facility] 05/27/2016, 1:14 PM

## 2016-06-06 ENCOUNTER — Other Ambulatory Visit: Payer: Self-pay

## 2016-06-06 ENCOUNTER — Telehealth: Payer: Self-pay | Admitting: Family Medicine

## 2016-06-06 DIAGNOSIS — D487 Neoplasm of uncertain behavior of other specified sites: Principal | ICD-10-CM

## 2016-06-06 DIAGNOSIS — R590 Localized enlarged lymph nodes: Secondary | ICD-10-CM

## 2016-06-06 NOTE — Telephone Encounter (Signed)
Pt would like somewhere in [place] or [place] as [place] will be harder for him to get to.

## 2016-06-10 ENCOUNTER — Other Ambulatory Visit: Payer: Self-pay | Admitting: Family Medicine

## 2016-06-10 DIAGNOSIS — R609 Edema, unspecified: Secondary | ICD-10-CM

## 2016-07-18 ENCOUNTER — Other Ambulatory Visit: Payer: Self-pay | Admitting: Nurse Practitioner

## 2016-07-19 NOTE — Telephone Encounter (Signed)
Patient has appointment on 08/06

## 2016-07-19 NOTE — Telephone Encounter (Signed)
Last lipid 07/10/15

## 2016-07-19 NOTE — Telephone Encounter (Signed)
Will refill this time but patient needs an appointment

## 2016-08-01 ENCOUNTER — Other Ambulatory Visit: Payer: Self-pay | Admitting: Family

## 2016-08-01 DIAGNOSIS — G609 Hereditary and idiopathic neuropathy, unspecified: Secondary | ICD-10-CM

## 2016-08-12 ENCOUNTER — Ambulatory Visit: Payer: Medicaid Other | Admitting: Family Medicine

## 2016-08-13 ENCOUNTER — Telehealth: Payer: Self-pay | Admitting: Family Medicine

## 2016-08-13 ENCOUNTER — Encounter: Payer: Self-pay | Admitting: Family Medicine

## 2016-08-28 ENCOUNTER — Ambulatory Visit (INDEPENDENT_AMBULATORY_CARE_PROVIDER_SITE_OTHER): Payer: Medicaid Other | Admitting: Family Medicine

## 2016-08-28 ENCOUNTER — Encounter: Payer: Self-pay | Admitting: Family Medicine

## 2016-08-28 ENCOUNTER — Encounter (INDEPENDENT_AMBULATORY_CARE_PROVIDER_SITE_OTHER): Payer: Self-pay

## 2016-08-28 VITALS — BP 135/71 | HR 87 | Temp 99.2°F | Ht 71.0 in | Wt 272.0 lb

## 2016-08-28 DIAGNOSIS — F411 Generalized anxiety disorder: Secondary | ICD-10-CM | POA: Diagnosis not present

## 2016-08-28 DIAGNOSIS — Z6837 Body mass index (BMI) 37.0-37.9, adult: Secondary | ICD-10-CM | POA: Diagnosis not present

## 2016-08-28 DIAGNOSIS — Z1211 Encounter for screening for malignant neoplasm of colon: Secondary | ICD-10-CM | POA: Diagnosis not present

## 2016-08-28 DIAGNOSIS — E785 Hyperlipidemia, unspecified: Secondary | ICD-10-CM | POA: Diagnosis not present

## 2016-08-28 DIAGNOSIS — R7309 Other abnormal glucose: Secondary | ICD-10-CM

## 2016-08-28 DIAGNOSIS — F325 Major depressive disorder, single episode, in full remission: Secondary | ICD-10-CM | POA: Diagnosis not present

## 2016-08-28 DIAGNOSIS — J441 Chronic obstructive pulmonary disease with (acute) exacerbation: Secondary | ICD-10-CM | POA: Diagnosis not present

## 2016-08-28 DIAGNOSIS — Z23 Encounter for immunization: Secondary | ICD-10-CM

## 2016-08-28 MED ORDER — FLUTICASONE PROPIONATE 50 MCG/ACT NA SUSP: 1.0000 | Freq: Two times a day (BID) | NASAL | 6 refills | Status: DC | PRN

## 2016-08-28 MED ORDER — AZITHROMYCIN 250 MG PO TABS: ORAL_TABLET | ORAL | 0 refills | Status: DC

## 2016-08-28 MED ORDER — PREDNISONE 20 MG PO TABS: ORAL_TABLET | ORAL | 0 refills | Status: DC

## 2016-08-28 NOTE — Progress Notes (Signed)
BP 135/71   Pulse 87   Temp 99.2 F (37.3 C) (Oral)   Ht '5\' 11"'  (1.803 m)   Wt 272 lb (123.4 kg)   BMI 37.94 kg/m    Subjective:    Patient ID: Kent Bryant, male    DOB: 1950-10-21, 66 y.o.   MRN: [id number]  HPI: Kent Bryant is a 66 y.o. male presenting on 08/28/2016 for Hyperlipidemia (followup) and Sinusitis (sinus congestion and pressure, slight cough)   HPI Hyperlipidemia Patient is coming in for recheck of his hyperlipidemia. The patient is currently taking Lipitor 40. They deny any issues with myalgias or history of liver damage from it. They deny any focal numbness or weakness or chest pain.   Anxiety and depression Patient is coming in for refill of medications for anxiety and depression. He currently uses trazodone and feels like things are doing well and controlled with that and does not need anything further at this point.  Cough and congestion and wheezing Patient has been having increased cough and congestion and wheezing and sinus congestion and head pressure. This is been increased over the past week or 2. He has been using Mucinex and other over-the-counter sinus medications without much success. His cough is mostly nonproductive.  Relevant past medical, surgical, family and social history reviewed and updated as indicated. Interim medical history since our last visit reviewed. Allergies and medications reviewed and updated.  Review of Systems  Constitutional: Negative for chills and fever.  HENT: Positive for congestion, postnasal drip, rhinorrhea, sinus pressure, sneezing and sore throat. Negative for ear discharge, ear pain and voice change.   Eyes: Negative for pain, discharge, redness and visual disturbance.  Respiratory: Positive for cough and wheezing. Negative for shortness of breath.   Cardiovascular: Negative for chest pain and leg swelling.  Musculoskeletal: Negative for back pain and gait problem.  Skin: Negative for rash.  Psychiatric/Behavioral:  Positive for decreased concentration and dysphoric mood. Negative for self-injury, sleep disturbance and suicidal ideas.  All other systems reviewed and are negative.   Per HPI unless specifically indicated above        Objective:    BP 135/71   Pulse 87   Temp 99.2 F (37.3 C) (Oral)   Ht '5\' 11"'  (1.803 m)   Wt 272 lb (123.4 kg)   BMI 37.94 kg/m   Wt Readings from Last 3 Encounters:  08/28/16 272 lb (123.4 kg)  05/27/16 261 lb (118.4 kg)  05/09/16 267 lb (121.1 kg)    Physical Exam  Constitutional: He is oriented to person, place, and time. He appears well-developed and well-nourished. No distress.  HENT:  Right Ear: Tympanic membrane, external ear and ear canal normal.  Left Ear: Tympanic membrane, external ear and ear canal normal.  Nose: Mucosal edema and rhinorrhea present. No sinus tenderness. No epistaxis. Right sinus exhibits maxillary sinus tenderness. Right sinus exhibits no frontal sinus tenderness. Left sinus exhibits maxillary sinus tenderness. Left sinus exhibits no frontal sinus tenderness.  Mouth/Throat: Uvula is midline and mucous membranes are normal. Posterior oropharyngeal edema and posterior oropharyngeal erythema present. No oropharyngeal exudate or tonsillar abscesses.  Eyes: Pupils are equal, round, and reactive to light. Conjunctivae and EOM are normal. Right eye exhibits no discharge. No scleral icterus.  Neck: Neck supple. No thyromegaly present.  Cardiovascular: Normal rate, regular rhythm, normal heart sounds and intact distal pulses.   No murmur heard. Pulmonary/Chest: Effort normal. No respiratory distress. He has wheezes. He has rhonchi. He has no rales.  Musculoskeletal: Normal range of motion. He exhibits no edema.  Lymphadenopathy:    He has no cervical adenopathy.  Neurological: He is alert and oriented to person, place, and time. Coordination normal.  Skin: Skin is warm and dry. No rash noted. He is not diaphoretic.  Psychiatric: His  behavior is normal. His mood appears anxious. He exhibits a depressed mood. He expresses no suicidal ideation. He expresses no suicidal plans.  Nursing note and vitals reviewed.       Assessment & Plan:   Problem List Items Addressed This Visit      Other   Major depression in remission ([place])   GAD (generalized anxiety disorder)   Hyperlipidemia with target LDL less than 130 - Primary   Relevant Orders   Lipid panel (Completed)   BMI 37.0-37.9, adult   Relevant Orders   CMP14+EGFR (Completed)    Other Visit Diagnoses    Colon cancer screening       Relevant Orders   Ambulatory referral to Gastroenterology   COPD with acute exacerbation ([place])       Mild exacerbation, will give Flonase, prednisone, Z-Pak   Relevant Medications   azithromycin (ZITHROMAX) 250 MG tablet   predniSONE (DELTASONE) 20 MG tablet   fluticasone (FLONASE) 50 MCG/ACT nasal spray   Elevated glucose           Follow up plan: Return in about 6 months (around 02/28/2017), or if symptoms worsen or fail to improve, for Recheck cholesterol in 6 months.  Counseling provided for all of the vaccine components Orders Placed This Encounter  Procedures  . Lipid panel  . CMP14+EGFR  . Ambulatory referral to Gastroenterology    Caryl Pina, MD [facility] 08/28/2016, 11:24 AM

## 2016-08-29 LAB — CMP14+EGFR
A/G RATIO: 1.3 (ref 1.2–2.2)
ALT: 17 IU/L (ref 0–44)
AST: 28 IU/L (ref 0–40)
Albumin: 3.6 g/dL (ref 3.6–4.8)
Alkaline Phosphatase: 112 IU/L (ref 39–117)
BILIRUBIN TOTAL: 1.1 mg/dL (ref 0.0–1.2)
BUN/Creatinine Ratio: 13 (ref 10–24)
BUN: 13 mg/dL (ref 8–27)
CHLORIDE: 95 mmol/L — AB (ref 96–106)
CO2: 28 mmol/L (ref 20–29)
Calcium: 9.1 mg/dL (ref 8.6–10.2)
Creatinine, Ser: 1.03 mg/dL (ref 0.76–1.27)
GFR calc non Af Amer: 76 mL/min/{1.73_m2} (ref >59)
GFR, EST AFRICAN AMERICAN: 88 mL/min/{1.73_m2} (ref >59)
Globulin, Total: 2.8 g/dL (ref 1.5–4.5)
Glucose: 159 mg/dL — ABNORMAL HIGH (ref 65–99)
POTASSIUM: 3.1 mmol/L — AB (ref 3.5–5.2)
SODIUM: 142 mmol/L (ref 134–144)
Total Protein: 6.4 g/dL (ref 6.0–8.5)

## 2016-08-29 LAB — LIPID PANEL
CHOL/HDL RATIO: 3 ratio (ref 0.0–5.0)
Cholesterol, Total: 105 mg/dL (ref 100–199)
HDL: 35 mg/dL — AB (ref >39)
LDL Calculated: 54 mg/dL (ref 0–99)
Triglycerides: 82 mg/dL (ref 0–149)
VLDL CHOLESTEROL CAL: 16 mg/dL (ref 5–40)

## 2016-08-30 ENCOUNTER — Other Ambulatory Visit: Payer: Self-pay | Admitting: *Deleted

## 2016-08-30 DIAGNOSIS — R7309 Other abnormal glucose: Secondary | ICD-10-CM

## 2016-08-30 LAB — BAYER DCA HB A1C WAIVED: HB A1C: 5.9 % (ref <7.0)

## 2016-09-11 ENCOUNTER — Encounter: Payer: Self-pay | Admitting: *Deleted

## 2016-09-17 ENCOUNTER — Telehealth: Payer: Self-pay | Admitting: Family Medicine

## 2016-09-17 NOTE — Telephone Encounter (Signed)
Patient called stating that he has had a rash on legs for a "while"  And sister has been letting patient use triamcinolone 1 but would like to have personal rx sent to pharmacy.

## 2016-09-18 MED ORDER — TRIAMCINOLONE ACETONIDE 0.1 % EX CREA: 1.0000 "application " | TOPICAL_CREAM | Freq: Two times a day (BID) | CUTANEOUS | 1 refills | Status: DC

## 2016-09-18 NOTE — Telephone Encounter (Signed)
Go ahead and send a prescription for triamcinolone 1%, give him the larger tube of 80 mg with one refill

## 2016-09-18 NOTE — Addendum Note (Signed)
Addended by: Marin Olp on: 09/18/2016 08:44 AM   Modules accepted: Orders

## 2016-09-25 ENCOUNTER — Other Ambulatory Visit: Payer: Self-pay | Admitting: Family Medicine

## 2016-09-25 DIAGNOSIS — G609 Hereditary and idiopathic neuropathy, unspecified: Secondary | ICD-10-CM

## 2016-11-18 ENCOUNTER — Other Ambulatory Visit: Payer: Self-pay | Admitting: Family Medicine

## 2016-11-18 ENCOUNTER — Other Ambulatory Visit: Payer: Self-pay | Admitting: Nurse Practitioner

## 2016-11-18 DIAGNOSIS — G609 Hereditary and idiopathic neuropathy, unspecified: Secondary | ICD-10-CM

## 2016-11-18 DIAGNOSIS — E876 Hypokalemia: Secondary | ICD-10-CM

## 2017-01-24 ENCOUNTER — Other Ambulatory Visit: Payer: Self-pay | Admitting: Family Medicine

## 2017-02-02 ENCOUNTER — Other Ambulatory Visit: Payer: Self-pay | Admitting: Family Medicine

## 2017-02-02 DIAGNOSIS — G609 Hereditary and idiopathic neuropathy, unspecified: Secondary | ICD-10-CM

## 2017-02-20 ENCOUNTER — Other Ambulatory Visit: Payer: Self-pay | Admitting: Family Medicine

## 2017-02-21 NOTE — Telephone Encounter (Signed)
OV 02/28/17

## 2017-02-28 ENCOUNTER — Ambulatory Visit: Payer: Medicare Other | Admitting: Family Medicine

## 2017-02-28 ENCOUNTER — Encounter: Payer: Self-pay | Admitting: Family Medicine

## 2017-02-28 VITALS — BP 132/69 | HR 77 | Temp 98.3°F | Ht 71.0 in | Wt 273.0 lb

## 2017-02-28 DIAGNOSIS — E785 Hyperlipidemia, unspecified: Secondary | ICD-10-CM

## 2017-02-28 DIAGNOSIS — Z23 Encounter for immunization: Secondary | ICD-10-CM | POA: Diagnosis not present

## 2017-02-28 DIAGNOSIS — F5104 Psychophysiologic insomnia: Secondary | ICD-10-CM

## 2017-02-28 MED ORDER — MELATONIN 5 MG PO TABS: 1.0000 | ORAL_TABLET | Freq: Every day | ORAL | 1 refills | Status: DC

## 2017-02-28 NOTE — Addendum Note (Signed)
Addended by: Michaela Corner on: 02/28/2017 01:15 PM   Modules accepted: Orders

## 2017-02-28 NOTE — Progress Notes (Signed)
BP 132/69   Pulse 77   Temp 98.3 F (36.8 C) (Oral)   Ht '5\' 11"'  (1.803 m)   Wt 273 lb (123.8 kg)   BMI 38.08 kg/m    Subjective:    Patient ID: Kent Bryant, male    DOB: December 22, 1950, 67 y.o.   MRN: [id number]  HPI: Kent Bryant is a 67 y.o. male presenting on 02/28/2017 for Hyperlipidemia (6 mo) and Insomnia   HPI Hyperlipidemia Patient is coming in for recheck of his hyperlipidemia. The patient is currently taking Lipitor. They deny any issues with myalgias or history of liver damage from it. They deny any focal numbness or weakness or chest pain.   Patient comes in complaining of insomnia and difficulty sleeping.  He does see his psychiatrist and has been on trazodone for this but says is not quite cutting and would like to try something different.  Because he sees psychiatry we will not adjust those medications but we will consider adding melatonin to his regiment and see if that can help him.  He says sleep onset is fine but he has difficulty staying asleep and often wakes up an hour to after he falls asleep.  He does admit to afternoon and p.m. napping which is probably part of the issue and I recommended that he not do that any further.  Relevant past medical, surgical, family and social history reviewed and updated as indicated. Interim medical history since our last visit reviewed. Allergies and medications reviewed and updated.  Review of Systems  Constitutional: Negative for chills and fever.  Eyes: Negative for discharge.  Respiratory: Negative for shortness of breath and wheezing.   Cardiovascular: Negative for chest pain and leg swelling.  Musculoskeletal: Negative for back pain and gait problem.  Skin: Negative for rash.  Neurological: Negative for dizziness, weakness, light-headedness and numbness.  All other systems reviewed and are negative.   Per HPI unless specifically indicated above   Allergies as of 02/28/2017   No Known Allergies     Medication List          Accurate as of 02/28/17 12:09 PM. Always use your most recent med list.          atorvastatin 40 MG tablet Commonly known as:  LIPITOR TAKE 1 TABLET (40 MG TOTAL) BY MOUTH DAILY AT 6 PM.   atorvastatin 40 MG tablet Commonly known as:  LIPITOR TAKE 1 TABLET (40 MG TOTAL) BY MOUTH DAILY AT 6 PM.   azithromycin 250 MG tablet Commonly known as:  ZITHROMAX Take 2 the first day and then one each day after.   cycloSPORINE 0.05 % ophthalmic emulsion Commonly known as:  RESTASIS Place 1 drop into both eyes daily.   fluticasone 50 MCG/ACT nasal spray Commonly known as:  FLONASE Place 1 spray into both nostrils 2 (two) times daily as needed for allergies or rhinitis.   furosemide 40 MG tablet Commonly known as:  LASIX Take 1 tablet (40 mg total) by mouth daily.   furosemide 20 MG tablet Commonly known as:  LASIX TAKE 2 TABLETS (40 MG TOTAL) BY MOUTH DAILY.   gabapentin 300 MG capsule Commonly known as:  NEURONTIN TAKE 2 CAPSULES (600 MG TOTAL) BY MOUTH 2 (TWO) TIMES DAILY.   KLOR-CON 10 10 MEQ tablet Generic drug:  potassium chloride TAKE 1 TABLET (10 MEQ TOTAL) BY MOUTH 2 (TWO) TIMES DAILY.   metolazone 2.5 MG tablet Commonly known as:  ZAROXOLYN Take 1 tablet (2.5 mg total) by mouth  daily.   predniSONE 20 MG tablet Commonly known as:  DELTASONE 2 po at same time daily for 5 days   traZODone 100 MG tablet Commonly known as:  DESYREL Take 1 tablet (100 mg total) by mouth at bedtime.   triamcinolone cream 0.1 % Commonly known as:  KENALOG Apply 1 application topically 2 (two) times daily.          Objective:    BP 132/69   Pulse 77   Temp 98.3 F (36.8 C) (Oral)   Ht '5\' 11"'  (1.803 m)   Wt 273 lb (123.8 kg)   BMI 38.08 kg/m   Wt Readings from Last 3 Encounters:  02/28/17 273 lb (123.8 kg)  08/28/16 272 lb (123.4 kg)  05/27/16 261 lb (118.4 kg)    Physical Exam  Constitutional: He is oriented to person, place, and time. He appears well-developed  and well-nourished. No distress.  Eyes: Conjunctivae are normal. No scleral icterus.  Neck: Neck supple. No thyromegaly present.  Cardiovascular: Normal rate, regular rhythm, normal heart sounds and intact distal pulses.  No murmur heard. Pulmonary/Chest: Effort normal and breath sounds normal. No respiratory distress. He has no wheezes. He has no rales.  Musculoskeletal: Normal range of motion. He exhibits no edema.  Lymphadenopathy:    He has no cervical adenopathy.  Neurological: He is alert and oriented to person, place, and time. Coordination normal.  Skin: Skin is warm and dry. No rash noted. He is not diaphoretic.  Psychiatric: He has a normal mood and affect. His behavior is normal.  Nursing note and vitals reviewed.   Results for orders placed or performed in visit on 08/28/16  Lipid panel  Result Value Ref Range   Cholesterol, Total 105 100 - 199 mg/dL   Triglycerides 82 0 - 149 mg/dL   HDL 35 (L) >39 mg/dL   VLDL Cholesterol Cal 16 5 - 40 mg/dL   LDL Calculated 54 0 - 99 mg/dL   Chol/HDL Ratio 3.0 0.0 - 5.0 ratio  CMP14+EGFR  Result Value Ref Range   Glucose 159 (H) 65 - 99 mg/dL   BUN 13 8 - 27 mg/dL   Creatinine, Ser 1.03 0.76 - 1.27 mg/dL   GFR calc non Af Amer 76 >59 mL/min/1.73   GFR calc Af Amer 88 >59 mL/min/1.73   BUN/Creatinine Ratio 13 10 - 24   Sodium 142 134 - 144 mmol/L   Potassium 3.1 (L) 3.5 - 5.2 mmol/L   Chloride 95 (L) 96 - 106 mmol/L   CO2 28 20 - 29 mmol/L   Calcium 9.1 8.6 - 10.2 mg/dL   Total Protein 6.4 6.0 - 8.5 g/dL   Albumin 3.6 3.6 - 4.8 g/dL   Globulin, Total 2.8 1.5 - 4.5 g/dL   Albumin/Globulin Ratio 1.3 1.2 - 2.2   Bilirubin Total 1.1 0.0 - 1.2 mg/dL   Alkaline Phosphatase 112 39 - 117 IU/L   AST 28 0 - 40 IU/L   ALT 17 0 - 44 IU/L  Bayer DCA Hb A1c Waived  Result Value Ref Range   Bayer DCA Hb A1c Waived 5.9 <7.0 %      Assessment & Plan:   Problem List Items Addressed This Visit      Other   Hyperlipidemia with target  LDL less than 130 - Primary   Relevant Orders   Lipid panel   Insomnia    Will give patient a prescription for melatonin  Follow up plan: Return in about 6 months (  around 08/28/2017), or if symptoms worsen or fail to improve, for hyperlipidemia.  Counseling provided for all of the vaccine components Orders Placed This Encounter  Procedures  . Lipid panel    Caryl Pina, MD [facility] 02/28/2017, 12:09 PM

## 2017-03-08 ENCOUNTER — Other Ambulatory Visit: Payer: Self-pay | Admitting: Family Medicine

## 2017-03-08 DIAGNOSIS — E876 Hypokalemia: Secondary | ICD-10-CM

## 2017-03-11 ENCOUNTER — Ambulatory Visit (INDEPENDENT_AMBULATORY_CARE_PROVIDER_SITE_OTHER): Payer: Medicare Other | Admitting: Family Medicine

## 2017-03-11 ENCOUNTER — Encounter: Payer: Self-pay | Admitting: Family Medicine

## 2017-03-11 VITALS — BP 133/71 | HR 84 | Temp 98.6°F | Ht 71.0 in | Wt 270.0 lb

## 2017-03-11 DIAGNOSIS — L03119 Cellulitis of unspecified part of limb: Secondary | ICD-10-CM

## 2017-03-11 DIAGNOSIS — L03115 Cellulitis of right lower limb: Secondary | ICD-10-CM | POA: Diagnosis not present

## 2017-03-11 DIAGNOSIS — L03116 Cellulitis of left lower limb: Secondary | ICD-10-CM

## 2017-03-11 MED ORDER — DOXYCYCLINE HYCLATE 100 MG PO TABS: 100.0000 mg | ORAL_TABLET | Freq: Two times a day (BID) | ORAL | 0 refills | Status: DC

## 2017-03-11 NOTE — Progress Notes (Signed)
BP 133/71 (BP Location: Left Arm, Patient Position: Sitting, Cuff Size: Large)   Pulse 84   Temp 98.6 F (37 C) (Oral)   Ht 5\' 11"  (1.803 m)   Wt 270 lb (122.5 kg)   BMI 37.66 kg/m    Subjective:    Patient ID: Kent Bryant, male    DOB: 05/30/1950, 67 y.o.   MRN: [id number]  HPI: Kent Bryant is a 66 y.o. male presenting on 03/11/2017 for No chief complaint on file.   HPI Bilateral swelling and drainage and edema Patient is coming in today with bilateral swelling and drainage and redness and warmth and pain in both of his legs.  He has chronically thought the swelling but the drainage and redness and pain has been a newer issue.  He denies any fevers or chills.  The pain extends from both feet all the way up to just under the knee where he is having both clear and thick drainage.  He says he has been soaking through his socks and pants very frequently because of the drainage.  He admits that he has not been using the compression stockings like he should.  He has not tried anything else for these.  Relevant past medical, surgical, family and social history reviewed and updated as indicated. Interim medical history since our last visit reviewed. Allergies and medications reviewed and updated.  Review of Systems  Constitutional: Negative for chills and fever.  Respiratory: Negative for shortness of breath and wheezing.   Cardiovascular: Positive for leg swelling. Negative for chest pain.  Musculoskeletal: Negative for back pain and gait problem.  Skin: Positive for color change. Negative for rash.  All other systems reviewed and are negative.   Per HPI unless specifically indicated above   Allergies as of 03/11/2017   No Known Allergies     Medication List        Accurate as of 03/11/17  4:38 PM. Always use your most recent med list.          atorvastatin 40 MG tablet Commonly known as:  LIPITOR TAKE 1 TABLET (40 MG TOTAL) BY MOUTH DAILY AT 6 PM.   atorvastatin 40 MG  tablet Commonly known as:  LIPITOR TAKE 1 TABLET (40 MG TOTAL) BY MOUTH DAILY AT 6 PM.   azithromycin 250 MG tablet Commonly known as:  ZITHROMAX Take 2 the first day and then one each day after.   cycloSPORINE 0.05 % ophthalmic emulsion Commonly known as:  RESTASIS Place 1 drop into both eyes daily.   doxycycline 100 MG tablet Commonly known as:  VIBRA-TABS Take 1 tablet (100 mg total) by mouth 2 (two) times daily. 1 po bid   fluticasone 50 MCG/ACT nasal spray Commonly known as:  FLONASE Place 1 spray into both nostrils 2 (two) times daily as needed for allergies or rhinitis.   furosemide 40 MG tablet Commonly known as:  LASIX Take 1 tablet (40 mg total) by mouth daily.   furosemide 20 MG tablet Commonly known as:  LASIX TAKE 2 TABLETS (40 MG TOTAL) BY MOUTH DAILY.   gabapentin 300 MG capsule Commonly known as:  NEURONTIN TAKE 2 CAPSULES (600 MG TOTAL) BY MOUTH 2 (TWO) TIMES DAILY.   KLOR-CON 10 10 MEQ tablet Generic drug:  potassium chloride TAKE 1 TABLET (10 MEQ TOTAL) BY MOUTH 2 (TWO) TIMES DAILY.   Melatonin 5 MG Tabs Take 1 tablet (5 mg total) by mouth at bedtime.   metolazone 2.5 MG tablet Commonly known as:  ZAROXOLYN Take 1 tablet (2.5 mg total) by mouth daily.   predniSONE 20 MG tablet Commonly known as:  DELTASONE 2 po at same time daily for 5 days   traZODone 100 MG tablet Commonly known as:  DESYREL Take 1 tablet (100 mg total) by mouth at bedtime.   triamcinolone cream 0.1 % Commonly known as:  KENALOG Apply 1 application topically 2 (two) times daily.          Objective:    BP 133/71 (BP Location: Left Arm, Patient Position: Sitting, Cuff Size: Large)   Pulse 84   Temp 98.6 F (37 C) (Oral)   Ht 5\' 11"  (1.803 m)   Wt 270 lb (122.5 kg)   BMI 37.66 kg/m   Wt Readings from Last 3 Encounters:  03/11/17 270 lb (122.5 kg)  02/28/17 273 lb (123.8 kg)  08/28/16 272 lb (123.4 kg)    Physical Exam  Constitutional: He is oriented to  person, place, and time. He appears well-developed and well-nourished. No distress.  Neck: Thyromegaly present.  Musculoskeletal: Normal range of motion. He exhibits edema (3+ pitting edema and also nonpitting edema in bilateral lower extremities, erythema with weeping and crusting on bilateral lower extremities extending from the upper third of the calf down to the foot.,  Tender to palpation).  Neurological: He is alert and oriented to person, place, and time. Coordination normal.  Skin: Skin is warm and dry. No rash noted. He is not diaphoretic.  Psychiatric: He has a normal mood and affect. His behavior is normal.  Nursing note and vitals reviewed.       Assessment & Plan:   Problem List Items Addressed This Visit    None    Visit Diagnoses    Cellulitis of lower extremity, unspecified laterality    -  Primary   Bilateral lower extremity edema with discharge, likely purulent, likely infected   Relevant Medications   doxycycline (VIBRA-TABS) 100 MG tablet   Other Relevant Orders   Unna boot       Follow up plan: Return in about 1 week (around 03/18/2017), or if symptoms worsen or fail to improve, for Recheck cellulitis and lower extremity edema.  Counseling provided for all of the vaccine components Orders Placed This Encounter  Procedures  . [place], MD [facility] 03/11/2017, 4:38 PM

## 2017-03-19 ENCOUNTER — Ambulatory Visit: Payer: Medicare Other | Admitting: Family Medicine

## 2017-03-19 ENCOUNTER — Encounter: Payer: Self-pay | Admitting: Family Medicine

## 2017-03-19 VITALS — BP 119/63 | HR 74 | Temp 98.5°F | Ht 71.0 in | Wt 270.0 lb

## 2017-03-19 DIAGNOSIS — R609 Edema, unspecified: Secondary | ICD-10-CM

## 2017-03-19 DIAGNOSIS — L03116 Cellulitis of left lower limb: Secondary | ICD-10-CM

## 2017-03-19 DIAGNOSIS — L03115 Cellulitis of right lower limb: Secondary | ICD-10-CM

## 2017-03-19 DIAGNOSIS — L03119 Cellulitis of unspecified part of limb: Secondary | ICD-10-CM

## 2017-03-23 NOTE — Progress Notes (Signed)
BP 119/63   Pulse 74   Temp 98.5 F (36.9 C) (Oral)   Ht 5\' 11"  (1.803 m)   Wt 270 lb (122.5 kg)   BMI 37.66 kg/m    Subjective:    Patient ID: Kent Bryant, male    DOB: [birth year], 67 y.o.   MRN: [id number]  HPI: Kent Bryant is a 67 y.o. male presenting on 03/19/2017 for Cellulitis (1 week follow up- bilateral legs)   HPI Edema and cellulitis recheck Patient is coming in for bilateral lower extremity swelling and edema recheck.  He did an Haematologist for this past week and his legs have looked a lot better not having the weeping and drainage that they were before and have significantly decreased in size.  He says the pain is resolved as well.  He denies any redness or warmth.  He denies any fevers or chills  Relevant past medical, surgical, family and social history reviewed and updated as indicated. Interim medical history since our last visit reviewed. Allergies and medications reviewed and updated.  Review of Systems  Constitutional: Negative for chills and fever.  HENT: Negative for ear pain and tinnitus.   Respiratory: Negative for cough, shortness of breath and wheezing.   Cardiovascular: Positive for leg swelling (Much improved and erythema is gone as well). Negative for chest pain and palpitations.  Skin: Negative for rash.  Neurological: Negative for dizziness, focal weakness, weakness and headaches.     Per HPI unless specifically indicated above   Allergies as of 03/19/2017   No Known Allergies                 Medication List             Accurate as of 03/19/17  1:59 PM. Always use your most recent med list.           atorvastatin 40 MG tablet Commonly known as:  LIPITOR TAKE 1 TABLET (40 MG TOTAL) BY MOUTH DAILY AT 6 PM.   atorvastatin 40 MG tablet Commonly known as:  LIPITOR TAKE 1 TABLET (40 MG TOTAL) BY MOUTH DAILY AT 6 PM.   cycloSPORINE 0.05 % ophthalmic emulsion Commonly known as:  RESTASIS Place 1 drop into both eyes daily.    fluticasone 50 MCG/ACT nasal spray Commonly known as:  FLONASE Place 1 spray into both nostrils 2 (two) times daily as needed for allergies or rhinitis.   furosemide 20 MG tablet Commonly known as:  LASIX TAKE 2 TABLETS (40 MG TOTAL) BY MOUTH DAILY.   gabapentin 300 MG capsule Commonly known as:  NEURONTIN TAKE 2 CAPSULES (600 MG TOTAL) BY MOUTH 2 (TWO) TIMES DAILY.   KLOR-CON 10 10 MEQ tablet Generic drug:  potassium chloride TAKE 1 TABLET (10 MEQ TOTAL) BY MOUTH 2 (TWO) TIMES DAILY.   Melatonin 5 MG Tabs Take 1 tablet (5 mg total) by mouth at bedtime.   metolazone 2.5 MG tablet Commonly known as:  ZAROXOLYN Take 1 tablet (2.5 mg total) by mouth daily.   traZODone 100 MG tablet Commonly known as:  DESYREL Take 1 tablet (100 mg total) by mouth at bedtime.   triamcinolone cream 0.1 % Commonly known as:  KENALOG Apply 1 application topically 2 (two) times daily.          Objective:  BP 119/63   Pulse 74   Temp 98.5 F (36.9 C) (Oral)   Ht 5\' 11"  (1.803 m)   Wt 270 lb (122.5 kg)   BMI 37.66 kg/m  Wt Readings from Last 3 Encounters:  03/19/17 270 lb (122.5 kg)  03/11/17 270 lb (122.5 kg)  02/28/17 273 lb (123.8 kg)    Physical Exam  Constitutional: He is oriented to person, place, and time. He appears well-developed and well-nourished. No distress.  Eyes: No scleral icterus.  Cardiovascular: Normal rate, regular rhythm, normal heart sounds and intact distal pulses.  No murmur heard. Pulmonary/Chest: Effort normal and breath sounds normal. No respiratory distress. He has no wheezes.  Musculoskeletal: Normal range of motion. He exhibits edema (1+ edema bilateral lower extremities).  Neurological: He is alert and oriented to person, place, and time. Coordination normal.  Skin: Skin is warm and dry. No rash noted. He is not diaphoretic. No erythema (No erythema, patient does have stasis dermatitis staining of both lower extremities).  Psychiatric:  He has a normal mood and affect. His behavior is normal.  Nursing note and vitals reviewed.   Nurse to apply Unna boots    Assessment & Plan:      Problem List Items Addressed This Visit          Other   Peripheral edema - Primary          Other Visit Diagnoses    Cellulitis of lower extremity, unspecified laterality       Cellulitis looks to be cleared up, looks like it is mostly the edema left, will do repeat Unna boot       Follow up plan: Return in about 1 week (around 03/26/2017), or if symptoms worsen or fail to improve, for Follow-up cellulitis and edema and Unna boot.  Counseling provided for all of the vaccine components No orders of the defined types were placed in this encounter.   Caryl Pina, MD [facility] 03/19/2017, 1:59 PM

## 2017-03-26 ENCOUNTER — Encounter: Payer: Self-pay | Admitting: Family Medicine

## 2017-03-26 ENCOUNTER — Ambulatory Visit: Payer: Medicare Other | Admitting: Family Medicine

## 2017-03-26 VITALS — BP 113/65 | HR 72 | Temp 97.1°F | Ht 71.0 in | Wt 266.0 lb

## 2017-03-26 DIAGNOSIS — R609 Edema, unspecified: Secondary | ICD-10-CM | POA: Diagnosis not present

## 2017-03-26 DIAGNOSIS — F17209 Nicotine dependence, unspecified, with unspecified nicotine-induced disorders: Secondary | ICD-10-CM | POA: Diagnosis not present

## 2017-03-26 DIAGNOSIS — Z716 Tobacco abuse counseling: Secondary | ICD-10-CM

## 2017-03-26 DIAGNOSIS — J441 Chronic obstructive pulmonary disease with (acute) exacerbation: Secondary | ICD-10-CM | POA: Insufficient documentation

## 2017-03-26 DIAGNOSIS — L03119 Cellulitis of unspecified part of limb: Secondary | ICD-10-CM

## 2017-03-26 NOTE — Progress Notes (Signed)
BP 113/65   Pulse 72   Temp (!) 97.1 F (36.2 C) (Oral)   Ht 5\' 11"  (1.803 m)   Wt 266 lb (120.7 kg)   BMI 37.10 kg/m    Subjective:    Patient ID: Kent Bryant, male    DOB: 29-Mar-1950, 67 y.o.   MRN: [id number]  HPI: Leman Martinek is a 67 y.o. male presenting on 03/26/2017 for Edema, cellulitis   HPI Recheck bilateral leg swelling and edema and cellulitis Patient had a second week of Unna boot on both of his legs and all the sores appear to be healed and there is no sign of erythema or warmth or drainage and the edema is down to 1+ now and is looking a lot better.  Patient denies any pain in either of his legs now or redness or warmth.  Patient is a current smoker and has no desire to quit currently, we discussed how this would affect his blood flow to his legs.  Relevant past medical, surgical, family and social history reviewed and updated as indicated. Interim medical history since our last visit reviewed. Allergies and medications reviewed and updated.  Review of Systems  Constitutional: Negative for chills and fever.  Respiratory: Negative for shortness of breath and wheezing.   Cardiovascular: Positive for leg swelling. Negative for chest pain.  Musculoskeletal: Negative for back pain and gait problem.  Skin: Positive for color change (Dark color staining). Negative for rash and wound.  All other systems reviewed and are negative.   Per HPI unless specifically indicated above   Allergies as of 03/26/2017   No Known Allergies     Medication List        Accurate as of 03/26/17  9:46 AM. Always use your most recent med list.          atorvastatin 40 MG tablet Commonly known as:  LIPITOR TAKE 1 TABLET (40 MG TOTAL) BY MOUTH DAILY AT 6 PM.   cycloSPORINE 0.05 % ophthalmic emulsion Commonly known as:  RESTASIS Place 1 drop into both eyes daily.   fluticasone 50 MCG/ACT nasal spray Commonly known as:  FLONASE Place 1 spray into both nostrils 2 (two) times daily as  needed for allergies or rhinitis.   furosemide 20 MG tablet Commonly known as:  LASIX TAKE 2 TABLETS (40 MG TOTAL) BY MOUTH DAILY.   gabapentin 300 MG capsule Commonly known as:  NEURONTIN TAKE 2 CAPSULES (600 MG TOTAL) BY MOUTH 2 (TWO) TIMES DAILY.   KLOR-CON 10 10 MEQ tablet Generic drug:  potassium chloride TAKE 1 TABLET (10 MEQ TOTAL) BY MOUTH 2 (TWO) TIMES DAILY.   Melatonin 5 MG Tabs Take 1 tablet (5 mg total) by mouth at bedtime.   metolazone 2.5 MG tablet Commonly known as:  ZAROXOLYN Take 1 tablet (2.5 mg total) by mouth daily.   traZODone 100 MG tablet Commonly known as:  DESYREL Take 1 tablet (100 mg total) by mouth at bedtime.   triamcinolone cream 0.1 % Commonly known as:  KENALOG Apply 1 application topically 2 (two) times daily.          Objective:    BP 113/65   Pulse 72   Temp (!) 97.1 F (36.2 C) (Oral)   Ht 5\' 11"  (1.803 m)   Wt 266 lb (120.7 kg)   BMI 37.10 kg/m   Wt Readings from Last 3 Encounters:  03/26/17 266 lb (120.7 kg)  03/19/17 270 lb (122.5 kg)  03/11/17 270 lb (122.5 kg)  Physical Exam  Constitutional: He is oriented to person, place, and time. He appears well-developed and well-nourished. No distress.  Eyes: Conjunctivae are normal. No scleral icterus.  Musculoskeletal: Normal range of motion. He exhibits edema (1+ edema).  Neurological: He is alert and oriented to person, place, and time. Coordination normal.  Skin: Skin is warm and dry. No laceration, no lesion and no rash noted. He is not diaphoretic. No erythema.  Psychiatric: He has a normal mood and affect. His behavior is normal.  Nursing note and vitals reviewed.       Assessment & Plan:   Problem List Items Addressed This Visit      Other   Peripheral edema - Primary    Other Visit Diagnoses    Cellulitis of lower extremity, unspecified laterality       Tobacco use disorder, continuous       Tobacco abuse counseling         Instructed patient to use  Ace bandages on bilateral lower extremities from the toes all the way up to the knee and wrapped them to help keep the fluid compression and prevent edema from returning  Follow up plan: Return if symptoms worsen or fail to improve.  Counseling provided for all of the vaccine components No orders of the defined types were placed in this encounter.   Caryl Pina, MD [facility] 03/26/2017, 9:46 AM

## 2017-04-02 ENCOUNTER — Encounter: Payer: Self-pay | Admitting: Family Medicine

## 2017-04-02 ENCOUNTER — Ambulatory Visit: Payer: Medicare Other | Admitting: Family Medicine

## 2017-04-02 VITALS — BP 136/77 | HR 78 | Temp 97.6°F | Ht 71.0 in | Wt 267.0 lb

## 2017-04-02 DIAGNOSIS — F325 Major depressive disorder, single episode, in full remission: Secondary | ICD-10-CM | POA: Diagnosis not present

## 2017-04-02 DIAGNOSIS — E785 Hyperlipidemia, unspecified: Secondary | ICD-10-CM

## 2017-04-02 DIAGNOSIS — Z6837 Body mass index (BMI) 37.0-37.9, adult: Secondary | ICD-10-CM

## 2017-04-02 DIAGNOSIS — J441 Chronic obstructive pulmonary disease with (acute) exacerbation: Secondary | ICD-10-CM | POA: Diagnosis not present

## 2017-04-02 DIAGNOSIS — G609 Hereditary and idiopathic neuropathy, unspecified: Secondary | ICD-10-CM

## 2017-04-02 DIAGNOSIS — F411 Generalized anxiety disorder: Secondary | ICD-10-CM

## 2017-04-02 NOTE — Progress Notes (Signed)
BP 136/77   Pulse 78   Temp 97.6 F (36.4 C) (Oral)   Ht '5\' 11"'  (1.803 m)   Wt 267 lb (121.1 kg)   BMI 37.24 kg/m    Subjective:    Patient ID: Kent Bryant, male    DOB: Jan 02, 1951, 67 y.o.   MRN: [id number]  HPI: Kent Bryant is a 67 y.o. male presenting on 04/02/2017 for Hyperlipidemia (follow up, patient is fasting)   HPI Hyperlipidemia Patient is coming in for recheck of his hyperlipidemia. The patient is currently taking Lipitor. They deny any issues with myalgias or history of liver damage from it. They deny any focal numbness or weakness or chest pain.   COPD Patient is coming in for COPD recheck today.  He is currently on no inhalers because he has difficulties affording them, we have often given him samples of different ones including Symbicort and Brio.  He has a mild chronic cough but denies any major coughing spells or wheezing spells.  He has 2nighttime symptoms per week and 2daytime symptoms per week currently.   Anxiety and depression and neuropathy Patient is coming in for recheck on anxiety and depression and neuropathy.  He is currently taking gabapentin and trazodone which is helping with this.  He says he is doing well on it and denies any major issues.  He denies any suicidal ideations or thoughts of hurting himself.  He says his neuropathy is mostly controlled.  Relevant past medical, surgical, family and social history reviewed and updated as indicated. Interim medical history since our last visit reviewed. Allergies and medications reviewed and updated.  Review of Systems  Constitutional: Negative for chills and fever.  HENT: Positive for congestion. Negative for ear discharge, ear pain, postnasal drip, rhinorrhea, sinus pressure, sneezing, sore throat and voice change.   Eyes: Negative for pain, discharge, redness and visual disturbance.  Respiratory: Positive for cough and wheezing. Negative for shortness of breath.   Cardiovascular: Negative for chest pain  and leg swelling.  Musculoskeletal: Negative for gait problem.  Skin: Negative for rash.  Neurological: Positive for numbness. Negative for dizziness and weakness.  All other systems reviewed and are negative.   Per HPI unless specifically indicated above   Allergies as of 04/02/2017   No Known Allergies     Medication List        Accurate as of 04/02/17 11:59 PM. Always use your most recent med list.          atorvastatin 40 MG tablet Commonly known as:  LIPITOR TAKE 1 TABLET (40 MG TOTAL) BY MOUTH DAILY AT 6 PM.   cycloSPORINE 0.05 % ophthalmic emulsion Commonly known as:  RESTASIS Place 1 drop into both eyes daily.   fluticasone 50 MCG/ACT nasal spray Commonly known as:  FLONASE Place 1 spray into both nostrils 2 (two) times daily as needed for allergies or rhinitis.   furosemide 20 MG tablet Commonly known as:  LASIX TAKE 2 TABLETS (40 MG TOTAL) BY MOUTH DAILY.   gabapentin 300 MG capsule Commonly known as:  NEURONTIN TAKE 2 CAPSULES (600 MG TOTAL) BY MOUTH 2 (TWO) TIMES DAILY.   KLOR-CON 10 10 MEQ tablet Generic drug:  potassium chloride TAKE 1 TABLET (10 MEQ TOTAL) BY MOUTH 2 (TWO) TIMES DAILY.   Melatonin 5 MG Tabs Take 1 tablet (5 mg total) by mouth at bedtime.   metolazone 2.5 MG tablet Commonly known as:  ZAROXOLYN Take 1 tablet (2.5 mg total) by mouth daily.  traZODone 100 MG tablet Commonly known as:  DESYREL Take 1 tablet (100 mg total) by mouth at bedtime.   triamcinolone cream 0.1 % Commonly known as:  KENALOG Apply 1 application topically 2 (two) times daily.          Objective:    BP 136/77   Pulse 78   Temp 97.6 F (36.4 C) (Oral)   Ht '5\' 11"'  (1.803 m)   Wt 267 lb (121.1 kg)   BMI 37.24 kg/m   Wt Readings from Last 3 Encounters:  04/02/17 267 lb (121.1 kg)  03/26/17 266 lb (120.7 kg)  03/19/17 270 lb (122.5 kg)    Physical Exam  Constitutional: He is oriented to person, place, and time. He appears well-developed and  well-nourished. No distress.  Eyes: Conjunctivae are normal. No scleral icterus.  Neck: Neck supple. No thyromegaly present.  Cardiovascular: Normal rate, regular rhythm, normal heart sounds and intact distal pulses.  No murmur heard. Pulmonary/Chest: Effort normal and breath sounds normal. No respiratory distress. He has no wheezes. He has no rales.  Musculoskeletal: Normal range of motion. He exhibits no edema.  Neurological: He is alert and oriented to person, place, and time. Coordination normal.  Skin: Skin is warm and dry. No rash noted. He is not diaphoretic.  Psychiatric: He has a normal mood and affect. His behavior is normal.  Nursing note and vitals reviewed.       Assessment & Plan:   Problem List Items Addressed This Visit      Respiratory   COPD with acute exacerbation ([place])     Nervous and Auditory   Peripheral neuropathy, idiopathic     Other   Major depression in remission ([place])   GAD (generalized anxiety disorder)   Relevant Orders   CBC with Differential/Platelet (Completed)   Hyperlipidemia with target LDL less than 130 - Primary   Relevant Orders   Lipid panel (Completed)   BMI 37.0-37.9, adult   Relevant Orders   CMP14+EGFR (Completed)      Patient refuses maintenance inhaler because of affordability.  He says that he is feeling better.  Follow up plan: Return in about 3 months (around 07/03/2017), or if symptoms worsen or fail to improve, for Recheck COPD and cholesterol and depression.  Counseling provided for all of the vaccine components Orders Placed This Encounter  Procedures  . CBC with Differential/Platelet  . CMP14+EGFR  . Lipid panel    Caryl Pina, MD [facility] 04/04/2017, 1:48 PM

## 2017-04-03 LAB — CBC WITH DIFFERENTIAL/PLATELET
BASOS ABS: 0 10*3/uL (ref 0.0–0.2)
BASOS: 0 %
EOS (ABSOLUTE): 0.2 10*3/uL (ref 0.0–0.4)
EOS: 3 %
HEMATOCRIT: 42.2 % (ref 37.5–51.0)
HEMOGLOBIN: 14.2 g/dL (ref 13.0–17.7)
IMMATURE GRANS (ABS): 0 10*3/uL (ref 0.0–0.1)
Immature Granulocytes: 0 %
Lymphocytes Absolute: 1.6 10*3/uL (ref 0.7–3.1)
Lymphs: 21 %
MCH: 30.9 pg (ref 26.6–33.0)
MCHC: 33.6 g/dL (ref 31.5–35.7)
MCV: 92 fL (ref 79–97)
MONOCYTES: 13 %
Monocytes Absolute: 1 10*3/uL — ABNORMAL HIGH (ref 0.1–0.9)
NEUTROS ABS: 4.9 10*3/uL (ref 1.4–7.0)
Neutrophils: 63 %
Platelets: 129 10*3/uL — ABNORMAL LOW (ref 150–379)
RBC: 4.59 x10E6/uL (ref 4.14–5.80)
RDW: 16.8 % — ABNORMAL HIGH (ref 12.3–15.4)
WBC: 7.6 10*3/uL (ref 3.4–10.8)

## 2017-04-03 LAB — CMP14+EGFR
ALBUMIN: 3.5 g/dL — AB (ref 3.6–4.8)
ALT: 18 IU/L (ref 0–44)
AST: 34 IU/L (ref 0–40)
Albumin/Globulin Ratio: 1 — ABNORMAL LOW (ref 1.2–2.2)
Alkaline Phosphatase: 127 IU/L — ABNORMAL HIGH (ref 39–117)
BILIRUBIN TOTAL: 1.8 mg/dL — AB (ref 0.0–1.2)
BUN / CREAT RATIO: 14 (ref 10–24)
BUN: 13 mg/dL (ref 8–27)
CHLORIDE: 94 mmol/L — AB (ref 96–106)
CO2: 31 mmol/L — ABNORMAL HIGH (ref 20–29)
CREATININE: 0.94 mg/dL (ref 0.76–1.27)
Calcium: 9 mg/dL (ref 8.6–10.2)
GFR calc non Af Amer: 85 mL/min/{1.73_m2} (ref >59)
GFR, EST AFRICAN AMERICAN: 98 mL/min/{1.73_m2} (ref >59)
GLOBULIN, TOTAL: 3.4 g/dL (ref 1.5–4.5)
GLUCOSE: 96 mg/dL (ref 65–99)
Potassium: 3.2 mmol/L — ABNORMAL LOW (ref 3.5–5.2)
SODIUM: 138 mmol/L (ref 134–144)
TOTAL PROTEIN: 6.9 g/dL (ref 6.0–8.5)

## 2017-04-03 LAB — LIPID PANEL
Chol/HDL Ratio: 3.4 ratio (ref 0.0–5.0)
Cholesterol, Total: 92 mg/dL — ABNORMAL LOW (ref 100–199)
HDL: 27 mg/dL — ABNORMAL LOW (ref >39)
LDL CALC: 52 mg/dL (ref 0–99)
Triglycerides: 67 mg/dL (ref 0–149)
VLDL Cholesterol Cal: 13 mg/dL (ref 5–40)

## 2017-04-04 ENCOUNTER — Other Ambulatory Visit: Payer: Self-pay | Admitting: Family Medicine

## 2017-04-04 ENCOUNTER — Other Ambulatory Visit: Payer: Self-pay

## 2017-04-04 DIAGNOSIS — R945 Abnormal results of liver function studies: Principal | ICD-10-CM

## 2017-04-04 DIAGNOSIS — R7989 Other specified abnormal findings of blood chemistry: Secondary | ICD-10-CM

## 2017-04-04 DIAGNOSIS — G609 Hereditary and idiopathic neuropathy, unspecified: Secondary | ICD-10-CM

## 2017-04-04 MED ORDER — POTASSIUM CHLORIDE ER 10 MEQ PO TBCR: EXTENDED_RELEASE_TABLET | ORAL | 1 refills | Status: DC

## 2017-04-09 ENCOUNTER — Other Ambulatory Visit: Payer: Medicaid Other

## 2017-04-09 DIAGNOSIS — R945 Abnormal results of liver function studies: Principal | ICD-10-CM

## 2017-04-09 DIAGNOSIS — R7989 Other specified abnormal findings of blood chemistry: Secondary | ICD-10-CM

## 2017-04-10 LAB — CMP14+EGFR
A/G RATIO: 1.1 — AB (ref 1.2–2.2)
ALK PHOS: 144 IU/L — AB (ref 39–117)
ALT: 17 IU/L (ref 0–44)
AST: 31 IU/L (ref 0–40)
Albumin: 3.6 g/dL (ref 3.6–4.8)
BILIRUBIN TOTAL: 1.5 mg/dL — AB (ref 0.0–1.2)
BUN/Creatinine Ratio: 11 (ref 10–24)
BUN: 10 mg/dL (ref 8–27)
CHLORIDE: 93 mmol/L — AB (ref 96–106)
CO2: 29 mmol/L (ref 20–29)
Calcium: 9.2 mg/dL (ref 8.6–10.2)
Creatinine, Ser: 0.94 mg/dL (ref 0.76–1.27)
GFR calc non Af Amer: 85 mL/min/{1.73_m2} (ref >59)
GFR, EST AFRICAN AMERICAN: 98 mL/min/{1.73_m2} (ref >59)
Globulin, Total: 3.4 g/dL (ref 1.5–4.5)
Glucose: 95 mg/dL (ref 65–99)
POTASSIUM: 3.6 mmol/L (ref 3.5–5.2)
SODIUM: 137 mmol/L (ref 134–144)
TOTAL PROTEIN: 7 g/dL (ref 6.0–8.5)

## 2017-04-22 ENCOUNTER — Ambulatory Visit (HOSPITAL_COMMUNITY)
Admission: RE | Admit: 2017-04-22 | Discharge: 2017-04-22 | Disposition: A | Discharge status: Home / Self Care | Payer: Medicaid Other | Source: Ambulatory Visit | Attending: Family Medicine | Admitting: Family Medicine

## 2017-04-22 DIAGNOSIS — R188 Other ascites: Secondary | ICD-10-CM | POA: Insufficient documentation

## 2017-04-22 DIAGNOSIS — J90 Pleural effusion, not elsewhere classified: Secondary | ICD-10-CM | POA: Insufficient documentation

## 2017-04-22 DIAGNOSIS — R7989 Other specified abnormal findings of blood chemistry: Secondary | ICD-10-CM

## 2017-04-22 DIAGNOSIS — R945 Abnormal results of liver function studies: Secondary | ICD-10-CM | POA: Diagnosis present

## 2017-04-22 DIAGNOSIS — K769 Liver disease, unspecified: Secondary | ICD-10-CM | POA: Diagnosis not present

## 2017-04-23 ENCOUNTER — Other Ambulatory Visit: Payer: Self-pay | Admitting: Family Medicine

## 2017-04-23 DIAGNOSIS — K760 Fatty (change of) liver, not elsewhere classified: Secondary | ICD-10-CM

## 2017-04-23 NOTE — Progress Notes (Signed)
No [id number]

## 2017-04-23 NOTE — Progress Notes (Unsigned)
Ultrasound of right upper quadrant showed possible ascites and possible cirrhosis, will do CT abdomen pelvis with contrast

## 2017-04-23 NOTE — Progress Notes (Signed)
Patient notified of results and that we are setting up a CT. Patient verbalized understanding

## 2017-05-07 ENCOUNTER — Other Ambulatory Visit: Payer: Self-pay | Admitting: Family

## 2017-05-07 DIAGNOSIS — R609 Edema, unspecified: Secondary | ICD-10-CM

## 2017-05-23 ENCOUNTER — Ambulatory Visit (HOSPITAL_COMMUNITY): Admission: RE | Admit: 2017-05-23 | Payer: Medicaid Other | Source: Ambulatory Visit

## 2017-05-29 HISTORY — PX: CATARACT EXTRACTION: SUR2

## 2017-06-06 NOTE — Patient Instructions (Signed)
Your procedure is scheduled on: 06/13/2017   Report to [facility] at  935   AM.  Call this number if you have problems the morning of surgery: [phone]   Do not eat food or drink liquids :After Midnight.      Take these medicines the morning of surgery with A SIP OF WATER: gabapentin   Do not wear jewelry, make-up or nail polish.  Do not wear lotions, powders, or perfumes. You may wear deodorant.  Do not shave 48 hours prior to surgery.  Do not bring valuables to the hospital.  Contacts, dentures or bridgework may not be worn into surgery.  Leave suitcase in the car. After surgery it may be brought to your room.  For patients admitted to the hospital, checkout time is 11:00 AM the day of discharge.   Patients discharged the day of surgery will not be allowed to drive home.  :     Please read over the following fact sheets that you were given: Coughing and Deep Breathing, Surgical Site Infection Prevention, Anesthesia Post-op Instructions and Care and Recovery After Surgery    Cataract A cataract is a clouding of the lens of the eye. When a lens becomes cloudy, vision is reduced based on the degree and nature of the clouding. Many cataracts reduce vision to some degree. Some cataracts make people more near-sighted as they develop. Other cataracts increase glare. Cataracts that are ignored and become worse can sometimes look white. The white color can be seen through the pupil. CAUSES   Aging. However, cataracts may occur at any age, even in newborns.   Certain drugs.   Trauma to the eye.   Certain diseases such as diabetes.   Specific eye diseases such as chronic inflammation inside the eye or a sudden attack of a rare form of glaucoma.   Inherited or acquired medical problems.  SYMPTOMS   Gradual, progressive drop in vision in the affected eye.   Severe, rapid visual loss. This most often happens when trauma is the cause.  DIAGNOSIS  To detect a cataract, an eye doctor  examines the lens. Cataracts are best diagnosed with an exam of the eyes with the pupils enlarged (dilated) by drops.  TREATMENT  For an early cataract, vision may improve by using different eyeglasses or stronger lighting. If that does not help your vision, surgery is the only effective treatment. A cataract needs to be surgically removed when vision loss interferes with your everyday activities, such as driving, reading, or watching TV. A cataract may also have to be removed if it prevents examination or treatment of another eye problem. Surgery removes the cloudy lens and usually replaces it with a substitute lens (intraocular lens, IOL).  At a time when both you and your doctor agree, the cataract will be surgically removed. If you have cataracts in both eyes, only one is usually removed at a time. This allows the operated eye to heal and be out of danger from any possible problems after surgery (such as infection or poor wound healing). In rare cases, a cataract may be doing damage to your eye. In these cases, your caregiver may advise surgical removal right away. The vast majority of people who have cataract surgery have better vision afterward. HOME CARE INSTRUCTIONS  If you are not planning surgery, you may be asked to do the following:  Use different eyeglasses.   Use stronger or brighter lighting.   Ask your eye doctor about reducing your medicine  dose or changing medicines if it is thought that a medicine caused your cataract. Changing medicines does not make the cataract go away on its own.   Become familiar with your surroundings. Poor vision can lead to injury. Avoid bumping into things on the affected side. You are at a higher risk for tripping or falling.   Exercise extreme care when driving or operating machinery.   Wear sunglasses if you are sensitive to bright light or experiencing problems with glare.  SEEK IMMEDIATE MEDICAL CARE IF:   You have a worsening or sudden vision  loss.   You notice redness, swelling, or increasing pain in the eye.   You have a fever.  Document Released: 12/24/2004 Document Revised: 12/13/2010 Document Reviewed: 08/17/2010 [facility] Patient Information 2012 [place].PATIENT INSTRUCTIONS POST-ANESTHESIA  IMMEDIATELY FOLLOWING SURGERY:  Do not drive or operate machinery for the first twenty four hours after surgery.  Do not make any important decisions for twenty four hours after surgery or while taking narcotic pain medications or sedatives.  If you develop intractable nausea and vomiting or a severe headache please notify your doctor immediately.  FOLLOW-UP:  Please make an appointment with your surgeon as instructed. You do not need to follow up with anesthesia unless specifically instructed to do so.  WOUND CARE INSTRUCTIONS (if applicable):  Keep a dry clean dressing on the anesthesia/puncture wound site if there is drainage.  Once the wound has quit draining you may leave it open to air.  Generally you should leave the bandage intact for twenty four hours unless there is drainage.  If the epidural site drains for more than 36-48 hours please call the anesthesia department.  QUESTIONS?:  Please feel free to call your physician or the hospital operator if you have any questions, and they will be happy to assist you.

## 2017-06-10 ENCOUNTER — Encounter: Payer: Self-pay | Admitting: Physician Assistant

## 2017-06-10 ENCOUNTER — Encounter (HOSPITAL_COMMUNITY)
Admission: RE | Admit: 2017-06-10 | Discharge: 2017-06-10 | Disposition: A | Discharge status: Home / Self Care | Payer: Medicare Other | Source: Ambulatory Visit | Attending: Ophthalmology | Admitting: Ophthalmology

## 2017-06-10 ENCOUNTER — Ambulatory Visit (INDEPENDENT_AMBULATORY_CARE_PROVIDER_SITE_OTHER): Payer: Medicare Other | Admitting: Physician Assistant

## 2017-06-10 ENCOUNTER — Other Ambulatory Visit: Payer: Self-pay

## 2017-06-10 ENCOUNTER — Encounter (HOSPITAL_COMMUNITY): Payer: Self-pay

## 2017-06-10 VITALS — BP 136/72 | HR 70 | Temp 96.8°F | Ht 71.0 in | Wt 252.4 lb

## 2017-06-10 DIAGNOSIS — I10 Essential (primary) hypertension: Secondary | ICD-10-CM | POA: Diagnosis not present

## 2017-06-10 DIAGNOSIS — E7800 Pure hypercholesterolemia, unspecified: Secondary | ICD-10-CM | POA: Diagnosis not present

## 2017-06-10 DIAGNOSIS — F329 Major depressive disorder, single episode, unspecified: Secondary | ICD-10-CM | POA: Diagnosis not present

## 2017-06-10 DIAGNOSIS — R609 Edema, unspecified: Secondary | ICD-10-CM

## 2017-06-10 DIAGNOSIS — Z01812 Encounter for preprocedural laboratory examination: Secondary | ICD-10-CM

## 2017-06-10 DIAGNOSIS — Z01810 Encounter for preprocedural cardiovascular examination: Secondary | ICD-10-CM

## 2017-06-10 DIAGNOSIS — R9431 Abnormal electrocardiogram [ECG] [EKG]: Secondary | ICD-10-CM

## 2017-06-10 DIAGNOSIS — H25812 Combined forms of age-related cataract, left eye: Secondary | ICD-10-CM | POA: Diagnosis present

## 2017-06-10 DIAGNOSIS — L03116 Cellulitis of left lower limb: Secondary | ICD-10-CM

## 2017-06-10 DIAGNOSIS — F419 Anxiety disorder, unspecified: Secondary | ICD-10-CM | POA: Diagnosis not present

## 2017-06-10 DIAGNOSIS — Z79899 Other long term (current) drug therapy: Secondary | ICD-10-CM | POA: Diagnosis not present

## 2017-06-10 LAB — CBC WITH DIFFERENTIAL/PLATELET
BASOS ABS: 0 10*3/uL (ref 0.0–0.1)
BASOS PCT: 0 %
Eosinophils Absolute: 0.2 10*3/uL (ref 0.0–0.7)
Eosinophils Relative: 3 %
HEMATOCRIT: 40.7 % (ref 39.0–52.0)
HEMOGLOBIN: 13.4 g/dL (ref 13.0–17.0)
Lymphocytes Relative: 22 %
Lymphs Abs: 1.8 10*3/uL (ref 0.7–4.0)
MCH: 30.4 pg (ref 26.0–34.0)
MCHC: 32.9 g/dL (ref 30.0–36.0)
MCV: 92.3 fL (ref 78.0–100.0)
MONO ABS: 1.1 10*3/uL — AB (ref 0.1–1.0)
Monocytes Relative: 13 %
NEUTROS ABS: 5.1 10*3/uL (ref 1.7–7.7)
NEUTROS PCT: 62 %
Platelets: 154 10*3/uL (ref 150–400)
RBC: 4.41 MIL/uL (ref 4.22–5.81)
RDW: 16.9 % — AB (ref 11.5–15.5)
WBC: 8.1 10*3/uL (ref 4.0–10.5)

## 2017-06-10 LAB — BASIC METABOLIC PANEL
ANION GAP: 9 (ref 5–15)
BUN: 15 mg/dL (ref 6–20)
CALCIUM: 8.6 mg/dL — AB (ref 8.9–10.3)
CO2: 34 mmol/L — AB (ref 22–32)
CREATININE: 0.91 mg/dL (ref 0.61–1.24)
Chloride: 94 mmol/L — ABNORMAL LOW (ref 101–111)
GFR calc non Af Amer: >60 mL/min (ref >60)
Glucose, Bld: 84 mg/dL (ref 65–99)
Potassium: 3.1 mmol/L — ABNORMAL LOW (ref 3.5–5.1)
Sodium: 137 mmol/L (ref 135–145)

## 2017-06-10 MED ORDER — CEPHALEXIN 500 MG PO CAPS: 500.0000 mg | ORAL_CAPSULE | Freq: Four times a day (QID) | ORAL | 0 refills | Status: DC

## 2017-06-10 NOTE — Patient Instructions (Signed)
In a few days you may receive a survey in the mail or online from [employer] regarding your visit with us today. Please take a moment to fill this out. Your feedback is very important to our whole office. It can help us better understand your needs as well as improve your experience and satisfaction. Thank you for taking your time to complete it. We care about you.  Donye Campanelli, PA-C  

## 2017-06-11 NOTE — Progress Notes (Signed)
BP 136/72   Pulse 70   Temp (!) 96.8 F (36 C) (Oral)   Ht 5\' 11"  (1.803 m)   Wt 252 lb 6.4 oz (114.5 kg)   BMI 35.20 kg/m     Subjective:    Patient ID: Kent Bryant, male    DOB: 1950-05-31, 67 y.o.   MRN: [id number]  HPI: Kent Bryant is a 67 y.o. male presenting on 06/10/2017 for Skin Ulcer (left outer leg continues to bleed)  This patient comes in for an ulcer on his left leg.  He has had multiple issues in the past with stasis dermatitis, chronic edema changes and cellulitis of his legs.  This 1 has been going on for about a week.  He denies any fever or 3 Adderall to see normal chills.  The right leg does not have any weeping at this time.  Past Medical History:  Diagnosis Date  . Abnormal CXR (chest x-ray)   . Agoraphobia   . Bipolar disorder ([place])   . Chronic low back pain   . Depression   . Osteopenia   . Peripheral neuropathy   . Vitamin D deficiency    Relevant past medical, surgical, family and social history reviewed and updated as indicated. Interim medical history since our last visit reviewed. Allergies and medications reviewed and updated. DATA REVIEWED: CHART IN EPIC  Family History reviewed for pertinent findings.  Review of Systems  Constitutional: Negative.  Negative for appetite change and fatigue.  HENT: Negative.   Eyes: Negative.  Negative for pain and visual disturbance.  Respiratory: Negative.  Negative for cough, chest tightness, shortness of breath and wheezing.   Cardiovascular: Negative.  Negative for chest pain, palpitations and leg swelling.  Gastrointestinal: Negative.  Negative for abdominal pain, diarrhea, nausea and vomiting.  Endocrine: Negative.   Genitourinary: Negative.   Musculoskeletal: Negative.   Skin: Negative.  Negative for color change and rash.  Neurological: Negative.  Negative for weakness, numbness and headaches.  Psychiatric/Behavioral: Negative.     Allergies as of 06/10/2017   No Known Allergies     Medication  List        Accurate as of 06/10/17 11:59 PM. Always use your most recent med list.          atorvastatin 40 MG tablet Commonly known as:  LIPITOR TAKE 1 TABLET (40 MG TOTAL) BY MOUTH DAILY AT 6 PM.   cephALEXin 500 MG capsule Commonly known as:  KEFLEX Take 1 capsule (500 mg total) by mouth 4 (four) times daily.   fluticasone 50 MCG/ACT nasal spray Commonly known as:  FLONASE Place 1 spray into both nostrils 2 (two) times daily as needed for allergies or rhinitis.   furosemide 20 MG tablet Commonly known as:  LASIX TAKE 2 TABLETS (40 MG TOTAL) BY MOUTH DAILY.   gabapentin 300 MG capsule Commonly known as:  NEURONTIN TAKE 2 CAPSULES (600 MG TOTAL) BY MOUTH 2 (TWO) TIMES DAILY.   Melatonin 5 MG Tabs Take 1 tablet (5 mg total) by mouth at bedtime.   metolazone 2.5 MG tablet Commonly known as:  ZAROXOLYN TAKE 1 TABLET BY MOUTH EVERY DAY   potassium chloride 10 MEQ tablet Commonly known as:  K-DUR Take 1 tablet three times daily   sertraline 50 MG tablet Commonly known as:  ZOLOFT Take 50 mg by mouth daily.   traZODone 150 MG tablet Commonly known as:  DESYREL Take 150 mg by mouth at bedtime.   triamcinolone cream 0.1 %  Commonly known as:  KENALOG Apply 1 application topically 2 (two) times daily.          Objective:    BP 136/72   Pulse 70   Temp (!) 96.8 F (36 C) (Oral)   Ht 5\' 11"  (1.803 m)   Wt 252 lb 6.4 oz (114.5 kg)   BMI 35.20 kg/m    No Known Allergies  Wt Readings from Last 3 Encounters:  06/10/17 250 lb (113.4 kg)  06/10/17 252 lb 6.4 oz (114.5 kg)  04/02/17 267 lb (121.1 kg)    Physical Exam  Constitutional: He appears well-developed and well-nourished. No distress.  HENT:  Head: Normocephalic and atraumatic.  Eyes: Pupils are equal, round, and reactive to light. Conjunctivae and EOM are normal.  Cardiovascular: Normal rate, regular rhythm and normal heart sounds.  Pulmonary/Chest: Effort normal and breath sounds normal. No  respiratory distress.  Skin: Skin is warm. Rash noted. Rash is not macular. There is erythema. No pallor.     Psychiatric: He has a normal mood and affect. His behavior is normal.  Nursing note and vitals reviewed.   Results for orders placed or performed during the hospital encounter of 06/10/17  CBC with Differential/Platelet  Result Value Ref Range   WBC 8.1 4.0 - 10.5 K/uL   RBC 4.41 4.22 - 5.81 MIL/uL   Hemoglobin 13.4 13.0 - 17.0 g/dL   HCT 40.7 39.0 - 52.0 %   MCV 92.3 78.0 - 100.0 fL   MCH 30.4 26.0 - 34.0 pg   MCHC 32.9 30.0 - 36.0 g/dL   RDW 16.9 (H) 11.5 - 15.5 %   Platelets 154 150 - 400 K/uL   Neutrophils Relative % 62 %   Neutro Abs 5.1 1.7 - 7.7 K/uL   Lymphocytes Relative 22 %   Lymphs Abs 1.8 0.7 - 4.0 K/uL   Monocytes Relative 13 %   Monocytes Absolute 1.1 (H) 0.1 - 1.0 K/uL   Eosinophils Relative 3 %   Eosinophils Absolute 0.2 0.0 - 0.7 K/uL   Basophils Relative 0 %   Basophils Absolute 0.0 0.0 - 0.1 K/uL  Basic metabolic panel  Result Value Ref Range   Sodium 137 135 - 145 mmol/L   Potassium 3.1 (L) 3.5 - 5.1 mmol/L   Chloride 94 (L) 101 - 111 mmol/L   CO2 34 (H) 22 - 32 mmol/L   Glucose, Bld 84 65 - 99 mg/dL   BUN 15 6 - 20 mg/dL   Creatinine, Ser 0.91 0.61 - 1.24 mg/dL   Calcium 8.6 (L) 8.9 - 10.3 mg/dL   GFR calc non Af Amer >60 >60 mL/min   GFR calc Af Amer >60 >60 mL/min   Anion gap 9 5 - 15      Assessment & Plan:   1. Peripheral edema Continue routine medical care Consider a pair of compression stockings that have less compression strength to use during these times a flareup  2. Cellulitis of left lower extremity Unna boot applied - cephALEXin (KEFLEX) 500 MG capsule; Take 1 capsule (500 mg total) by mouth 4 (four) times daily.  Dispense: 30 capsule; Refill: 0   Continue all other maintenance medications as listed above.  Follow up plan: Return in about 1 week (around 06/17/2017) for [place].  Educational handout given for  [place] PA-C [place] [address]  [place], [place] [address] [phone]   06/11/2017, 9:09 AM

## 2017-06-13 ENCOUNTER — Ambulatory Visit (HOSPITAL_COMMUNITY): Payer: Medicare Other | Admitting: Anesthesiology

## 2017-06-13 ENCOUNTER — Ambulatory Visit (HOSPITAL_COMMUNITY)
Admission: RE | Admit: 2017-06-13 | Discharge: 2017-06-13 | Disposition: A | Discharge status: Home / Self Care | Payer: Medicare Other | Source: Ambulatory Visit | Attending: Ophthalmology | Admitting: Ophthalmology

## 2017-06-13 ENCOUNTER — Encounter (HOSPITAL_COMMUNITY): Payer: Self-pay | Admitting: *Deleted

## 2017-06-13 ENCOUNTER — Encounter (HOSPITAL_COMMUNITY)
Admission: RE | Disposition: A | Discharge status: Home / Self Care | Payer: Self-pay | Source: Ambulatory Visit | Attending: Ophthalmology

## 2017-06-13 DIAGNOSIS — I10 Essential (primary) hypertension: Secondary | ICD-10-CM | POA: Insufficient documentation

## 2017-06-13 DIAGNOSIS — Z79899 Other long term (current) drug therapy: Secondary | ICD-10-CM | POA: Insufficient documentation

## 2017-06-13 DIAGNOSIS — H25812 Combined forms of age-related cataract, left eye: Secondary | ICD-10-CM | POA: Diagnosis not present

## 2017-06-13 DIAGNOSIS — F419 Anxiety disorder, unspecified: Secondary | ICD-10-CM | POA: Diagnosis not present

## 2017-06-13 DIAGNOSIS — E7800 Pure hypercholesterolemia, unspecified: Secondary | ICD-10-CM | POA: Insufficient documentation

## 2017-06-13 DIAGNOSIS — F329 Major depressive disorder, single episode, unspecified: Secondary | ICD-10-CM | POA: Insufficient documentation

## 2017-06-13 HISTORY — PX: CATARACT EXTRACTION W/PHACO: SHX586

## 2017-06-13 SURGERY — PHACOEMULSIFICATION, CATARACT, WITH IOL INSERTION
Anesthesia: Monitor Anesthesia Care | Site: Eye | Laterality: Left

## 2017-06-13 MED ORDER — EPINEPHRINE PF 1 MG/ML IJ SOLN
INTRAOCULAR | Status: DC | PRN
  Administered 2017-06-13: 500 mL

## 2017-06-13 MED ORDER — TETRACAINE HCL 0.5 % OP SOLN
1.0000 [drp] | OPHTHALMIC | Status: AC
  Administered 2017-06-13 (×3): 1 [drp] via OPHTHALMIC

## 2017-06-13 MED ORDER — NEOMYCIN-POLYMYXIN-DEXAMETH 3.5-10000-0.1 OP SUSP
OPHTHALMIC | Status: DC | PRN
  Administered 2017-06-13: 2 [drp] via OPHTHALMIC

## 2017-06-13 MED ORDER — LIDOCAINE HCL 3.5 % OP GEL
1.0000 "application " | Freq: Once | OPHTHALMIC | Status: AC
  Administered 2017-06-13: 1 via OPHTHALMIC

## 2017-06-13 MED ORDER — LIDOCAINE HCL (PF) 1 % IJ SOLN
INTRAOCULAR | Status: DC | PRN
  Administered 2017-06-13: .8 mL via OPHTHALMIC

## 2017-06-13 MED ORDER — CYCLOPENTOLATE-PHENYLEPHRINE 0.2-1 % OP SOLN
1.0000 [drp] | OPHTHALMIC | Status: AC
  Administered 2017-06-13 (×3): 1 [drp] via OPHTHALMIC

## 2017-06-13 MED ORDER — BSS IO SOLN
INTRAOCULAR | Status: DC | PRN
  Administered 2017-06-13: 15 mL

## 2017-06-13 MED ORDER — MIDAZOLAM HCL 2 MG/2ML IJ SOLN
INTRAMUSCULAR | Status: AC
  Filled 2017-06-13: qty 2

## 2017-06-13 MED ORDER — MIDAZOLAM HCL 5 MG/5ML IJ SOLN
INTRAMUSCULAR | Status: DC | PRN
  Administered 2017-06-13: 1 mg via INTRAVENOUS

## 2017-06-13 MED ORDER — POVIDONE-IODINE 5 % OP SOLN
OPHTHALMIC | Status: DC | PRN
  Administered 2017-06-13: 1 via OPHTHALMIC

## 2017-06-13 MED ORDER — PROVISC 10 MG/ML IO SOLN
INTRAOCULAR | Status: DC | PRN
  Administered 2017-06-13: 0.85 mL via INTRAOCULAR

## 2017-06-13 MED ORDER — PHENYLEPHRINE HCL 2.5 % OP SOLN
1.0000 [drp] | OPHTHALMIC | Status: AC
  Administered 2017-06-13 (×3): 1 [drp] via OPHTHALMIC

## 2017-06-13 MED ORDER — SODIUM HYALURONATE 23 MG/ML IO SOLN
INTRAOCULAR | Status: DC | PRN
  Administered 2017-06-13: 0.6 mL via INTRAOCULAR

## 2017-06-13 MED ORDER — LACTATED RINGERS IV SOLN
INTRAVENOUS | Status: DC
  Administered 2017-06-13: 1000 mL via INTRAVENOUS

## 2017-06-13 SURGICAL SUPPLY — 13 items

## 2017-06-13 NOTE — Addendum Note (Signed)
Addendum  created 06/13/17 1326 by Vista Deck, CRNA   Order list changed

## 2017-06-13 NOTE — Discharge Instructions (Addendum)
Please discharge patient when stable, will follow up today with Dr. Wrzosek at the [facility] office immediately following discharge.  Leave shield in place until visit.  All paperwork with discharge instructions will be given at the office. ° ° °Monitored Anesthesia Care, Care After °These instructions provide you with information about caring for yourself after your procedure. Your health care provider may also give you more specific instructions. Your treatment has been planned according to current medical practices, but problems sometimes occur. Call your health care provider if you have any problems or questions after your procedure. °What can I expect after the procedure? °After your procedure, it is common to: °· Feel sleepy for several hours. °· Feel clumsy and have poor balance for several hours. °· Feel forgetful about what happened after the procedure. °· Have poor judgment for several hours. °· Feel nauseous or vomit. °· Have a sore throat if you had a breathing tube during the procedure. ° °Follow these instructions at home: °For at least 24 hours after the procedure: ° °· Do not: °? Participate in activities in which you could fall or become injured. °? Drive. °? Use heavy machinery. °? Drink alcohol. °? Take sleeping pills or medicines that cause drowsiness. °? Make important decisions or sign legal documents. °? Take care of children on your own. °· Rest. °Eating and drinking °· Follow the diet that is recommended by your health care provider. °· If you vomit, drink water, juice, or soup when you can drink without vomiting. °· Make sure you have little or no nausea before eating solid foods. °General instructions °· Have a responsible adult stay with you until you are awake and alert. °· Take over-the-counter and prescription medicines only as told by your health care provider. °· If you smoke, do not smoke without supervision. °· Keep all follow-up visits as told by your health care  provider. This is important. °Contact a health care provider if: °· You keep feeling nauseous or you keep vomiting. °· You feel light-headed. °· You develop a rash. °· You have a fever. °Get help right away if: °· You have trouble breathing. °This information is not intended to replace advice given to you by your health care provider. Make sure you discuss any questions you have with your health care provider. °Document Released: 04/16/2015 Document Revised: 08/16/2015 Document Reviewed: 04/16/2015 °Elsevier Interactive Patient Education © 2018 [employer] ° °

## 2017-06-13 NOTE — Anesthesia Preprocedure Evaluation (Addendum)
Anesthesia Evaluation  Patient identified by MRN, date of birth, ID band Patient awake    Reviewed: Allergy & Precautions, H&P , NPO status , Patient's Chart, lab work & pertinent test results  Airway Mallampati: II  TM Distance: >3 FB Neck ROM: full    Dental no notable dental hx. (+) Missing, Poor Dentition   Pulmonary neg pulmonary ROS, COPD, Current Smoker,     + wheezing      Cardiovascular Exercise Tolerance: Good negative cardio ROS   Rhythm:regular Rate:Normal     Neuro/Psych PSYCHIATRIC DISORDERS Anxiety Depression Bipolar Disorder  Neuromuscular disease negative neurological ROS  negative psych ROS   GI/Hepatic negative GI ROS, Neg liver ROS,   Endo/Other  negative endocrine ROS  Renal/GU negative Renal ROS  negative genitourinary   Musculoskeletal   Abdominal   Peds  Hematology negative hematology ROS (+)   Anesthesia Other Findings Nl EF per echo  Reproductive/Obstetrics negative OB ROS                            Anesthesia Physical Anesthesia Plan  ASA: III  Anesthesia Plan: MAC   Post-op Pain Management:    Induction:   PONV Risk Score and Plan:   Airway Management Planned:   Additional Equipment:   Intra-op Plan:   Post-operative Plan:   Informed Consent: I have reviewed the patients History and Physical, chart, labs and discussed the procedure including the risks, benefits and alternatives for the proposed anesthesia with the patient or authorized representative who has indicated his/her understanding and acceptance.   Dental Advisory Given  Plan Discussed with: CRNA  Anesthesia Plan Comments:         Anesthesia Quick Evaluation

## 2017-06-13 NOTE — Transfer of Care (Signed)
Immediate Anesthesia Transfer of Care Note  Patient: Kent Bryant  Procedure(s) Performed: CATARACT EXTRACTION PHACO AND INTRAOCULAR LENS PLACEMENT LEFT EYE (Left Eye)  Patient Location: PACU  Anesthesia Type:MAC  Level of Consciousness: awake and alert   Airway & Oxygen Therapy: Patient Spontanous Breathing  Post-op Assessment: Report given to RN  Post vital signs: Reviewed  Last Vitals:  Vitals Value Taken Time  BP    Temp    Pulse    Resp    SpO2      Last Pain:  Vitals:   06/13/17 1058  TempSrc: Oral  PainSc: 3       Patients Stated Pain Goal: 5 (73/41/93 7902)  Complications: No apparent anesthesia complications

## 2017-06-13 NOTE — Anesthesia Postprocedure Evaluation (Signed)
Anesthesia Post Note  Patient: Kent Bryant  Procedure(s) Performed: CATARACT EXTRACTION PHACO AND INTRAOCULAR LENS PLACEMENT LEFT EYE (Left Eye)  Patient location during evaluation: Short Stay Anesthesia Type: MAC Level of consciousness: awake and alert and oriented Pain management: pain level controlled Vital Signs Assessment: post-procedure vital signs reviewed and stable Respiratory status: spontaneous breathing Cardiovascular status: blood pressure returned to baseline and stable Postop Assessment: no apparent nausea or vomiting and adequate PO intake Anesthetic complications: no     Last Vitals:  Vitals:   06/13/17 1130 06/13/17 1135  BP:    Pulse:    Resp: (!) 26 19  Temp:    SpO2: 97% 98%    Last Pain:  Vitals:   06/13/17 1058  TempSrc: Oral  PainSc: 3                  Anelis Hrivnak

## 2017-06-13 NOTE — Op Note (Signed)
Date of procedure: 06/13/17  Pre-operative diagnosis: Visually significant cataract, Left Eye (H25.812)  Post-operative diagnosis: Visually significant cataract, Left Eye  Procedure: Removal of cataract via phacoemulsification and insertion of intra-ocular lens [name]  [name] into the capsular bag of the Left Eye  Attending surgeon: Gerda Diss. Odean Fester, MD, MA  Anesthesia: MAC, Topical [place]  Complications: None  Estimated Blood Loss: <59m (minimal)  Specimens: None  Implants: As above  Indications:  Visually significant cataract, Left Eye  Procedure:  The patient was seen and identified in the pre-operative area. The operative eye was identified and dilated.  The operative eye was marked.  Topical anesthesia was administered to the operative eye.     The patient was then to the operative suite and placed in the supine position.  A timeout was performed confirming the patient, procedure to be performed, and all other relevant information.   The patient's face was prepped and draped in the usual fashion for intra-ocular surgery.  A lid speculum was placed into the operative eye and the surgical microscope moved into place and focused.  An inferotemporal paracentesis was created using a 20 gauge paracentesis blade.  Shugarcaine was injected into the anterior chamber.  Viscoelastic was injected into the anterior chamber.  A temporal clear-corneal main wound incision was created using a 2.478mmicrokeratome.  A continuous curvilinear capsulorrhexis was initiated using an irrigating cystitome and completed using capsulorrhexis forceps.  Hydrodissection and hydrodeliniation were performed.  Viscoelastic was injected into the anterior chamber.  A phacoemulsification handpiece and a chopper as a second instrument were used to remove the nucleus and epinucleus. The irrigation/aspiration handpiece was used to remove any remaining cortical material.   The capsular bag was  reinflated with viscoelastic, checked, and found to be intact.  The intraocular lens was inserted into the capsular bag and dialed into place using a Kuglen hook.  The irrigation/aspiration handpiece was used to remove any remaining viscoelastic.  The clear corneal wound and paracentesis wounds were then hydrated and checked with Weck-Cels to be watertight.  The lid-speculum and drape was removed, and the patient's face was cleaned with a wet and dry 4x4.  Maxitrol was instilled in the eye before a clear shield was taped over the eye. The patient was taken to the post-operative care unit in good condition, having tolerated the procedure well.  Post-Op Instructions: [facility] and will receive all other orders and instructions.

## 2017-06-13 NOTE — H&P (Signed)
The H and P was reviewed and updated. The patient was examined.  No changes were found after exam.  The surgical eye was marked.  

## 2017-06-14 ENCOUNTER — Other Ambulatory Visit: Payer: Self-pay | Admitting: Family Medicine

## 2017-06-14 DIAGNOSIS — G609 Hereditary and idiopathic neuropathy, unspecified: Secondary | ICD-10-CM

## 2017-06-16 ENCOUNTER — Encounter (HOSPITAL_COMMUNITY): Payer: Self-pay | Admitting: Ophthalmology

## 2017-06-18 ENCOUNTER — Encounter: Payer: Self-pay | Admitting: Family Medicine

## 2017-06-18 ENCOUNTER — Ambulatory Visit: Payer: Medicare Other | Admitting: Family Medicine

## 2017-06-18 VITALS — BP 123/69 | HR 74 | Temp 97.9°F | Ht 71.0 in | Wt 236.0 lb

## 2017-06-18 DIAGNOSIS — R609 Edema, unspecified: Secondary | ICD-10-CM

## 2017-06-18 DIAGNOSIS — K7689 Other specified diseases of liver: Secondary | ICD-10-CM | POA: Diagnosis not present

## 2017-06-18 DIAGNOSIS — L03116 Cellulitis of left lower limb: Secondary | ICD-10-CM

## 2017-06-18 DIAGNOSIS — K769 Liver disease, unspecified: Secondary | ICD-10-CM

## 2017-06-18 DIAGNOSIS — R945 Abnormal results of liver function studies: Secondary | ICD-10-CM

## 2017-06-18 DIAGNOSIS — R7989 Other specified abnormal findings of blood chemistry: Secondary | ICD-10-CM

## 2017-06-18 DIAGNOSIS — L03119 Cellulitis of unspecified part of limb: Secondary | ICD-10-CM

## 2017-06-18 MED ORDER — CEPHALEXIN 500 MG PO CAPS
500.0000 mg | ORAL_CAPSULE | Freq: Four times a day (QID) | ORAL | 0 refills | Status: DC
Start: 2017-06-18 — End: 2017-07-25

## 2017-06-18 NOTE — Progress Notes (Signed)
BP 123/69   Pulse 74   Temp 97.9 F (36.6 C) (Oral)   Ht 5\' 11"  (1.803 m)   Wt 236 lb (107 kg)   BMI 32.92 kg/m    Subjective:    Patient ID: Kent Bryant, male    DOB: 11-16-1950, 67 y.o.   MRN: [id number]  HPI: Kent Bryant is a 67 y.o. male presenting on 06/18/2017 for Follow up cellulitis left leg   HPI Bilateral lower extremity swelling Patient comes in for 1 week history of increased swelling and erythema and warmth in his lower legs.  The right leg is worse than the left.  He has had drainage and redness and warmth that has been increasing.  He denies any fevers or chills.  The swelling of his legs has been something that has been problematic for quite some time but is worse with the redness and warmth.  He has had infections in his legs similar to this previously.  He also has cracking and peeling and seeping out of his legs as well.  He has been trying to use some compression stockings just recently.  Elevated liver function and abnormal ultrasound Previously patient had both elevated liver function and an abnormal liver ultrasound showing a lesion and recommended a CT scan.  We had previously ordered the CT scan looks like the patient had some miscommunication and missed that appointment and we will get that rescheduled at this point.  Relevant past medical, surgical, family and social history reviewed and updated as indicated. Interim medical history since our last visit reviewed. Allergies and medications reviewed and updated.  Review of Systems  Constitutional: Negative for chills and fever.  Respiratory: Negative for shortness of breath and wheezing.   Cardiovascular: Positive for leg swelling. Negative for chest pain.  Musculoskeletal: Negative for back pain and gait problem.  Skin: Positive for color change and wound. Negative for rash.  All other systems reviewed and are negative.   Per HPI unless specifically indicated above   Allergies as of 06/18/2017   No  Known Allergies     Medication List        Accurate as of 06/18/17  1:27 PM. Always use your most recent med list.          atorvastatin 40 MG tablet Commonly known as:  LIPITOR TAKE 1 TABLET (40 MG TOTAL) BY MOUTH DAILY AT 6 PM.   cephALEXin 500 MG capsule Commonly known as:  KEFLEX Take 1 capsule (500 mg total) by mouth 4 (four) times daily.   fluticasone 50 MCG/ACT nasal spray Commonly known as:  FLONASE Place 1 spray into both nostrils 2 (two) times daily as needed for allergies or rhinitis.   furosemide 20 MG tablet Commonly known as:  LASIX TAKE 2 TABLETS (40 MG TOTAL) BY MOUTH DAILY.   gabapentin 300 MG capsule Commonly known as:  NEURONTIN TAKE 2 CAPSULES (600 MG TOTAL) BY MOUTH 2 (TWO) TIMES DAILY.   Melatonin 5 MG Tabs Take 1 tablet (5 mg total) by mouth at bedtime.   metolazone 2.5 MG tablet Commonly known as:  ZAROXOLYN TAKE 1 TABLET BY MOUTH EVERY DAY   potassium chloride 10 MEQ tablet Commonly known as:  K-DUR Take 1 tablet three times daily   sertraline 50 MG tablet Commonly known as:  ZOLOFT Take 50 mg by mouth daily.   traZODone 150 MG tablet Commonly known as:  DESYREL Take 150 mg by mouth at bedtime.   triamcinolone cream 0.1 % Commonly  known as:  KENALOG Apply 1 application topically 2 (two) times daily.          Objective:    BP 123/69   Pulse 74   Temp 97.9 F (36.6 C) (Oral)   Ht 5\' 11"  (1.803 m)   Wt 236 lb (107 kg)   BMI 32.92 kg/m   Wt Readings from Last 3 Encounters:  06/18/17 236 lb (107 kg)  06/10/17 250 lb (113.4 kg)  06/10/17 252 lb 6.4 oz (114.5 kg)    Physical Exam  Constitutional: He is oriented to person, place, and time. He appears well-developed and well-nourished. No distress.  Slight jaundice discoloration/ashen discoloration of skin, patient has chronic smoker so may be stained from that.  Eyes: Conjunctivae are normal. No scleral icterus.  Cardiovascular: Normal rate, regular rhythm, normal heart  sounds and intact distal pulses.  No murmur heard. Pulmonary/Chest: Effort normal and breath sounds normal. No respiratory distress. He has no wheezes.  Abdominal: Soft. Bowel sounds are normal. He exhibits distension. He exhibits no mass. There is no tenderness. There is no guarding.  Musculoskeletal: Normal range of motion. He exhibits edema (Edema and cracking and peeling and redness on both lower extremities below the knees down to the ankles.).  Neurological: He is alert and oriented to person, place, and time. Coordination normal.  Skin: Skin is warm and dry. No rash noted. He is not diaphoretic.  Psychiatric: He has a normal mood and affect. His behavior is normal.  Nursing note and vitals reviewed.       Assessment & Plan:   Problem List Items Addressed This Visit      Other   Peripheral edema - Primary   Relevant Orders   Ambulatory referral to Physical Therapy    Other Visit Diagnoses    Elevated liver function tests       Liver nodule       Liver disease       Relevant Orders   CT Abdomen Pelvis W Contrast   Cellulitis of lower extremity, unspecified laterality       Both legs, cracking and peeling and swelling and drainage and erythema   Relevant Medications   cephALEXin (KEFLEX) 500 MG capsule   Cellulitis of left lower extremity       Relevant Medications   cephALEXin (KEFLEX) 500 MG capsule      Nurse to apply Unna boot, return in 1 week for recheck.  Ultrasound showed liver lesion and he had elevated liver function, will send for CT scan, it looks like it got scheduled but there was a miscommunication.  Follow up plan: Return in about 1 week (around 06/25/2017), or if symptoms worsen or fail to improve, for Wound recheck.  Counseling provided for all of the vaccine components Orders Placed This Encounter  Procedures  . CT Abdomen Pelvis W Contrast    Caryl Pina, MD [facility] 06/18/2017, 1:27 PM

## 2017-06-20 NOTE — Pre-Procedure Instructions (Signed)
Patient has been admitted to the hospital in [facility] for treatment of cellulitis and febrile illness.  Advised sister that surgery would have to be done once he has fully recovered.  Verbalized understanding.  Will advise Dr Marisa Hua.

## 2017-06-23 ENCOUNTER — Encounter (HOSPITAL_COMMUNITY)
Admission: RE | Admit: 2017-06-23 | Discharge: 2017-06-23 | Disposition: A | Discharge status: Home / Self Care | Payer: Medicaid Other | Source: Ambulatory Visit | Attending: Ophthalmology | Admitting: Ophthalmology

## 2017-06-25 ENCOUNTER — Ambulatory Visit: Payer: Medicaid Other | Admitting: Family Medicine

## 2017-06-27 ENCOUNTER — Encounter (HOSPITAL_COMMUNITY): Admission: RE | Payer: Self-pay | Source: Ambulatory Visit

## 2017-06-27 ENCOUNTER — Ambulatory Visit (HOSPITAL_COMMUNITY): Admission: RE | Admit: 2017-06-27 | Payer: Medicaid Other | Source: Ambulatory Visit | Admitting: Ophthalmology

## 2017-06-27 SURGERY — PHACOEMULSIFICATION, CATARACT, WITH IOL INSERTION
Anesthesia: Monitor Anesthesia Care | Laterality: Right

## 2017-07-07 ENCOUNTER — Ambulatory Visit: Payer: Medicaid Other | Admitting: Family Medicine

## 2017-07-11 ENCOUNTER — Ambulatory Visit (HOSPITAL_COMMUNITY): Payer: Medicaid Other

## 2017-07-21 ENCOUNTER — Encounter (HOSPITAL_COMMUNITY): Payer: Self-pay

## 2017-07-21 ENCOUNTER — Encounter (HOSPITAL_COMMUNITY)
Admission: RE | Admit: 2017-07-21 | Discharge: 2017-07-21 | Disposition: A | Discharge status: Home / Self Care | Payer: Medicare Other | Source: Ambulatory Visit | Attending: Ophthalmology | Admitting: Ophthalmology

## 2017-07-22 ENCOUNTER — Other Ambulatory Visit: Payer: Self-pay

## 2017-07-22 ENCOUNTER — Encounter (HOSPITAL_COMMUNITY): Payer: Self-pay

## 2017-07-22 ENCOUNTER — Emergency Department (HOSPITAL_COMMUNITY): Payer: Medicare Other

## 2017-07-22 ENCOUNTER — Inpatient Hospital Stay (HOSPITAL_COMMUNITY)
Admission: EM | Admit: 2017-07-22 | Discharge: 2017-07-25 | DRG: 432 | Disposition: A | Discharge status: Other Acute Inpatient Hospital | Payer: Medicare Other | Attending: Internal Medicine | Admitting: Internal Medicine

## 2017-07-22 DIAGNOSIS — R0602 Shortness of breath: Secondary | ICD-10-CM

## 2017-07-22 DIAGNOSIS — R609 Edema, unspecified: Secondary | ICD-10-CM

## 2017-07-22 DIAGNOSIS — K7460 Unspecified cirrhosis of liver: Secondary | ICD-10-CM | POA: Diagnosis not present

## 2017-07-22 DIAGNOSIS — J9601 Acute respiratory failure with hypoxia: Secondary | ICD-10-CM | POA: Diagnosis present

## 2017-07-22 DIAGNOSIS — E876 Hypokalemia: Secondary | ICD-10-CM | POA: Diagnosis not present

## 2017-07-22 DIAGNOSIS — Z961 Presence of intraocular lens: Secondary | ICD-10-CM | POA: Diagnosis present

## 2017-07-22 DIAGNOSIS — G629 Polyneuropathy, unspecified: Secondary | ICD-10-CM | POA: Diagnosis present

## 2017-07-22 DIAGNOSIS — D62 Acute posthemorrhagic anemia: Secondary | ICD-10-CM | POA: Diagnosis present

## 2017-07-22 DIAGNOSIS — R601 Generalized edema: Secondary | ICD-10-CM | POA: Diagnosis not present

## 2017-07-22 DIAGNOSIS — M8580 Other specified disorders of bone density and structure, unspecified site: Secondary | ICD-10-CM | POA: Diagnosis present

## 2017-07-22 DIAGNOSIS — G4700 Insomnia, unspecified: Secondary | ICD-10-CM | POA: Diagnosis present

## 2017-07-22 DIAGNOSIS — R591 Generalized enlarged lymph nodes: Secondary | ICD-10-CM | POA: Diagnosis present

## 2017-07-22 DIAGNOSIS — J449 Chronic obstructive pulmonary disease, unspecified: Secondary | ICD-10-CM | POA: Diagnosis present

## 2017-07-22 DIAGNOSIS — F411 Generalized anxiety disorder: Secondary | ICD-10-CM | POA: Diagnosis present

## 2017-07-22 DIAGNOSIS — R188 Other ascites: Secondary | ICD-10-CM | POA: Diagnosis not present

## 2017-07-22 DIAGNOSIS — E8809 Other disorders of plasma-protein metabolism, not elsewhere classified: Secondary | ICD-10-CM | POA: Diagnosis present

## 2017-07-22 DIAGNOSIS — J90 Pleural effusion, not elsewhere classified: Secondary | ICD-10-CM | POA: Diagnosis present

## 2017-07-22 DIAGNOSIS — D68311 Acquired hemophilia: Secondary | ICD-10-CM | POA: Diagnosis present

## 2017-07-22 DIAGNOSIS — Z79899 Other long term (current) drug therapy: Secondary | ICD-10-CM

## 2017-07-22 DIAGNOSIS — F319 Bipolar disorder, unspecified: Secondary | ICD-10-CM | POA: Diagnosis not present

## 2017-07-22 DIAGNOSIS — F17210 Nicotine dependence, cigarettes, uncomplicated: Secondary | ICD-10-CM | POA: Diagnosis present

## 2017-07-22 DIAGNOSIS — D66 Hereditary factor VIII deficiency: Secondary | ICD-10-CM | POA: Diagnosis present

## 2017-07-22 DIAGNOSIS — F4000 Agoraphobia, unspecified: Secondary | ICD-10-CM | POA: Diagnosis present

## 2017-07-22 DIAGNOSIS — K429 Umbilical hernia without obstruction or gangrene: Secondary | ICD-10-CM | POA: Diagnosis present

## 2017-07-22 DIAGNOSIS — G8929 Other chronic pain: Secondary | ICD-10-CM | POA: Diagnosis present

## 2017-07-22 DIAGNOSIS — M545 Low back pain: Secondary | ICD-10-CM

## 2017-07-22 DIAGNOSIS — R0600 Dyspnea, unspecified: Secondary | ICD-10-CM

## 2017-07-22 DIAGNOSIS — Z9842 Cataract extraction status, left eye: Secondary | ICD-10-CM

## 2017-07-22 DIAGNOSIS — I311 Chronic constrictive pericarditis: Secondary | ICD-10-CM | POA: Diagnosis present

## 2017-07-22 HISTORY — DX: Chronic obstructive pulmonary disease, unspecified: J44.9

## 2017-07-22 HISTORY — DX: Hereditary factor VIII deficiency: D66

## 2017-07-22 HISTORY — DX: Coagulation defect, unspecified: D68.9

## 2017-07-22 HISTORY — DX: Edema, unspecified: R60.9

## 2017-07-22 HISTORY — DX: Localized edema: R60.0

## 2017-07-22 HISTORY — DX: Anxiety disorder, unspecified: F41.9

## 2017-07-22 HISTORY — DX: Insomnia, unspecified: G47.00

## 2017-07-22 LAB — CBC WITH DIFFERENTIAL/PLATELET
BASOS ABS: 0 10*3/uL (ref 0.0–0.1)
Basophils Relative: 1 %
EOS ABS: 0.2 10*3/uL (ref 0.0–0.7)
EOS PCT: 4 %
HCT: 36.9 % — ABNORMAL LOW (ref 39.0–52.0)
Hemoglobin: 11.6 g/dL — ABNORMAL LOW (ref 13.0–17.0)
LYMPHS ABS: 1.2 10*3/uL (ref 0.7–4.0)
LYMPHS PCT: 27 %
MCH: 31.8 pg (ref 26.0–34.0)
MCHC: 31.4 g/dL (ref 30.0–36.0)
MCV: 101.1 fL — AB (ref 78.0–100.0)
MONO ABS: 0.9 10*3/uL (ref 0.1–1.0)
Monocytes Relative: 20 %
Neutro Abs: 2.2 10*3/uL (ref 1.7–7.7)
Neutrophils Relative %: 48 %
Platelets: 189 10*3/uL (ref 150–400)
RBC: 3.65 MIL/uL — AB (ref 4.22–5.81)
RDW: 18.9 % — AB (ref 11.5–15.5)
WBC: 4.6 10*3/uL (ref 4.0–10.5)

## 2017-07-22 LAB — COMPREHENSIVE METABOLIC PANEL WITH GFR
ALT: 25 U/L (ref 0–44)
AST: 38 U/L (ref 15–41)
Albumin: 2.6 g/dL — ABNORMAL LOW (ref 3.5–5.0)
Alkaline Phosphatase: 172 U/L — ABNORMAL HIGH (ref 38–126)
Anion gap: 5 (ref 5–15)
BUN: 12 mg/dL (ref 8–23)
CO2: 35 mmol/L — ABNORMAL HIGH (ref 22–32)
Calcium: 8.2 mg/dL — ABNORMAL LOW (ref 8.9–10.3)
Chloride: 99 mmol/L (ref 98–111)
Creatinine, Ser: 0.85 mg/dL (ref 0.61–1.24)
GFR calc Af Amer: >60 mL/min
GFR calc non Af Amer: >60 mL/min
Glucose, Bld: 96 mg/dL (ref 70–99)
Potassium: 3.2 mmol/L — ABNORMAL LOW (ref 3.5–5.1)
Sodium: 139 mmol/L (ref 135–145)
Total Bilirubin: 2.5 mg/dL — ABNORMAL HIGH (ref 0.3–1.2)
Total Protein: 6.4 g/dL — ABNORMAL LOW (ref 6.5–8.1)

## 2017-07-22 LAB — BRAIN NATRIURETIC PEPTIDE: B Natriuretic Peptide: 326 pg/mL — ABNORMAL HIGH (ref 0.0–100.0)

## 2017-07-22 LAB — PROTIME-INR
INR: 1.16
PROTHROMBIN TIME: 14.7 s (ref 11.4–15.2)

## 2017-07-22 LAB — APTT: aPTT: 43 s — ABNORMAL HIGH (ref 24–36)

## 2017-07-22 MED ORDER — SODIUM CHLORIDE 0.9% FLUSH: 3.0000 mL | INTRAVENOUS | Status: DC | PRN

## 2017-07-22 MED ORDER — TRAZODONE HCL 50 MG PO TABS
150.0000 mg | ORAL_TABLET | Freq: Every day | ORAL | Status: DC
  Administered 2017-07-22 – 2017-07-24 (×3): 150 mg via ORAL
  Filled 2017-07-22 (×3): qty 3

## 2017-07-22 MED ORDER — ATORVASTATIN CALCIUM 40 MG PO TABS
40.0000 mg | ORAL_TABLET | Freq: Every day | ORAL | Status: DC
  Administered 2017-07-22 – 2017-07-24 (×3): 40 mg via ORAL
  Filled 2017-07-22 (×3): qty 1

## 2017-07-22 MED ORDER — SERTRALINE HCL 50 MG PO TABS
50.0000 mg | ORAL_TABLET | Freq: Every day | ORAL | Status: DC
  Administered 2017-07-23 – 2017-07-24 (×2): 50 mg via ORAL
  Filled 2017-07-22 (×2): qty 1

## 2017-07-22 MED ORDER — ORAL CARE MOUTH RINSE
15.0000 mL | Freq: Two times a day (BID) | OROMUCOSAL | Status: DC
  Administered 2017-07-22 – 2017-07-25 (×5): 15 mL via OROMUCOSAL

## 2017-07-22 MED ORDER — SODIUM CHLORIDE 0.9% FLUSH
3.0000 mL | Freq: Two times a day (BID) | INTRAVENOUS | Status: DC
  Administered 2017-07-22 – 2017-07-24 (×5): 3 mL via INTRAVENOUS

## 2017-07-22 MED ORDER — VITAMIN D 1000 UNITS PO TABS
2000.0000 [IU] | ORAL_TABLET | Freq: Every day | ORAL | Status: DC
  Administered 2017-07-23 – 2017-07-24 (×2): 2000 [IU] via ORAL
  Filled 2017-07-22 (×4): qty 2

## 2017-07-22 MED ORDER — FLUTICASONE PROPIONATE 50 MCG/ACT NA SUSP: 1.0000 | Freq: Two times a day (BID) | NASAL | Status: DC | PRN

## 2017-07-22 MED ORDER — FERROUS SULFATE 325 (65 FE) MG PO TABS
325.0000 mg | ORAL_TABLET | Freq: Two times a day (BID) | ORAL | Status: DC
  Administered 2017-07-23 – 2017-07-24 (×4): 325 mg via ORAL
  Filled 2017-07-22 (×4): qty 1

## 2017-07-22 MED ORDER — SODIUM CHLORIDE 0.9 % IV SOLN: 250.0000 mL | INTRAVENOUS | Status: DC | PRN

## 2017-07-22 MED ORDER — PREDNISOLONE ACETATE 1 % OP SUSP
1.0000 [drp] | Freq: Four times a day (QID) | OPHTHALMIC | Status: DC
  Administered 2017-07-22 – 2017-07-24 (×9): 1 [drp] via OPHTHALMIC
  Filled 2017-07-22: qty 5
  Filled 2017-07-22: qty 1

## 2017-07-22 MED ORDER — MELATONIN 3 MG PO TABS
3.0000 mg | ORAL_TABLET | Freq: Every day | ORAL | Status: DC
  Administered 2017-07-22 – 2017-07-24 (×3): 3 mg via ORAL
  Filled 2017-07-22 (×3): qty 1

## 2017-07-22 MED ORDER — MELATONIN 5 MG PO TABS
1.0000 | ORAL_TABLET | Freq: Every day | ORAL | Status: DC
  Filled 2017-07-22: qty 1

## 2017-07-22 MED ORDER — POLYETHYLENE GLYCOL 3350 17 G PO PACK
17.0000 g | PACK | Freq: Every day | ORAL | Status: DC
Start: 2017-07-22 — End: 2017-07-25
  Administered 2017-07-22 – 2017-07-23 (×2): 17 g via ORAL
  Filled 2017-07-22 (×3): qty 1

## 2017-07-22 MED ORDER — GABAPENTIN 300 MG PO CAPS
600.0000 mg | ORAL_CAPSULE | Freq: Two times a day (BID) | ORAL | Status: DC
  Administered 2017-07-22 – 2017-07-24 (×5): 600 mg via ORAL
  Filled 2017-07-22 (×5): qty 2

## 2017-07-22 NOTE — H&P (Signed)
History and Physical    Kent Bryant OJJ:[id number] DOB: [birth year] DOA: 07/22/2017  PCP: Dettinger, Fransisca Kaufmann, MD  Patient coming from rehab  Chief Complaint: Swelling  HPI: Kent Bryant is a 67 y.o. 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  Patient has had and at ultrasound as an outpatient which showed over 2 L of ascites.  He has been told he has problems with his liver but does not know why.  He has no long-standing history of alcohol abuse in the past.  He has not had a full work-up of his liver problem.  He he was sent here for a paracentesis only and was found to have mild hypoxia at 89% on room air.  Patient does not have requirements of oxygen supplementation at the rehab center.  He has been diffusely swelling for over 2 weeks now.  Dr. Alvino Chapel in the ED called [facility] about his recently diagnosed hemophilia who report that this is resolved and he is able to proceed with any procedures at this time.  Patient is being referred for admission for the need for therapeutic and diagnostic paracentesis.  Not been evaluated by GI specialist.  Review of Systems: As per HPI otherwise 10 point review of systems negative.   Past Medical History:  Diagnosis Date  . Abnormal CXR (chest x-ray)   . Agoraphobia   . Anxiety   . Bipolar disorder ([place])   . Chronic low back pain   . Coagulopathy ([place])   . COPD (chronic obstructive pulmonary disease) ([place])   . Depression   . Depression   . Hemophilia ([place])   . Insomnia   . Osteopenia   . Peripheral edema   . Peripheral neuropathy   . Peripheral neuropathy   . Vitamin D deficiency     Past Surgical History:  Procedure Laterality Date  . CATARACT EXTRACTION  05/29/2017  . CATARACT EXTRACTION W/PHACO Left 06/13/2017   Procedure: CATARACT EXTRACTION PHACO AND INTRAOCULAR  LENS PLACEMENT LEFT EYE;  Surgeon: Baruch Goldmann, MD;  Location: [facility];  Service: Ophthalmology;  Laterality: Left;  CDE: 23.32     reports that he has been smoking cigarettes.  He has a 7.50 pack-year smoking history. He has never used smokeless tobacco. He reports that he does not drink alcohol or use drugs.  No Known Allergies  Family History  Problem Relation Age of Onset  . Stroke Mother     Prior to Admission medications   Medication Sig Start Date End Date Taking? Authorizing Provider  atorvastatin (LIPITOR) 40 MG tablet TAKE 1 TABLET (40 MG TOTAL) BY MOUTH DAILY AT 6 PM. 04/04/17  Yes Dettinger, Fransisca Kaufmann, MD  cholecalciferol (VITAMIN D) 1000 units tablet Take 2,000 Units by mouth daily.   Yes [provider]  ferrous sulfate 325 (65 FE) MG tablet Take 325 mg by mouth 2 (two) times daily with a meal.   Yes [provider]  fluticasone (FLONASE) 50 MCG/ACT nasal spray Place 1 spray into both nostrils 2 (two) times daily as needed for allergies or rhinitis. 08/28/16  Yes Dettinger, Fransisca Kaufmann, MD  furosemide (LASIX) 20 MG tablet TAKE 2 TABLETS (40 MG TOTAL) BY MOUTH DAILY. 06/11/16  Yes Dettinger, Fransisca Kaufmann, MD  gabapentin (NEURONTIN) 300 MG capsule TAKE 2 CAPSULES (600 MG TOTAL) BY MOUTH 2 (TWO) TIMES DAILY. 06/16/17  Yes Dettinger, Fransisca Kaufmann, MD  Melatonin 5 MG TABS Take 1 tablet (5 mg total) by mouth at bedtime. 02/28/17  Yes Dettinger, Fransisca Kaufmann, MD  polyethylene glycol (MIRALAX / GLYCOLAX) packet Take 17 g by mouth daily.   Yes [provider]  prednisoLONE acetate (PRED FORTE) 1 % ophthalmic suspension Place 1 drop into the left eye 4 (four) times daily.   Yes [provider]  sertraline (ZOLOFT) 50 MG tablet Take 50 mg by mouth daily. 03/18/17  Yes [provider]  traZODone (DESYREL) 150 MG tablet Take 150 mg by mouth at bedtime. 03/18/17  Yes [provider]  cephALEXin (KEFLEX) 500 MG capsule Take 1 capsule (500 mg total) by mouth 4  (four) times daily. Patient not taking: Reported on 07/22/2017 06/18/17   Dettinger, Fransisca Kaufmann, MD  metolazone (ZAROXOLYN) 2.5 MG tablet TAKE 1 TABLET BY MOUTH EVERY DAY Patient not taking: Reported on 07/22/2017 05/07/17   Dettinger, Fransisca Kaufmann, MD  potassium chloride (K-DUR) 10 MEQ tablet Take 1 tablet three times daily Patient not taking: Reported on 07/22/2017 04/04/17   Dettinger, Fransisca Kaufmann, MD    Physical Exam: Vitals:   07/22/17 1717 07/22/17 1753 07/22/17 1910 07/22/17 2000  BP:   (!) 117/55 (!) 112/58  Pulse:   70 71  Resp:   20 18  Temp:      TempSrc:      SpO2: 94% 97% 99% 98%  Weight:      Height:          Constitutional: NAD, calm, comfortable Vitals:   07/22/17 1717 07/22/17 1753 07/22/17 1910 07/22/17 2000  BP:   (!) 117/55 (!) 112/58  Pulse:   70 71  Resp:   20 18  Temp:      TempSrc:      SpO2: 94% 97% 99% 98%  Weight:      Height:       Eyes: PERRL, lids and conjunctivae normal ENMT: Mucous membranes are moist. Posterior pharynx clear of any exudate or lesions.Normal dentition.  Neck: normal, supple, no masses, no thyromegaly Respiratory: clear to auscultation bilaterally, no wheezing, no crackles. Normal respiratory effort. No accessory muscle use.  Cardiovascular: Regular rate and rhythm, no murmurs / rubs / gallops.  3+ extremity edema. 2+ pedal pulses. No carotid bruits.  Abdomen: no tenderness, no masses palpated. No hepatosplenomegaly. Bowel sounds positive.  Moderate ascites Musculoskeletal: no clubbing / cyanosis. No joint deformity upper and lower extremities. Good ROM, no contractures. Normal muscle tone.  Skin: no rashes, lesions, ulcers. No induration Neurologic: CN 2-12 grossly intact. Sensation intact, DTR normal. Strength 5/5 in all 4.  Psychiatric: Normal judgment and insight. Alert and oriented x 3. Normal mood.    Labs on Admission: I have personally reviewed following labs and imaging studies  CBC: Recent Labs  Lab 07/22/17 1734  WBC 4.6    NEUTROABS 2.2  HGB 11.6*  HCT 36.9*  MCV 101.1*  PLT 532   Basic Metabolic Panel: Recent Labs  Lab 07/22/17 1734  NA 139  K 3.2*  CL 99  CO2 35*  GLUCOSE 96  BUN 12  CREATININE 0.85  CALCIUM 8.2*   GFR: Estimated Creatinine Clearance: 113.1 mL/min (by C-G formula based on SCr of 0.85 mg/dL). Liver Function Tests: Recent Labs  Lab 07/22/17 1734  AST 38  ALT 25  ALKPHOS 172*  BILITOT 2.5*  PROT 6.4*  ALBUMIN 2.6*   No results for input(s): LIPASE, AMYLASE in the last 168  hours. No results for input(s): AMMONIA in the last 168 hours. Coagulation Profile: Recent Labs  Lab 07/22/17 1734  INR 1.16   Cardiac Enzymes: No results for input(s): CKTOTAL, CKMB, CKMBINDEX, TROPONINI in the last 168 hours. BNP (last 3 results) No results for input(s): PROBNP in the last 8760 hours. HbA1C: No results for input(s): HGBA1C in the last 72 hours. CBG: No results for input(s): GLUCAP in the last 168 hours. Lipid Profile: No results for input(s): CHOL, HDL, LDLCALC, TRIG, CHOLHDL, LDLDIRECT in the last 72 hours. Thyroid Function Tests: No results for input(s): TSH, T4TOTAL, FREET4, T3FREE, THYROIDAB in the last 72 hours. Anemia Panel: No results for input(s): VITAMINB12, FOLATE, FERRITIN, TIBC, IRON, RETICCTPCT in the last 72 hours. Urine analysis: No results found for: COLORURINE, APPEARANCEUR, LABSPEC, PHURINE, GLUCOSEU, HGBUR, BILIRUBINUR, KETONESUR, PROTEINUR, UROBILINOGEN, NITRITE, LEUKOCYTESUR Sepsis Labs: !!!!!!!!!!!!!!!!!!!!!!!!!!!!!!!!!!!!!!!!!!!! @LABRCNTIP (procalcitonin:4,lacticidven:4) )No results found for this or any previous visit (from the past 240 hour(s)).   Radiological Exams on Admission: Dg Chest 2 View  Result Date: 07/22/2017 CLINICAL DATA:  Shortness of breath and abdominal swelling. EXAM: CHEST - 2 VIEW COMPARISON:  05/27/2016 FINDINGS: Very low bilateral lung volumes. Small bilateral pleural effusions. Mild interstitial pulmonary prominence could  be consistent with a component of interstitial edema. No overt airspace edema, focal airspace consolidation or pneumothorax. The heart size is within normal limits. IMPRESSION: Low lung volumes, small bilateral pleural effusions and potential mild interstitial edema. Electronically Signed   By: Aletta Edouard M.D.   On: 07/22/2017 18:44    EKG: Independently reviewed.  Normal sinus rhythm no acute changes  Old chart reviewed  Case discussed with Dr. Alvino Chapel  Chest x-ray reviewed small pleural effusions No focal infiltrate  Records from [facility] reviewed that were sent with rehab center  Assessment/Plan 67 year old male with new ascites and oxygen requirements from swelling Principal Problem: New cirrhosis of liver with ascites (HCC)-order therapeutic and diagnostic paracentesis in the morning.  Respiratory status is stable.  Obtain GI consultation for what appears to be new onset liver failure.  Also obtain cardiac echo  Active Problems:   Acute respiratory failure with hypoxia (HCC)-mild and 89% on room air supplemental oxygen as needed    GAD (generalized anxiety disorder)-stable    Hemophilia ([place]) [facility] reported this was a resolved problem-acquired and treated at [facility] recently can proceed with paracentesis in the morning if needed    Bipolar disorder (HCC)-stable    Chronic low back pain-stable    COPD (chronic obstructive pulmonary disease) (HCC)-stable lungs clear at this time    DVT prophylaxis: SCDs Code Status: Full Family Communication: Wife Disposition Plan: 1 to 3 days Consults called: GI Admission status: Observation   Jaycey Gens A MD [facility]  If 7PM-7AM, please contact night-coverage [email or web address] Password [facility]  07/22/2017, 8:26 PM

## 2017-07-22 NOTE — ED Triage Notes (Signed)
Pt comes from [facility] facility.  Reports has had sob and swelling in abd x 2 weeks.  Staff says pt sent here for paracentesis.  Pt had ultrasound of abd recently.

## 2017-07-22 NOTE — ED Provider Notes (Signed)
[facility] EMERGENCY DEPARTMENT Provider Note   CSN: [id number] Arrival date & time: 07/22/17  1708     History   Chief Complaint Chief Complaint  Patient presents with  . Shortness of Breath  . abd swelling    HPI Kent Bryant is a 67 y.o. male.  HPI Patient sent from [facility] rehab for paracentesis.  History of peripheral edema and some cirrhosis.  Had recent ultrasound that reportedly shows large volume of ascites.  Patient had worsening shortness of breath.  Caring more edema.  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  Patient states he was supposed to follow-up with [facility] and has not recently been able to get there because of some transportation issues. Past Medical History:  Diagnosis Date  . Abnormal CXR (chest x-ray)   . Agoraphobia   . Anxiety   . Bipolar disorder ([place])   . Chronic low back pain   . Coagulopathy ([place])   . COPD (chronic obstructive pulmonary disease) ([place])   . Depression   . Depression   . 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 Insomnia   . Osteopenia   . Peripheral edema   . Peripheral neuropathy   . Peripheral neuropathy   . Vitamin D deficiency     Patient Active Problem List   Diagnosis Date Noted  . COPD with acute exacerbation ([facility]) 03/26/2017  . Peripheral edema 03/03/2014  . Insomnia 03/03/2014  . Hypokalemia 10/21/2013  . BMI 37.0-37.9, adult 06/28/2013  . Major depression in remission ([place]) 12/16/2012  . GAD (generalized anxiety disorder) 12/16/2012  . Peripheral neuropathy, idiopathic 12/16/2012  . Hyperlipidemia with target LDL less than 130 12/16/2012    Past Surgical History:  Procedure Laterality Date  . CATARACT EXTRACTION  05/29/2017  . CATARACT EXTRACTION W/PHACO Left 06/13/2017   Procedure: CATARACT EXTRACTION PHACO AND INTRAOCULAR LENS PLACEMENT LEFT EYE;  Surgeon: Baruch Goldmann, MD;  Location: [facility] ORS;  Service: Ophthalmology;  Laterality: Left;  CDE: 23.32         Home Medications    Prior to Admission medications   Medication Sig Start Date End Date Taking? Authorizing Provider  atorvastatin (LIPITOR) 40 MG tablet TAKE 1 TABLET (40 MG TOTAL) BY MOUTH DAILY AT 6 PM. 04/04/17  Yes Dettinger, Fransisca Kaufmann, MD  cholecalciferol (VITAMIN D) 1000 units tablet Take 2,000 Units by mouth daily.   Yes [provider]  ferrous sulfate 325 (65 FE) MG tablet Take 325 mg by mouth 2 (two) times daily with a meal.   Yes [provider]  fluticasone (FLONASE) 50 MCG/ACT nasal spray Place 1 spray into both nostrils 2 (two) times daily as needed for allergies or rhinitis. 08/28/16  Yes Dettinger, Fransisca Kaufmann, MD  furosemide (LASIX) 20 MG tablet TAKE 2 TABLETS (40 MG TOTAL) BY MOUTH DAILY. 06/11/16  Yes Dettinger, Fransisca Kaufmann, MD  gabapentin (NEURONTIN) 300 MG capsule TAKE 2 CAPSULES (600 MG TOTAL) BY MOUTH 2 (TWO) TIMES DAILY. 06/16/17  Yes Dettinger, Fransisca Kaufmann, MD  Melatonin 5 MG TABS Take 1 tablet (5 mg total) by mouth at bedtime. 02/28/17  Yes Dettinger, Fransisca Kaufmann, MD  polyethylene glycol (MIRALAX / GLYCOLAX) packet Take 17 g by mouth daily.   Yes [provider]  prednisoLONE acetate (PRED FORTE) 1 % ophthalmic suspension Place 1 drop into the left eye 4 (four) times daily.   Yes [provider]  sertraline (ZOLOFT) 50 MG tablet Take 50 mg by mouth daily.  03/18/17  Yes [provider]  traZODone (DESYREL) 150 MG tablet Take 150 mg by mouth at bedtime. 03/18/17  Yes [provider]  cephALEXin (KEFLEX) 500 MG capsule Take 1 capsule (500 mg total) by mouth 4 (four) times daily. Patient not taking: Reported on 07/22/2017 06/18/17   Dettinger, Fransisca Kaufmann, MD  metolazone (ZAROXOLYN) 2.5 MG tablet TAKE 1 TABLET BY MOUTH EVERY DAY Patient not taking: Reported on 07/22/2017 05/07/17   Dettinger, Fransisca Kaufmann, MD  potassium chloride (K-DUR) 10 MEQ tablet Take 1 tablet three times daily Patient not taking: Reported on 07/22/2017 04/04/17    Dettinger, Fransisca Kaufmann, MD    Family History Family History  Problem Relation Age of Onset  . Stroke Mother     Social History Social History   Tobacco Use  . Smoking status: Current Every Day Smoker    Packs/day: 0.50    Years: 15.00    Pack years: 7.50    Types: Cigarettes  . Smokeless tobacco: Never Used  Substance Use Topics  . Alcohol use: No  . Drug use: No     Allergies   Patient has no known allergies.   Review of Systems Review of Systems  Constitutional: Negative for appetite change and fever.  HENT: Negative for congestion.   Respiratory: Positive for shortness of breath.   Cardiovascular: Positive for leg swelling.  Gastrointestinal: Positive for abdominal distention. Negative for nausea and vomiting.  Genitourinary: Negative for flank pain.  Musculoskeletal: Negative for back pain.  Skin: Positive for wound.  Hematological: Bruises/bleeds easily.     Physical Exam Updated Vital Signs BP (!) 117/55   Pulse 70   Temp (!) 97.5 F (36.4 C) (Oral)   Resp 20   Ht 5\' 11"  (1.803 m)   Wt 117.9 kg (260 lb)   SpO2 99%   BMI 36.26 kg/m   Physical Exam  Constitutional: He appears well-developed.  HENT:  Head: Atraumatic.  Eyes: Pupils are equal, round, and reactive to light. EOM are normal.  Pulmonary/Chest: Effort normal.  Somewhat harsh breath sounds with rales at bilateral bases.  Abdominal: He exhibits distension and ascites.  Distention with anasarca on the abdominal wall and bilateral lower extremities.  Large area of ecchymosis progressing from left chest to lower abdomen from the left to the right.  Musculoskeletal:       Right lower leg: He exhibits edema.       Left lower leg: He exhibits edema.  Dressed chronic wound of right lower leg.  Neurological: He is alert.  Skin: Capillary refill takes less than 2 seconds.     ED Treatments / Results  Labs (all labs ordered are listed, but only abnormal results are displayed) Labs Reviewed   CBC WITH DIFFERENTIAL/PLATELET - Abnormal; Notable for the following components:      Result Value   RBC 3.65 (*)    Hemoglobin 11.6 (*)    HCT 36.9 (*)    MCV 101.1 (*)    RDW 18.9 (*)    All other components within normal limits  COMPREHENSIVE METABOLIC PANEL - Abnormal; Notable for the following components:   Potassium 3.2 (*)    CO2 35 (*)    Calcium 8.2 (*)    Total Protein 6.4 (*)    Albumin 2.6 (*)    Alkaline Phosphatase 172 (*)    Total Bilirubin 2.5 (*)    All other components within normal limits  APTT - Abnormal; Notable for the following components:  aPTT 43 (*)    All other components within normal limits  PROTIME-INR    EKG EKG Interpretation  Date/Time:  Tuesday July 22 2017 17:14:37 EDT Ventricular Rate:  94 PR Interval:    QRS Duration: 84 QT Interval:  407 QTC Calculation: 498 R Axis:   165 Text Interpretation:  Sinus rhythm Atrial premature complex Short PR interval Low voltage with right axis deviation Abnormal R-wave progression, late transition Nonspecific T abnormalities, lateral leads Borderline prolonged QT interval Confirmed by Davonna Belling [phone]) on 07/22/2017 5:19:52 PM   Radiology Dg Chest 2 View  Result Date: 07/22/2017 CLINICAL DATA:  Shortness of breath and abdominal swelling. EXAM: CHEST - 2 VIEW COMPARISON:  05/27/2016 FINDINGS: Very low bilateral lung volumes. Small bilateral pleural effusions. Mild interstitial pulmonary prominence could be consistent with a component of interstitial edema. No overt airspace edema, focal airspace consolidation or pneumothorax. The heart size is within normal limits. IMPRESSION: Low lung volumes, small bilateral pleural effusions and potential mild interstitial edema. Electronically Signed   By: Aletta Edouard M.D.   On: 07/22/2017 18:44    Procedures Procedures (including critical care time)  Medications Ordered in ED Medications - No data to display   Initial Impression / Assessment and  Plan / ED Course  I have reviewed the triage vital signs and the nursing notes.  Pertinent labs & imaging results that were available during my care of the patient were reviewed by me and considered in my medical decision making (see chart for details).     Patient with increased swelling shortness of breath over last couple weeks.  Sent in from rehab for paracentesis.  Has outpatient ultrasound done that shows at least 2 L of fluid in abdomen.  Has a mild oxygen requirement.  Swelling in his abdomen and scrotum.  Recent hemophilia diagnosis treated at [facility].  I discussed with the transfer team at [facility].  Dr. Sabra Heck from hematology states there is no reason to be on the hematology service.  Dr. Jamie Kato from the hospitalist service states that with patient being mostly cleared by hematology patient can stay here for further treatment.  Will admit to hospitalist.  Final Clinical Impressions(s) / ED Diagnoses   Final diagnoses:  Other ascites  Anasarca  Dyspnea, unspecified type    ED Discharge Orders    None       Davonna Belling, MD 07/22/17 2003

## 2017-07-23 ENCOUNTER — Observation Stay (HOSPITAL_COMMUNITY): Payer: Medicare Other

## 2017-07-23 ENCOUNTER — Encounter (HOSPITAL_COMMUNITY): Payer: Self-pay

## 2017-07-23 ENCOUNTER — Observation Stay (HOSPITAL_BASED_OUTPATIENT_CLINIC_OR_DEPARTMENT_OTHER): Payer: Medicare Other

## 2017-07-23 DIAGNOSIS — J9601 Acute respiratory failure with hypoxia: Secondary | ICD-10-CM | POA: Diagnosis not present

## 2017-07-23 DIAGNOSIS — R0600 Dyspnea, unspecified: Secondary | ICD-10-CM | POA: Diagnosis not present

## 2017-07-23 DIAGNOSIS — R188 Other ascites: Secondary | ICD-10-CM

## 2017-07-23 DIAGNOSIS — R601 Generalized edema: Secondary | ICD-10-CM | POA: Diagnosis not present

## 2017-07-23 DIAGNOSIS — K7460 Unspecified cirrhosis of liver: Secondary | ICD-10-CM | POA: Diagnosis not present

## 2017-07-23 LAB — CBC
HEMATOCRIT: 37.4 % — AB (ref 39.0–52.0)
HEMOGLOBIN: 11.6 g/dL — AB (ref 13.0–17.0)
MCH: 31.4 pg (ref 26.0–34.0)
MCHC: 31 g/dL (ref 30.0–36.0)
MCV: 101.4 fL — ABNORMAL HIGH (ref 78.0–100.0)
Platelets: 192 10*3/uL (ref 150–400)
RBC: 3.69 MIL/uL — AB (ref 4.22–5.81)
RDW: 18.8 % — ABNORMAL HIGH (ref 11.5–15.5)
WBC: 5.6 10*3/uL (ref 4.0–10.5)

## 2017-07-23 LAB — COMPREHENSIVE METABOLIC PANEL
ALBUMIN: 2.7 g/dL — AB (ref 3.5–5.0)
ALT: 25 U/L (ref 0–44)
ANION GAP: 8 (ref 5–15)
AST: 38 U/L (ref 15–41)
Alkaline Phosphatase: 164 U/L — ABNORMAL HIGH (ref 38–126)
BUN: 12 mg/dL (ref 8–23)
CO2: 33 mmol/L — AB (ref 22–32)
Calcium: 8.3 mg/dL — ABNORMAL LOW (ref 8.9–10.3)
Chloride: 98 mmol/L (ref 98–111)
Creatinine, Ser: 0.81 mg/dL (ref 0.61–1.24)
GFR calc non Af Amer: >60 mL/min (ref >60)
GLUCOSE: 78 mg/dL (ref 70–99)
Potassium: 3.1 mmol/L — ABNORMAL LOW (ref 3.5–5.1)
SODIUM: 139 mmol/L (ref 135–145)
Total Bilirubin: 2.7 mg/dL — ABNORMAL HIGH (ref 0.3–1.2)
Total Protein: 6.4 g/dL — ABNORMAL LOW (ref 6.5–8.1)

## 2017-07-23 LAB — MRSA PCR SCREENING: MRSA BY PCR: POSITIVE — AB

## 2017-07-23 LAB — IRON AND TIBC
Iron: 58 ug/dL (ref 45–182)
SATURATION RATIOS: 17 % — AB (ref 17.9–39.5)
TIBC: 340 ug/dL (ref 250–450)
UIBC: 282 ug/dL

## 2017-07-23 LAB — FERRITIN: Ferritin: 299 ng/mL (ref 24–336)

## 2017-07-23 LAB — SEDIMENTATION RATE: SED RATE: 31 mm/h — AB (ref 0–16)

## 2017-07-23 LAB — ECHOCARDIOGRAM COMPLETE
HEIGHTINCHES: 71 in
WEIGHTICAEL: 4497.6 [oz_av]

## 2017-07-23 MED ORDER — FUROSEMIDE 10 MG/ML IJ SOLN
80.0000 mg | Freq: Two times a day (BID) | INTRAMUSCULAR | Status: DC
  Administered 2017-07-23 – 2017-07-24 (×2): 80 mg via INTRAVENOUS
  Filled 2017-07-23 (×3): qty 8

## 2017-07-23 MED ORDER — CHLORHEXIDINE GLUCONATE CLOTH 2 % EX PADS
6.0000 | MEDICATED_PAD | Freq: Every day | CUTANEOUS | Status: DC
  Administered 2017-07-23 – 2017-07-24 (×2): 6 via TOPICAL

## 2017-07-23 MED ORDER — MUPIROCIN 2 % EX OINT
1.0000 "application " | TOPICAL_OINTMENT | Freq: Two times a day (BID) | CUTANEOUS | Status: DC
  Administered 2017-07-23 – 2017-07-24 (×4): 1 via NASAL
  Filled 2017-07-23 (×2): qty 22

## 2017-07-23 MED ORDER — POTASSIUM CHLORIDE CRYS ER 20 MEQ PO TBCR
40.0000 meq | EXTENDED_RELEASE_TABLET | Freq: Two times a day (BID) | ORAL | Status: DC
  Administered 2017-07-23 – 2017-07-24 (×3): 40 meq via ORAL
  Filled 2017-07-23 (×3): qty 2

## 2017-07-23 MED ORDER — ALBUMIN HUMAN 25 % IV SOLN
50.0000 g | Freq: Once | INTRAVENOUS | Status: AC
  Administered 2017-07-23: 50 g via INTRAVENOUS
  Filled 2017-07-23: qty 200

## 2017-07-23 MED ORDER — SPIRONOLACTONE 25 MG PO TABS
100.0000 mg | ORAL_TABLET | Freq: Every day | ORAL | Status: DC
  Administered 2017-07-23 – 2017-07-24 (×2): 100 mg via ORAL
  Filled 2017-07-23 (×2): qty 4

## 2017-07-23 NOTE — Care Management Obs Status (Signed)
[place] NOTIFICATION   Patient Details  Name: Kalif Kattner MRN: [id number] Date of Birth: 1950/02/04   Medicare Observation Status Notification Given:  Yes    Sherald Barge, RN 07/23/2017, 10:49 AM

## 2017-07-23 NOTE — Plan of Care (Signed)
Patient denies any anxiety at this time. Will continue to monitor.

## 2017-07-23 NOTE — Progress Notes (Signed)
*  PRELIMINARY RESULTS* Echocardiogram 2D Echocardiogram has been performed. This was my second attempt. Patient VERY SOB at bedside.  Samuel Germany 07/23/2017, 4:15 PM

## 2017-07-23 NOTE — Consult Note (Signed)
Reason for Consult: cirrhosis Referring Physician: Hospitalist  Kent Bryant is an 67 y.o. male.  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  Hx of acquired hemophilia and is followed by [facility].  Last seen at [facility] 07/17/2017 and received Rituximab 92m IV HPI:  Admitted thru the ED yesterday. Per records, sent to [facility] due to ascites.  (Patient is extremely SOB and can give hx).  No prior hx of etoh abuse. Sister states patient has had abdominal distention for several weeks. Per ED records recently had UKoreathat showed large volume of ascites. I do not see in system.  No prior work up for ascites.   07/17/2017 total bili 3.5, ALP 179, AST 39, ALT 23. ([facility]  Past Medical History:  Diagnosis Date  . Abnormal CXR (chest x-ray)   . Agoraphobia   . Anxiety   . Bipolar disorder ([place]   . Chronic low back pain   . Coagulopathy ([place]   . COPD (chronic obstructive pulmonary disease) ([place]   . Depression   . Depression   . Hemophilia ([place]   . Insomnia   . Osteopenia   . Peripheral edema   . Peripheral neuropathy   . Peripheral neuropathy   . Vitamin D deficiency     Past Surgical History:  Procedure Laterality Date  . CATARACT EXTRACTION  05/29/2017  . CATARACT EXTRACTION W/PHACO Left 06/13/2017   Procedure: CATARACT EXTRACTION PHACO AND INTRAOCULAR LENS PLACEMENT LEFT EYE;  Surgeon: WBaruch Goldmann MD;  Location: [facility];  Service: Ophthalmology;  Laterality: Left;  CDE: 23.32    Family History  Problem Relation Age of Onset  . Stroke Mother     Social History:  reports that he has been smoking cigarettes.  He has a 7.50 pack-year smoking history. He has never used smokeless tobacco. He reports that he does not drink alcohol or use drugs.  Allergies: No Known Allergies  Medications: I have reviewed the patient's current medications.  Results for orders placed or performed during the hospital encounter of 07/22/17 (from the past 48 hour(s))  CBC with Differential     Status:  Abnormal   Collection Time: 07/22/17  5:34 PM  Result Value Ref Range   WBC 4.6 4.0 - 10.5 K/uL   RBC 3.65 (L) 4.22 - 5.81 MIL/uL   Hemoglobin 11.6 (L) 13.0 - 17.0 g/dL   HCT 36.9 (L) 39.0 - 52.0 %   MCV 101.1 (H) 78.0 - 100.0 fL   MCH 31.8 26.0 - 34.0 pg   MCHC 31.4 30.0 - 36.0 g/dL   RDW 18.9 (H) 11.5 - 15.5 %   Platelets 189 150 - 400 K/uL   Neutrophils Relative % 48 %   Neutro Abs 2.2 1.7 - 7.7 K/uL   Lymphocytes Relative 27 %   Lymphs Abs 1.2 0.7 - 4.0 K/uL   Monocytes Relative 20 %   Monocytes Absolute 0.9 0.1 - 1.0 K/uL   Eosinophils Relative 4 %   Eosinophils Absolute 0.2 0.0 - 0.7 K/uL   Basophils Relative 1 %   Basophils Absolute 0.0 0.0 - 0.1 K/uL    Comment: [facility] [address] [place] [address] Comprehensive metabolic panel     Status: Abnormal   Collection Time: 07/22/17  5:34 PM  Result Value Ref Range   Sodium 139 135 - 145 mmol/L   Potassium 3.2 (L) 3.5 - 5.1 mmol/L   Chloride 99 98 - 111 mmol/L    Comment: Please note change  in reference range.   CO2 35 (H) 22 - 32 mmol/L   Glucose, Bld 96 70 - 99 mg/dL    Comment: Please note change in reference range.   BUN 12 8 - 23 mg/dL    Comment: Please note change in reference range.   Creatinine, Ser 0.85 0.61 - 1.24 mg/dL   Calcium 8.2 (L) 8.9 - 10.3 mg/dL   Total Protein 6.4 (L) 6.5 - 8.1 g/dL   Albumin 2.6 (L) 3.5 - 5.0 g/dL   AST 38 15 - 41 U/L   ALT 25 0 - 44 U/L    Comment: Please note change in reference range.   Alkaline Phosphatase 172 (H) 38 - 126 U/L   Total Bilirubin 2.5 (H) 0.3 - 1.2 mg/dL   GFR calc non Af Amer >60 >60 mL/min   GFR calc Af Amer >60 >60 mL/min    Comment: (NOTE) The eGFR has been calculated using the CKD EPI equation. This calculation has not been validated in all clinical situations. eGFR's persistently <60 mL/min signify possible Chronic Kidney Disease.    Anion gap 5 5 - 15    Comment: Performed at [facility], [address]  [place], [place] [address]  Protime-INR     Status: None   Collection Time: 07/22/17  5:34 PM  Result Value Ref Range   Prothrombin Time 14.7 11.4 - 15.2 seconds   INR 1.16     Comment: Performed at [place], [address] [place], [place] [address]  APTT     Status: Abnormal   Collection Time: 07/22/17  5:34 PM  Result Value Ref Range   aPTT 43 (H) 24 - 36 seconds    Comment:        IF BASELINE aPTT IS ELEVATED, SUGGEST PATIENT RISK ASSESSMENT BE USED TO DETERMINE APPROPRIATE ANTICOAGULANT THERAPY. Performed at [facility], [address], [place], [place] [address]   Brain natriuretic peptide     Status: Abnormal   Collection Time: 07/22/17  5:34 PM  Result Value Ref Range   B Natriuretic Peptide 326.0 (H) 0.0 - 100.0 pg/mL    Comment: Performed at [facility], [address] [place], [place] [address]  MRSA PCR Screening     Status: Abnormal   Collection Time: 07/23/17  1:56 AM  Result Value Ref Range   MRSA by PCR POSITIVE (A) NEGATIVE    Comment:        The GeneXpert MRSA Assay (FDA approved for NASAL specimens only), is one component of a comprehensive MRSA colonization surveillance program. It is not intended to diagnose MRSA infection nor to guide or monitor treatment for MRSA infections. RESULT CALLED TO, READ BACK BY AND VERIFIED WITH: JACKSON N. AT [id number] ON [id number] BY THOMPSON S. Performed at [facility], [address], [place], [place] [address]   Comprehensive metabolic panel     Status: Abnormal   Collection Time: 07/23/17  4:13 AM  Result Value Ref Range   Sodium 139 135 - 145 mmol/L   Potassium 3.1 (L) 3.5 - 5.1 mmol/L   Chloride 98 98 - 111 mmol/L    Comment: Please note change in reference range.   CO2 33 (H) 22 - 32 mmol/L   Glucose, Bld 78 70 - 99 mg/dL    Comment: Please note change in reference range.   BUN 12 8 - 23 mg/dL    Comment: Please note change in reference range.   Creatinine, Ser 0.81 0.61 - 1.24 mg/dL  Calcium 8.3 (L) 8.9 - 10.3  mg/dL   Total Protein 6.4 (L) 6.5 - 8.1 g/dL   Albumin 2.7 (L) 3.5 - 5.0 g/dL   AST 38 15 - 41 U/L   ALT 25 0 - 44 U/L    Comment: Please note change in reference range.   Alkaline Phosphatase 164 (H) 38 - 126 U/L   Total Bilirubin 2.7 (H) 0.3 - 1.2 mg/dL   GFR calc non Af Amer >60 >60 mL/min   GFR calc Af Amer >60 >60 mL/min    Comment: (NOTE) The eGFR has been calculated using the CKD EPI equation. This calculation has not been validated in all clinical situations. eGFR's persistently <60 mL/min signify possible Chronic Kidney Disease.    Anion gap 8 5 - 15    Comment: Performed at [facility], [address], [place], [place] [address]  CBC     Status: Abnormal   Collection Time: 07/23/17  4:13 AM  Result Value Ref Range   WBC 5.6 4.0 - 10.5 K/uL   RBC 3.69 (L) 4.22 - 5.81 MIL/uL   Hemoglobin 11.6 (L) 13.0 - 17.0 g/dL   HCT 37.4 (L) 39.0 - 52.0 %   MCV 101.4 (H) 78.0 - 100.0 fL   MCH 31.4 26.0 - 34.0 pg   MCHC 31.0 30.0 - 36.0 g/dL   RDW 18.8 (H) 11.5 - 15.5 %   Platelets 192 150 - 400 K/uL    Comment: Performed at [facility], [address] [place], [place] [address]    Dg Chest 2 View  Result Date: 07/22/2017 CLINICAL DATA:  Shortness of breath and abdominal swelling. EXAM: CHEST - 2 VIEW COMPARISON:  05/27/2016 FINDINGS: Very low bilateral lung volumes. Small bilateral pleural effusions. Mild interstitial pulmonary prominence could be consistent with a component of interstitial edema. No overt airspace edema, focal airspace consolidation or pneumothorax. The heart size is within normal limits. IMPRESSION: Low lung volumes, small bilateral pleural effusions and potential mild interstitial edema. Electronically Signed   By: Aletta Edouard M.D.   On: 07/22/2017 18:44    ROS Blood pressure 138/77, pulse 80, temperature (!) 97.4 F (36.3 C), temperature source Oral, resp. rate 18, height '5\' 11"'  (1.803 m), weight 281 lb 1.6 oz (127.5 kg), SpO2 93 %. Physical Exam Patient  is extremely SOB. Lungs are clear. HR regular. Abdomen is distended, tense. Large purple bruise across lower abdomen. Edema to penis and scrotum 4+ edema to lower extremities. Cellulitis to both lower extremities. Assessment/Plan: Ascites, Patient is in [place] now for paracentesis. Will get ceruloplasmin, alpha 1 antitrypsin, SMA, ANA, IGG, sedrate Iron,ferritin  Kent Bryant 07/23/2017, 11:40 AM

## 2017-07-23 NOTE — Progress Notes (Signed)
PROGRESS NOTE                                                                                                                                                                                                             Patient Demographics:    Kent Bryant, is a 67 y.o. male, DOB - 1950/03/26, JME:[id number]  Admit date - 07/22/2017   Admitting Physician Phillips Grout, MD  Outpatient Primary MD for the patient is Dettinger, Fransisca Kaufmann, MD  LOS - 0  Outpatient Specialists: [facility].  Chief Complaint  Patient presents with  . Shortness of Breath  . abd swelling       Brief Narrative  67 year old male with anxiety, bipolar disorder, COPD, depression, acquired hematobilia followed at [facility] and received rituximab recently, prior history of alcohol use presented to the ED with increasing leg swelling now progressed to having abdominal and scrotal swelling.  Patient reports having leg swellings for past several weeks which has now progressed further with associated anasarca and shortness of breath.  Patient currently resident of [facility] called rehab who was sent to the ED for paracentesis. Patient was placed on 40 mg Lasix daily by his PCP about 5 weeks ago for increasing leg swellings. In the ED vitals were stable.  Blood work showed hypokalemia, hemoglobin 11.6 with elevated MCV, total bilirubin of 2.5, alkaline phosphatase of 172 and normal transaminases.  Albumin of 2.6. Patient admitted for paracentesis and further management.      Subjective:   Patient seen after returning from paracentesis which was unsuccessful due to small volume ascites and thickened abdominal wall with subcutaneous/ abdominal wall edema   Assessment  & Plan :    Principal Problem:   Cirrhosis of liver with ascites ([place]) Anasarca Unclear if this is purely due to decompensated liver cirrhosis versus cardiac process.   GI consulted.  Labs ordered for iron, ferritin, ESR, alpha-1 antitrypsin, ceruloplasmin, ASMA, ANA and IgG. We will place him on IV Lasix 80 mg every 12 hours along with Aldactone.  Also ordered a dose of IV albumin.  Large volume paracentesis unsuccessful due to small volume ascites and mainly abdominal wall edema. 2D echo done 4 weeks back, did on normal systolic function with?  Constrictive physiology. Further recommendation per GI. Will discuss on imaging of choice to further  evaluate his liver/ abdomen. (Patient had CT of the chest abdomen and pelvis with and without contrast done on 6/19 at [facility] which showed several subcentimeter mesenteric, pelvic and retroperitoneal lymph nodes which were nonspecific.  He also had nonspecific irregularity of the liver surface along the left hepatic lobe.)  Active Problems:   Hypokalemia Replenish with scheduled potassium.    COPD (chronic obstructive pulmonary disease) ([place]) Acute respiratory failure with hypoxia Patient satting at 89% on room air and stable on supplemental O2.  Suspect this is due to anasarca.  Will monitor with diuresis.  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 Per the oncologist note appears to be in remission with last FVIII activity of 78%.  No further bleeding and normal PT plan is to follow his coags monthly and treat him as needed if his FVIII level appears to decline.   Lymphadenopathy -  PET imaging done at [facility] was not suggestive of an aggressive lymphoproliferative disorder,  Biopsy by FNA was non-diagnostic for lymphoma.    Bipolar disorder/GAD Stable.  Continue home meds        Code Status : Full code  Family Communication  : Sister at bedside  Disposition Plan  : Pending clinical improvement in hospital course  Barriers For Discharge : Active symptoms  Consults  : GI  Procedures   : Ultrasound paracentesis/2D echo  DVT Prophylaxis  : SCDs  Lab Results  Component Value Date   PLT 192 07/23/2017    Antibiotics  :    Anti-infectives (From admission, onward)   None        Objective:   Vitals:   07/22/17 2030 07/22/17 2140 07/23/17 0705 07/23/17 1139  BP: (!) 124/51 129/65 117/65 138/77  Pulse: 70 76 71 80  Resp: 18 (!) '21 19 18  ' Temp:  (!) 97.4 F (36.3 C) (!) 97.4 F (36.3 C)   TempSrc:  Oral Oral   SpO2: 99% 94% 95% 93%  Weight:  127.5 kg (281 lb 1.6 oz)    Height:        Wt Readings from Last 3 Encounters:  07/22/17 127.5 kg (281 lb 1.6 oz)  06/18/17 107 kg (236 lb)  06/10/17 113.4 kg (250 lb)     Intake/Output Summary (Last 24 hours) at 07/23/2017 1457 Last data filed at 07/23/2017 0552 Gross per 24 hour  Intake -  Output 275 ml  Net -275 ml     Physical Exam  Gen: Appears fatigued HEENT: No pallor, moist mucosa, no icterus, supple neck Chest: clear b/l, no added sounds CVS: N S1&S2, no murmurs, rubs or gallop GI: [place] pitting distention with anasarca, bluish discoloration at the back, scrotal swelling Musculoskeletal: warm, 4+ pitting edema bilaterally     Data Review:    CBC Recent Labs  Lab 07/22/17 1734 07/23/17 0413  WBC 4.6 5.6  HGB 11.6* 11.6*  HCT 36.9* 37.4*  PLT 189 192  MCV 101.1* 101.4*  MCH 31.8 31.4  MCHC 31.4 31.0  RDW 18.9* 18.8*  LYMPHSABS 1.2  --   MONOABS 0.9  --   EOSABS 0.2  --   BASOSABS 0.0  --     Chemistries  Recent Labs  Lab 07/22/17 1734 07/23/17 0413  NA 139 139  K 3.2* 3.1*  CL 99 98  CO2 35* 33*  GLUCOSE 96 78  BUN 12 12  CREATININE 0.85 0.81  CALCIUM 8.2* 8.3*  AST 38 38  ALT 25 25  ALKPHOS 172* 164*  BILITOT 2.5* 2.7*   ------------------------------------------------------------------------------------------------------------------ No results for input(s): CHOL, HDL, LDLCALC, TRIG, CHOLHDL, LDLDIRECT in the last 72 hours.  No results found for:  HGBA1C ------------------------------------------------------------------------------------------------------------------ No results for input(s): TSH, T4TOTAL, T3FREE, THYROIDAB in the last 72 hours.  Invalid input(s): FREET3 ------------------------------------------------------------------------------------------------------------------ No results for input(s): VITAMINB12, FOLATE, FERRITIN, TIBC, IRON, RETICCTPCT in the last 72 hours.  Coagulation profile Recent Labs  Lab 07/22/17 1734  INR 1.16    No results for input(s): DDIMER in the last 72 hours.  Cardiac Enzymes No results for input(s): CKMB, TROPONINI, MYOGLOBIN in the last 168 hours.  Invalid input(s): CK ------------------------------------------------------------------------------------------------------------------    Component Value Date/Time   BNP 326.0 (H) 07/22/2017 1734    Inpatient Medications  Scheduled Meds: . atorvastatin  40 mg Oral q1800  . Chlorhexidine Gluconate Cloth  6 each Topical Q0600  . cholecalciferol  2,000 Units Oral Daily  . ferrous sulfate  325 mg Oral BID WC  . gabapentin  600 mg Oral BID  . mouth rinse  15 mL Mouth Rinse BID  . Melatonin  3 mg Oral QHS  . mupirocin ointment  1 application Nasal BID  . polyethylene glycol  17 g Oral Daily  . prednisoLONE acetate  1 drop Left Eye QID  . sertraline  50 mg Oral Daily  . sodium chloride flush  3 mL Intravenous Q12H  . traZODone  150 mg Oral QHS   Continuous Infusions: . sodium chloride     PRN Meds:.sodium chloride, fluticasone, sodium chloride flush  Micro Results Recent Results (from the past 240 hour(s))  MRSA PCR Screening     Status: Abnormal   Collection Time: 07/23/17  1:56 AM  Result Value Ref Range Status   MRSA by PCR POSITIVE (A) NEGATIVE Final    Comment:        The GeneXpert MRSA Assay (FDA approved for NASAL specimens only), is one component of a comprehensive MRSA colonization surveillance program. It is  not intended to diagnose MRSA infection nor to guide or monitor treatment for MRSA infections. RESULT CALLED TO, READ BACK BY AND VERIFIED WITH: JACKSON N. AT [id number] ON [id number] BY THOMPSON S. Performed at [facility], [address] [place], [place] [address]     Radiology Reports Dg Chest 2 View  Result Date: 07/22/2017 CLINICAL DATA:  Shortness of breath and abdominal swelling. EXAM: CHEST - 2 VIEW COMPARISON:  05/27/2016 FINDINGS: Very low bilateral lung volumes. Small bilateral pleural effusions. Mild interstitial pulmonary prominence could be consistent with a component of interstitial edema. No overt airspace edema, focal airspace consolidation or pneumothorax. The heart size is within normal limits. IMPRESSION: Low lung volumes, small bilateral pleural effusions and potential mild interstitial edema. Electronically Signed   By: Aletta Edouard M.D.   On: 07/22/2017 18:44   [place] Ascites (abdomen Limited)  Result Date: 07/23/2017 CLINICAL DATA:  Ascites EXAM: LIMITED ABDOMEN ULTRASOUND FOR ASCITES TECHNIQUE: Limited ultrasound survey for ascites was performed in all four abdominal quadrants. COMPARISON:  04/22/2017 FINDINGS: Small volume ascites identified in the RIGHT lower quadrant. Minimal LEFT lower quadrant ascites. Small amounts of fluid are seen perihepatic as well. Markedly thickened abdominal wall is seen measuring between 7 cm and 9 cm thick with extensive subcutaneous/abdominal wall edema. Volume of ascites is insufficient for paracentesis. IMPRESSION: Small volume ascites insufficient for paracentesis. Markedly thickened abdominal wall with extensive subcutaneous and abdominal wall edema, wall  measuring between 7 cm and 9 cm thick. Electronically Signed   By: Lavonia Dana M.D.   On: 07/23/2017 12:17    Time Spent in minutes 35   Talonda Artist M.D on 07/23/2017 at 2:57 PM  Between 7am to 7pm - Pager - [phone]  After 7pm go to [email or web address] - password [facility]  [facility] -  Office  [phone]

## 2017-07-24 ENCOUNTER — Inpatient Hospital Stay (HOSPITAL_COMMUNITY): Payer: Medicare Other

## 2017-07-24 DIAGNOSIS — I311 Chronic constrictive pericarditis: Secondary | ICD-10-CM | POA: Diagnosis not present

## 2017-07-24 DIAGNOSIS — R0600 Dyspnea, unspecified: Secondary | ICD-10-CM

## 2017-07-24 DIAGNOSIS — G4700 Insomnia, unspecified: Secondary | ICD-10-CM | POA: Diagnosis present

## 2017-07-24 DIAGNOSIS — K7460 Unspecified cirrhosis of liver: Secondary | ICD-10-CM | POA: Diagnosis present

## 2017-07-24 DIAGNOSIS — K429 Umbilical hernia without obstruction or gangrene: Secondary | ICD-10-CM | POA: Diagnosis present

## 2017-07-24 DIAGNOSIS — Z79899 Other long term (current) drug therapy: Secondary | ICD-10-CM | POA: Diagnosis not present

## 2017-07-24 DIAGNOSIS — J9601 Acute respiratory failure with hypoxia: Secondary | ICD-10-CM | POA: Diagnosis not present

## 2017-07-24 DIAGNOSIS — F17210 Nicotine dependence, cigarettes, uncomplicated: Secondary | ICD-10-CM | POA: Diagnosis present

## 2017-07-24 DIAGNOSIS — Z9842 Cataract extraction status, left eye: Secondary | ICD-10-CM | POA: Diagnosis not present

## 2017-07-24 DIAGNOSIS — M545 Low back pain: Secondary | ICD-10-CM | POA: Diagnosis present

## 2017-07-24 DIAGNOSIS — D62 Acute posthemorrhagic anemia: Secondary | ICD-10-CM | POA: Diagnosis present

## 2017-07-24 DIAGNOSIS — F4000 Agoraphobia, unspecified: Secondary | ICD-10-CM | POA: Diagnosis present

## 2017-07-24 DIAGNOSIS — J90 Pleural effusion, not elsewhere classified: Secondary | ICD-10-CM | POA: Diagnosis present

## 2017-07-24 DIAGNOSIS — F319 Bipolar disorder, unspecified: Secondary | ICD-10-CM | POA: Diagnosis present

## 2017-07-24 DIAGNOSIS — G8929 Other chronic pain: Secondary | ICD-10-CM | POA: Diagnosis present

## 2017-07-24 DIAGNOSIS — D66 Hereditary factor VIII deficiency: Secondary | ICD-10-CM | POA: Diagnosis not present

## 2017-07-24 DIAGNOSIS — R591 Generalized enlarged lymph nodes: Secondary | ICD-10-CM | POA: Diagnosis present

## 2017-07-24 DIAGNOSIS — G629 Polyneuropathy, unspecified: Secondary | ICD-10-CM | POA: Diagnosis present

## 2017-07-24 DIAGNOSIS — R188 Other ascites: Secondary | ICD-10-CM | POA: Diagnosis present

## 2017-07-24 DIAGNOSIS — J449 Chronic obstructive pulmonary disease, unspecified: Secondary | ICD-10-CM | POA: Diagnosis present

## 2017-07-24 DIAGNOSIS — M8580 Other specified disorders of bone density and structure, unspecified site: Secondary | ICD-10-CM | POA: Diagnosis present

## 2017-07-24 DIAGNOSIS — D68311 Acquired hemophilia: Secondary | ICD-10-CM | POA: Diagnosis present

## 2017-07-24 DIAGNOSIS — Z961 Presence of intraocular lens: Secondary | ICD-10-CM | POA: Diagnosis present

## 2017-07-24 DIAGNOSIS — F411 Generalized anxiety disorder: Secondary | ICD-10-CM | POA: Diagnosis present

## 2017-07-24 DIAGNOSIS — R601 Generalized edema: Secondary | ICD-10-CM | POA: Diagnosis not present

## 2017-07-24 DIAGNOSIS — E8809 Other disorders of plasma-protein metabolism, not elsewhere classified: Secondary | ICD-10-CM | POA: Diagnosis present

## 2017-07-24 LAB — ECHOCARDIOGRAM LIMITED
Height: 71 in
Weight: 4305.6 oz

## 2017-07-24 LAB — BASIC METABOLIC PANEL
Anion gap: 7 (ref 5–15)
BUN: 12 mg/dL (ref 8–23)
CHLORIDE: 98 mmol/L (ref 98–111)
CO2: 35 mmol/L — AB (ref 22–32)
Calcium: 8.6 mg/dL — ABNORMAL LOW (ref 8.9–10.3)
Creatinine, Ser: 0.8 mg/dL (ref 0.61–1.24)
GFR calc Af Amer: >60 mL/min (ref >60)
GFR calc non Af Amer: >60 mL/min (ref >60)
Glucose, Bld: 83 mg/dL (ref 70–99)
POTASSIUM: 3.2 mmol/L — AB (ref 3.5–5.1)
Sodium: 140 mmol/L (ref 135–145)

## 2017-07-24 LAB — HEPATITIS PANEL, ACUTE
HEP A IGM: NEGATIVE
Hep B C IgM: NEGATIVE
Hepatitis B Surface Ag: NEGATIVE

## 2017-07-24 LAB — MAGNESIUM: Magnesium: 2.1 mg/dL (ref 1.7–2.4)

## 2017-07-24 LAB — IGG: IGG (IMMUNOGLOBIN G), SERUM: 1574 mg/dL (ref 700–1600)

## 2017-07-24 LAB — CERULOPLASMIN: Ceruloplasmin: 29.9 mg/dL (ref 16.0–31.0)

## 2017-07-24 LAB — ALPHA-1-ANTITRYPSIN: A-1 Antitrypsin, Ser: 196 mg/dL (ref 90–200)

## 2017-07-24 MED ORDER — POTASSIUM CHLORIDE CRYS ER 20 MEQ PO TBCR: 40.0000 meq | EXTENDED_RELEASE_TABLET | Freq: Two times a day (BID) | ORAL | 0 refills | Status: DC

## 2017-07-24 MED ORDER — FUROSEMIDE 10 MG/ML IJ SOLN: 80.0000 mg | Freq: Three times a day (TID) | INTRAMUSCULAR | 0 refills | Status: DC

## 2017-07-24 MED ORDER — SPIRONOLACTONE 100 MG PO TABS: 100.0000 mg | ORAL_TABLET | Freq: Every day | ORAL | 0 refills | Status: DC

## 2017-07-24 MED ORDER — FUROSEMIDE 10 MG/ML IJ SOLN
80.0000 mg | Freq: Three times a day (TID) | INTRAMUSCULAR | Status: DC
  Administered 2017-07-24 (×2): 80 mg via INTRAVENOUS
  Filled 2017-07-24 (×2): qty 8

## 2017-07-24 MED ORDER — METOLAZONE 2.5 MG PO TABS: 2.5000 mg | ORAL_TABLET | Freq: Every day | ORAL | 2 refills | Status: DC

## 2017-07-24 NOTE — Progress Notes (Signed)
Foley attempted by tech but unalbe to insert.  Spoke with Dr. Harl Bowie and he said to cancel order for foley.

## 2017-07-24 NOTE — Clinical Social Work Note (Signed)
Pt is a 67 year old male admitted to [facility] from [facility] rehab center. Met with pt and family briefly today to assist with dc planning. Pt informs LCSW that MD is looking at transferring him to [facility] if they have a bed. Pt aware that if he remains hospitalized here, LCSW will assist with dc planning as pt plans to return to [facility] for continued rehab once he gets out of the hospital.

## 2017-07-24 NOTE — Progress Notes (Signed)
  Subjective:  Patient has no complaints at this time.  He states he is able to breathe better and told to lie propped up and breathe.  His sister but he states that he was up all night.  Patient feels abdomen is not as tight as before.  Objective: Blood pressure 137/68, pulse 84, temperature 98 F (36.7 C), temperature source Oral, resp. rate 20, height 5\' 11"  (1.803 m), weight 269 lb 1.6 oz (122.1 kg), SpO2 95 %. Patient is resting in bed. He responds appropriately to questions. His abdomen remains distended with area of ecchymosis which appears to be resolving.  Skin is not as tight as yesterday.  Umbilical hernia is partially reducible and not tender. Penile/scrotal edema has decreased. LE edema with stasis dermatitis involving both legs is unchanged.  Labs/studies Results:  Recent Labs    07-25-17 1734 07/23/17 0413  WBC 4.6 5.6  HGB 11.6* 11.6*  HCT 36.9* 37.4*  PLT 189 192    BMET  Recent Labs    2017/07/25 1734 07/23/17 0413 07/24/17 0437  NA 139 139 140  K 3.2* 3.1* 3.2*  CL 99 98 98  CO2 35* 33* 35*  GLUCOSE 96 78 83  BUN 12 12 12   CREATININE 0.85 0.81 0.80  CALCIUM 8.2* 8.3* 8.6*    LFT  Recent Labs    07/25/2017 1734 07/23/17 0413  PROT 6.4* 6.4*  ALBUMIN 2.6* 2.7*  AST 38 38  ALT 25 25  ALKPHOS 172* 164*  BILITOT 2.5* 2.7*    PT/INR  Recent Labs    2017/07/25 1734  LABPROT 14.7  INR 1.16    Hepatitis Panel  Recent Labs    07/23/17 1339  HEPBSAG Negative  HCVAB <0.1  HEPAIGM Negative  HEPBIGM Negative    Echo results noted.  Assessment:  #1.  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  His fluid overload does not appear to be due to chronic liver disease or cirrhosis.  He is certainly at risk for cardiac liver disease or cirrhosis.  #2.  Mildly elevated bilirubin and alkaline phosphatase most likely due to congestive hepatopathy.  #3.  Anemia.  No evidence  of GI bleed.  Anemia most likely due to recent extensive subcutaneous bleed/ecchymosis resulting from hemophilia.  Recommendations:  Agree with plans to transfer patient to [facility]. May consider elastography at a later date.

## 2017-07-24 NOTE — Progress Notes (Signed)
*  PRELIMINARY RESULTS* Echocardiogram Limited 2-D Echocardiogram has been performed.  Kent Bryant 07/24/2017, 10:35 AM

## 2017-07-24 NOTE — Discharge Summary (Signed)
Physician Discharge Summary  Kent [place] XLK:[id number] DOB: 24-Jan-1950 DOA: 07/22/2017  PCP: Dettinger, Fransisca Kaufmann, MD  Admit date: 07/22/2017 Discharge date: 07/24/2017  Admitted From: Skilled nursing facility ([facility]) Disposition: [facility]  Recommendations for Outpatient Follow-up:  1. Transfer to [facility] under medical service.  Receiving physician is Lytle Butte, MD.   Discharge Condition: Guarded CODE STATUS: Full code Diet recommendation: Heart Healthy with fluid restriction   Discharge Diagnoses:  Principal Problem:   Anasarca  Active Problems:   Acute respiratory failure with hypoxia (HCC)   Constrictive pericarditis   GAD (generalized anxiety disorder)   Hemophilia (HCC)   Bipolar disorder (HCC)   Chronic low back pain   COPD (chronic obstructive pulmonary disease) ([place])  Brief narrative/HPI Please refer to admission H&P for details, in brief, old male with anxiety, bipolar disorder, COPD not on home O2, depression, acquired hemophilia who was followed at [facility] with recent hospitalization and received high-dose pulse dexamethasone and rituximab, prior history of alcohol use presented to the ED from SNF with increasing leg swelling now progressing to abdominal and scrotal swelling with some shortness of breath.  He has been on Lasix by his PCP several weeks back. 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  (Last dose on 7/15).  Per oncologist note he appears to be in remission with last factor VIII activity of 78% with no further bleeding and plan for monthly coag follow-up in outpatient monitoring. Patient had a CT of his abdomen pelvis at that time showing large left posterior lateral wall hematoma and numerous lymphadenopathy.   As per the notes he had ultrasound-guided core needle biopsy by IR of the left lung inguinal lymph node on 6/21 which was nondiagnostic due to insufficient sample.  He then had a PET scan on 6/26 which was not suggestive of aggressive lymphoma and mention that an underlying low-grade lymphoma or lymphoproliferative process was not excluded. The CT of the abdomen pelvis also showed extensive pericardial calcification worrisome for constrictive pericarditis.  2D echo done showed an EF of 55% with a mass on the outside of the RV compressing RV and RA with recommendation to repeat echo to rule out constrictive physiology versus obtaining cardiac MRI limited 2D echo showed hematoma versus mass in the pericardial space.  Cardiac MRI was attempted but could not be done since patient could not tolerate lying flat.  A repeat CT was done at 6/23 without worsening of the mass.  Patient was diuresed and sent to rehab as he was quite weak. At the rehab he was found to be hypoxic with sats at 89% on room air with increasing abdominal distention and was sent to any pain hospital ED for paracentesis and further evaluation. On admission ED spoke with [facility] regarding his recent hemophilia and was cleared for having any procedure done including paracentesis. On admission vitals were stable but had significant anasarca.  Abdominal ultrasound showed small volume ascites which was insufficient for paracentesis.  Chest x-ray showed small bilateral pleural effusion.  Admission labs showed albumin of 2.6, BNP of 326, potassium of 3.2, normal WBC, hemoglobin of 11.6, ESR of 31. Patient started on IV Lasix for diuresis and increased to 80 mg 3 times daily. 2D echo done showed EF of 55 to 60% with septal bounce, mild MR,  flattening of RV and concern for constrictive pericarditis.  Cardiology consulted and recommended transferring to tertiary center for further cardiology and cardiothoracic surgery evaluation for possible need of  pericardiectomy.   Hospital course Anasarca Suspect cardiogenic with diastolic CHF and?  Constrictive pericarditis. Started on IV Lasix 80 mg every 8 hours (negative balance of 1.6 L since admission).  2D echo concerning for constrictive pericarditis.  Echo done at [facility] 1 month back concerning for hematoma versus mass. Discussed with charlie (universal scepter for transfer at [facility]) patient has been accepted to telemetry under medical service.  (Accepting physician is Dr. Horace Porteous).  Spoken with cardiologist Dr Donley Redder who recommends admission to medical service and they can be called for consultation.  Active problems Hypokalemia Replenish with scheduled potassium.    COPD (chronic obstructive pulmonary disease) ([place]) Acute respiratory failure with hypoxia Patient satting at 89% on room air and stable on supplemental O2.  Suspect this is due to anasarca.  Will monitor with diuresis.  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 Per the oncologist note appears to be in remission with last FVIII activity of 78%.  No further bleeding and normal PT plan is to follow his coags monthly and treat him as needed if his FVIII level appears to decline.   Lymphadenopathy -  PET imaging done at [facility] was not suggestive of an aggressive lymphoproliferative disorder,  Biopsy by FNA was non-diagnostic for lymphoma.    Further work-up at [facility].  Bipolar disorder/GAD Stable.  Continue home meds   Consults: Cardiology/ GI  Procedure: Ultrasound abdomen, 2D echo  Disposition: Transfer to [facility]  Family communication: None at bedside (sister involved in care)   Discharge Instructions   Allergies as of 07/24/2017   No Known Allergies     Medication List    STOP taking these medications   cephALEXin 500 MG capsule Commonly known as:  KEFLEX    furosemide 20 MG tablet Commonly known as:  LASIX Replaced by:  furosemide 10 MG/ML injection   potassium chloride 10 MEQ tablet Commonly known as:  K-DUR     TAKE these medications   atorvastatin 40 MG tablet Commonly known as:  LIPITOR TAKE 1 TABLET (40 MG TOTAL) BY MOUTH DAILY AT 6 PM.   cholecalciferol 1000 units tablet Commonly known as:  VITAMIN D Take 2,000 Units by mouth daily.   ferrous sulfate 325 (65 FE) MG tablet Take 325 mg by mouth 2 (two) times daily with a meal.   fluticasone 50 MCG/ACT nasal spray Commonly known as:  FLONASE Place 1 spray into both nostrils 2 (two) times daily as needed for allergies or rhinitis.   furosemide 10 MG/ML injection Commonly known as:  LASIX Inject 8 mLs (80 mg total) into the vein every 8 (eight) hours. Replaces:  furosemide 20 MG tablet   gabapentin 300 MG capsule Commonly known as:  NEURONTIN TAKE 2 CAPSULES (600 MG TOTAL) BY MOUTH 2 (TWO) TIMES DAILY.   Melatonin 5 MG Tabs Take 1 tablet (5 mg total) by mouth at bedtime.   metolazone 2.5 MG tablet Commonly known as:  ZAROXOLYN Take 1 tablet (2.5 mg total) by mouth daily.   polyethylene glycol packet Commonly known as:  MIRALAX / GLYCOLAX Take 17 g by mouth daily.   potassium chloride SA 20 MEQ tablet Commonly known as:  K-DUR,KLOR-CON Take 2 tablets (40 mEq total)  by mouth 2 (two) times daily.   prednisoLONE acetate 1 % ophthalmic suspension Commonly known as:  PRED FORTE Place 1 drop into the left eye 4 (four) times daily.   sertraline 50 MG tablet Commonly known as:  ZOLOFT Take 50 mg by mouth daily.   spironolactone 100 MG tablet Commonly known as:  ALDACTONE Take 1 tablet (100 mg total) by mouth daily. Start taking on:  07/25/2017   traZODone 150 MG tablet Commonly known as:  DESYREL Take 150 mg by mouth at bedtime.       No Known Allergies      Procedures/Studies: Dg Chest 2 View  Result Date: 07/22/2017 CLINICAL DATA:  Shortness of  breath and abdominal swelling. EXAM: CHEST - 2 VIEW COMPARISON:  05/27/2016 FINDINGS: Very low bilateral lung volumes. Small bilateral pleural effusions. Mild interstitial pulmonary prominence could be consistent with a component of interstitial edema. No overt airspace edema, focal airspace consolidation or pneumothorax. The heart size is within normal limits. IMPRESSION: Low lung volumes, small bilateral pleural effusions and potential mild interstitial edema. Electronically Signed   By: Aletta Edouard M.D.   On: 07/22/2017 18:44   [place] Ascites (abdomen Limited)  Result Date: 07/23/2017 CLINICAL DATA:  Ascites EXAM: LIMITED ABDOMEN ULTRASOUND FOR ASCITES TECHNIQUE: Limited ultrasound survey for ascites was performed in all four abdominal quadrants. COMPARISON:  04/22/2017 FINDINGS: Small volume ascites identified in the RIGHT lower quadrant. Minimal LEFT lower quadrant ascites. Small amounts of fluid are seen perihepatic as well. Markedly thickened abdominal wall is seen measuring between 7 cm and 9 cm thick with extensive subcutaneous/abdominal wall edema. Volume of ascites is insufficient for paracentesis. IMPRESSION: Small volume ascites insufficient for paracentesis. Markedly thickened abdominal wall with extensive subcutaneous and abdominal wall edema, wall measuring between 7 cm and 9 cm thick. Electronically Signed   By: Lavonia Dana M.D.   On: 07/23/2017 12:17    2D echo Study Conclusions  - Limited echo to evaluate further for constrictive physiology. - Left ventricle: Systolic function was normal. The estimated   ejection fraction was in the range of 60% to 65%. There is a   septal bounce present. - There is 25% respiratory variation across the MV and 30% across   the TV. Medial e&' 16, lateral e&' 10, E/A ratio 3.2 and decel time   150. IVC is dilated and fixed. There is respiratory variation   across the hepatic vein. - Findings consistent with constrictive  physiology.    Subjective: Still quite swollen and feels uncomfortable.  Denies any chest pain.  Discharge Exam: Vitals:   07/24/17 0718 07/24/17 1507  BP: (!) 112/58 137/68  Pulse: 68 84  Resp: 20 20  Temp: 98 F (36.7 C)   SpO2: 96% 95%   Vitals:   07/23/17 2216 07/24/17 0716 07/24/17 0718 07/24/17 1507  BP: 117/61  (!) 112/58 137/68  Pulse: 73  68 84  Resp: '18  20 20  ' Temp: 97.7 F (36.5 C)  98 F (36.7 C)   TempSrc: Oral  Oral   SpO2: 94%  96% 95%  Weight:  122.1 kg (269 lb 1.6 oz)    Height:        General: Elderly male appears fatigued HEENT: No pallor, moist mucosa, JVD not appreciated, no icterus, supple neck Chest: Diminished bibasilar breath sounds  CVS: Normal S1-S2, no murmurs or gallop GI: Diffuse anasarca with severe pitting edema of bilateral legs, scrotum and abdomen with protuberant umbilicus.  Bluish discoloration of the  back Musculoskeletal: 4+ pitting edema bilaterally CNS: Alert and oriented  The results of significant diagnostics from this hospitalization (including imaging, microbiology, ancillary and laboratory) are listed below for reference.     Microbiology: Recent Results (from the past 240 hour(s))  MRSA PCR Screening     Status: Abnormal   Collection Time: 07/23/17  1:56 AM  Result Value Ref Range Status   MRSA by PCR POSITIVE (A) NEGATIVE Final    Comment:        The GeneXpert MRSA Assay (FDA approved for NASAL specimens only), is one component of a comprehensive MRSA colonization surveillance program. It is not intended to diagnose MRSA infection nor to guide or monitor treatment for MRSA infections. RESULT CALLED TO, READ BACK BY AND VERIFIED WITH: JACKSON N. AT 0748A ON [id number] BY THOMPSON S. Performed at [facility], [address] [place], [place] [address]      Labs: BNP (last 3 results) Recent Labs    07/22/17 1734  BNP 902.2*   Basic Metabolic Panel: Recent Labs  Lab 07/22/17 1734 07/23/17 0413  07/24/17 0437  NA 139 139 140  K 3.2* 3.1* 3.2*  CL 99 98 98  CO2 35* 33* 35*  GLUCOSE 96 78 83  BUN '12 12 12  ' CREATININE 0.85 0.81 0.80  CALCIUM 8.2* 8.3* 8.6*  MG  --   --  2.1   Liver Function Tests: Recent Labs  Lab 07/22/17 1734 07/23/17 0413  AST 38 38  ALT 25 25  ALKPHOS 172* 164*  BILITOT 2.5* 2.7*  PROT 6.4* 6.4*  ALBUMIN 2.6* 2.7*   No results for input(s): LIPASE, AMYLASE in the last 168 hours. No results for input(s): AMMONIA in the last 168 hours. CBC: Recent Labs  Lab 07/22/17 1734 07/23/17 0413  WBC 4.6 5.6  NEUTROABS 2.2  --   HGB 11.6* 11.6*  HCT 36.9* 37.4*  MCV 101.1* 101.4*  PLT 189 192   Cardiac Enzymes: No results for input(s): CKTOTAL, CKMB, CKMBINDEX, TROPONINI in the last 168 hours. BNP: Invalid input(s): POCBNP CBG: No results for input(s): GLUCAP in the last 168 hours. D-Dimer No results for input(s): DDIMER in the last 72 hours. Hgb A1c No results for input(s): HGBA1C in the last 72 hours. Lipid Profile No results for input(s): CHOL, HDL, LDLCALC, TRIG, CHOLHDL, LDLDIRECT in the last 72 hours. Thyroid function studies No results for input(s): TSH, T4TOTAL, T3FREE, THYROIDAB in the last 72 hours.  Invalid input(s): FREET3 Anemia work up Recent Labs    07/23/17 1339  FERRITIN 299  TIBC 340  IRON 58   Urinalysis No results found for: COLORURINE, APPEARANCEUR, LABSPEC, [place], GLUCOSEU, [place], [place], [place], PROTEINUR, UROBILINOGEN, NITRITE, LEUKOCYTESUR Sepsis Labs Invalid input(s): PROCALCITONIN,  WBC,  LACTICIDVEN Microbiology Recent Results (from the past 240 hour(s))  MRSA PCR Screening     Status: Abnormal   Collection Time: 07/23/17  1:56 AM  Result Value Ref Range Status   MRSA by PCR POSITIVE (A) NEGATIVE Final    Comment:        The GeneXpert MRSA Assay (FDA approved for NASAL specimens only), is one component of a comprehensive MRSA colonization surveillance program. It is not intended to  diagnose MRSA infection nor to guide or monitor treatment for MRSA infections. RESULT CALLED TO, READ BACK BY AND VERIFIED WITH: JACKSON N. AT 0748A ON [id number] BY THOMPSON S. Performed at [facility], [address], [place], [place] [address]      Time coordinating discharge: 35 minutes  SIGNED:   Louellen Molder, MD  [facility] 07/24/2017, 3:17 PM Pager   If 7PM-7AM, please contact night-coverage [email or web address] Password [email or web address]

## 2017-07-24 NOTE — Progress Notes (Signed)
Patient being transferred to [facility] called and given to Christell Constant RN. [facility] to provide transportation. Receiving physician Dr Lytle Butte.Marland Kitchen

## 2017-07-24 NOTE — Progress Notes (Signed)
New foam dressing applied to left buttock.  Barrier cream applied to scrotum

## 2017-07-24 NOTE — Progress Notes (Signed)
Rounded on patient. Sleeping quietly, no distress noted. Will continue to monitor.

## 2017-07-24 NOTE — Progress Notes (Signed)
[facility] bed placement informed me that patient had a ready bed at this time. Bed coordinator provided me with numbers to call for report. Patient was informed that a bed was available. Will continue to monitor patient.

## 2017-07-24 NOTE — Consult Note (Addendum)
Cardiology Consultation:   Patient ID: Kent Bryant; [id number]; 1950/08/26   Admit date: 07/22/2017 Date of Consult: 07/24/2017  Primary Care Provider: Dettinger, Fransisca Kaufmann, MD Primary Cardiologist: Carlyle Dolly, MD  Chief Complaint: diffuse swelling  Patient Profile:   Kent Bryant is a 67 y.o. 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 Clementeen Graham.  History of Present Illness:   He's been followed as OP by primary care since 2018 for severe peripheral edema requiring combination of Lasix, metolazone and Unna boots. 2D echo at that time showed normal LV function, normal diastolic function, normal pericardium.  In June 2019 he says he had a fall and everything "went downhill." He was evaluated at [facility] for severe flank/abdominal ecchymosis and found to have: 1) hemophilia with FVIII inhibitor (had flank/abdominal ecchymosis). He received pulse of high dose dexamethasone and rituximab. 2) CT abd pelvis showed large left posterolateral wall hematoma and numerous lymphadenopathy. Regarding lymphadenopathy, DC summary states, "Surgery consulted 6/19 but were not amenable for biopsy of any of the patient's inguinal or pelvic enlarged LN due to concerns for bleeding. IR obtained US guided core needle biopsy of a L inguinal LN on 6/21 which was nondiagnostic due to insufficient sample. PET scan obtained 06/26 did not reveal hypermetabolic adenopathy to suggest aggressive lymphoma, but instead noted decreased size of adenopathy. Although we can not exclude an underlying low-grade lymphoma or other lymphoproliferative process, no further workup was indicated at this time." 3) CT abd pelvis also noted extensive pericardial calcification worrisome for constrictive pericarditis. 2D echo there 06/2017  showed EF 55%, septal bounce, mass on outside of RV compressing RV and RA with recommendation to repeat echo to rule out constrictive physiology versus obtain cMRI. Limited 2D echo showed hematoma/mass seen in the pericardial space. Per DC summary, "A cardiac MRI was recommended to further characterize the pericardial mass but the patient was unable to tolerate lying flat for a cardiac MRI. Consensus was that this heterogeneous mass in the pericardium was a hematoma. CT chest 6/23 showed no significant worsening of the mass." He was diuresed with Lasix. He does not appear to have seen cardiology in follow-up.  He was sent to rehab for poor conditioning. In April-June he went back to PCP for worsening edema. OP US showed small ascites, subtle irregularity of hepatic contours, cannot completely exclude cirrhosis. Further eval of liver was planned with CT but the patient had transportation issues and did not yet go. He had not had prior liver issues other than fatty liver.  CT abd pelvis 2018 mild hepatic steatosis. At his rehab facility he was found to be hypoxic 89% RA and sent to [facility] to consider paracentesis and further eval. IM notes this admission indicate EDP spoke with [facility] and hemophilia is felt in remission and he would be cleared for further procedures. He was seen by GI for concern for cirrhosis who is undergoing screening liver w/u. Abd US shows small volume ascites insufficient for paracentesis, markedly thickened abdominal wall with extensive subcutaneous and abdominal wall edema. CT abd/pelvis planned. CXR with small bilateral pleural effusions and potential mild interstitial edema. Labs show albumin down to 2.6, BNP 326, K 3.1-3.2, CO2 35, Hgb 11.6, PT/INR wnl, ESR 31. 2D echo shows EF 55-60% with mixed diastolic findings, septal bounce, mild MR, blunting of PV flow, flattening of RV, signs concerning  for constrictive pericarditis - limited echo ordered to evaluate respiratory variation of inflow  as well as repeat mitral inflow, tissue velocities, obtain hepatic flow Dopplers. He's been started on IV Lasix 53m BID and spironolactone 1054mdaily. Weight 281 on adm, down to 269 today but not concordant with I/Os.  He appears markedly edematous, even to the point where his penis is extremely swollen. He is SOB with minimal movement, and reports orthopnea. No CP. Vitals currently stable on [place]. He is still able to produce UOP.  Past Medical History:  Diagnosis Date  . Abnormal CXR (chest x-ray)   . Agoraphobia   . Anxiety   . Bipolar disorder ([facility]  . Chronic low back pain   . Coagulopathy ([facility]  . COPD (chronic obstructive pulmonary disease) ([facility]  . Depression   . Depression   . Hemophilia ([facility]  . Insomnia   . Osteopenia   . Peripheral edema   . Peripheral neuropathy   . Peripheral neuropathy   . Vitamin D deficiency     Past Surgical History:  Procedure Laterality Date  . CATARACT EXTRACTION  05/29/2017  . CATARACT EXTRACTION W/PHACO Left 06/13/2017   Procedure: CATARACT EXTRACTION PHACO AND INTRAOCULAR LENS PLACEMENT LEFT EYE;  Surgeon: WrBaruch GoldmannMD;  Location: [facility];  Service: Ophthalmology;  Laterality: Left;  CDE: 23.32     Inpatient Medications: Scheduled Meds: . atorvastatin  40 mg Oral q1800  . Chlorhexidine Gluconate Cloth  6 each Topical Q0600  . cholecalciferol  2,000 Units Oral Daily  . ferrous sulfate  325 mg Oral BID WC  . furosemide  80 mg Intravenous Q8H  . gabapentin  600 mg Oral BID  . mouth rinse  15 mL Mouth Rinse BID  . Melatonin  3 mg Oral QHS  . mupirocin ointment  1 application Nasal BID  . polyethylene glycol  17 g Oral Daily  . potassium chloride  40 mEq Oral BID  . prednisoLONE acetate  1 drop Left Eye QID  . sertraline  50 mg Oral Daily  . sodium chloride flush  3 mL Intravenous Q12H  . spironolactone  100 mg Oral Daily  . traZODone  150 mg Oral QHS   Continuous Infusions: . sodium chloride     PRN Meds: sodium  chloride, fluticasone, sodium chloride flush  Home Meds: Prior to Admission medications   Medication Sig Start Date End Date Taking? Authorizing Provider  atorvastatin (LIPITOR) 40 MG tablet TAKE 1 TABLET (40 MG TOTAL) BY MOUTH DAILY AT 6 PM. 04/04/17  Yes Dettinger, JoFransisca KaufmannMD  cholecalciferol (VITAMIN D) 1000 units tablet Take 2,000 Units by mouth daily.   Yes [provider]  ferrous sulfate 325 (65 FE) MG tablet Take 325 mg by mouth 2 (two) times daily with a meal.   Yes [provider]  fluticasone (FLONASE) 50 MCG/ACT nasal spray Place 1 spray into both nostrils 2 (two) times daily as needed for allergies or rhinitis. 08/28/16  Yes Dettinger, JoFransisca KaufmannMD  furosemide (LASIX) 20 MG tablet TAKE 2 TABLETS (40 MG TOTAL) BY MOUTH DAILY. 06/11/16  Yes Dettinger, JoFransisca KaufmannMD  gabapentin (NEURONTIN) 300 MG capsule TAKE 2 CAPSULES (600 MG TOTAL) BY MOUTH 2 (TWO) TIMES DAILY. 06/16/17  Yes Dettinger, JoFransisca KaufmannMD  Melatonin 5 MG TABS Take 1 tablet (5 mg total) by mouth at bedtime. 02/28/17  Yes Dettinger, JoFransisca KaufmannMD  polyethylene glycol (MIRALAX / GLYCOLAX) packet Take 17 g by mouth  daily.   Yes [provider]  prednisoLONE acetate (PRED FORTE) 1 % ophthalmic suspension Place 1 drop into the left eye 4 (four) times daily.   Yes [provider]  sertraline (ZOLOFT) 50 MG tablet Take 50 mg by mouth daily. 03/18/17  Yes [provider]  traZODone (DESYREL) 150 MG tablet Take 150 mg by mouth at bedtime. 03/18/17  Yes [provider]  cephALEXin (KEFLEX) 500 MG capsule Take 1 capsule (500 mg total) by mouth 4 (four) times daily. Patient not taking: Reported on 07/22/2017 06/18/17   Dettinger, Fransisca Kaufmann, MD  metolazone (ZAROXOLYN) 2.5 MG tablet TAKE 1 TABLET BY MOUTH EVERY DAY Patient not taking: Reported on 07/22/2017 05/07/17   Dettinger, Fransisca Kaufmann, MD  potassium chloride (K-DUR) 10 MEQ tablet Take 1 tablet three times daily Patient not taking: Reported on  07/22/2017 04/04/17   Dettinger, Fransisca Kaufmann, MD    Allergies:   No Known Allergies  Social History:   Social History   Socioeconomic History  . Marital status: Single    Spouse name: Not on file  . Number of children: Not on file  . Years of education: Not on file  . Highest education level: Not on file  Occupational History  . Not on file  Social Needs  . Financial resource strain: Not on file  . Food insecurity:    Worry: Not on file    Inability: Not on file  . Transportation needs:    Medical: Not on file    Non-medical: Not on file  Tobacco Use  . Smoking status: Current Every Day Smoker    Packs/day: 0.50    Years: 15.00    Pack years: 7.50    Types: Cigarettes  . Smokeless tobacco: Never Used  Substance and Sexual Activity  . Alcohol use: No  . Drug use: No  . Sexual activity: Yes    Birth control/protection: None  Lifestyle  . Physical activity:    Days per week: Not on file    Minutes per session: Not on file  . Stress: Not on file  Relationships  . Social connections:    Talks on phone: Not on file    Gets together: Not on file    Attends religious service: Not on file    Active member of club or organization: Not on file    Attends meetings of clubs or organizations: Not on file    Relationship status: Not on file  . Intimate partner violence:    Fear of current or ex partner: Not on file    Emotionally abused: Not on file    Physically abused: Not on file    Forced sexual activity: Not on file  Other Topics Concern  . Not on file  Social History Narrative  . Not on file    Family History:   The patient's family history includes Stroke in his mother.  ROS:  Please see the history of present illness.  Generalized FTT.All other ROS reviewed and negative.     Physical Exam/Data:   Vitals:   07/23/17 2024 07/23/17 2216 07/24/17 0716 07/24/17 0718  BP:  117/61  (!) 112/58  Pulse:  73  68  Resp:  18  20  Temp:  97.7 F (36.5 C)  98 F (36.7  C)  TempSrc:  Oral  Oral  SpO2: 99% 94%  96%  Weight:   269 lb 1.6 oz (122.1 kg)   Height:  Intake/Output Summary (Last 24 hours) at 07/24/2017 0921 Last data filed at 07/23/2017 1800 Gross per 24 hour  Intake 240 ml  Output 550 ml  Net -310 ml   Filed Weights   07/22/17 1714 07/22/17 2140 07/24/17 0716  Weight: 260 lb (117.9 kg) 281 lb 1.6 oz (127.5 kg) 269 lb 1.6 oz (122.1 kg)   Body mass index is 37.53 kg/m.  General: Gray pallor, chronically ill appearing WM in no acute distress but SOB with minimal change in position Head: Normocephalic, atraumatic, sclera non-icteric, no xanthomas, nares are without discharge.  Neck: Negative for carotid bruits. JVD not as elevated as expected for level of anasarca Lungs: Diminished BS throughout without wheezes, rales, or rhonchi. Breathing is unlabored. Heart: RRR with S1 S2. No murmurs, rubs, or gallops appreciated. Abdomen: Soft, non-tender, non-distended with normoactive bowel sounds. No hepatomegaly. No rebound/guarding. No obvious abdominal masses. Extensive left sided flank/back ecchymosis Msk:  Strength and tone appear normal for age. Extremities: No clubbing or cyanosis. Diffuse anasarca throughout his entire body up to sacrum and back. Penis and testicles extremely swollen Neuro: Alert and oriented X 3. No facial asymmetry. No focal deficit. Moves all extremities spontaneously. Psych:  Responds to questions appropriately with a normal affect.  EKG:  The EKG was personally reviewed and demonstrates NSR 72bpm with low voltage, occ PACs (not afib), nonspecific STT changes   Laboratory Data:  Chemistry Recent Labs  Lab 07/22/17 1734 07/23/17 0413 07/24/17 0437  NA 139 139 140  K 3.2* 3.1* 3.2*  CL 99 98 98  CO2 35* 33* 35*  GLUCOSE 96 78 83  BUN _0 CREATININE 0.85 0.81 0.80  CALCIUM 8.2* 8.3* 8.6*  GFRNONAA >60 >60 >60  GFRAA >60 >60 >60  ANIONGAP _1 Recent Labs  Lab 07/22/17 1734 07/23/17 0413    PROT 6.4* 6.4*  ALBUMIN 2.6* 2.7*  AST 38 38  ALT 25 25  ALKPHOS 172* 164*  BILITOT 2.5* 2.7*   Hematology Recent Labs  Lab 07/22/17 1734 07/23/17 0413  WBC 4.6 5.6  RBC 3.65* 3.69*  HGB 11.6* 11.6*  HCT 36.9* 37.4*  MCV 101.1* 101.4*  MCH 31.8 31.4  MCHC 31.4 31.0  RDW 18.9* 18.8*  PLT 189 192   Cardiac EnzymesNo results for input(s): TROPONINI in the last 168 hours. No results for input(s): TROPIPOC in the last 168 hours.  BNP Recent Labs  Lab 07/22/17 1734  BNP 326.0*    DDimer No results for input(s): DDIMER in the last 168 hours.  Radiology/Studies:  Dg Chest 2 View  Result Date: 07/22/2017 CLINICAL DATA:  Shortness of breath and abdominal swelling. EXAM: CHEST - 2 VIEW COMPARISON:  05/27/2016 FINDINGS: Very low bilateral lung volumes. Small bilateral pleural effusions. Mild interstitial pulmonary prominence could be consistent with a component of interstitial edema. No overt airspace edema, focal airspace consolidation or pneumothorax. The heart size is within normal limits. IMPRESSION: Low lung volumes, small bilateral pleural effusions and potential mild interstitial edema. Electronically Signed   By: Aletta Edouard M.D.   On: 07/22/2017 18:44   [place] Ascites (abdomen Limited)  Result Date: 07/23/2017 CLINICAL DATA:  Ascites EXAM: LIMITED ABDOMEN ULTRASOUND FOR ASCITES TECHNIQUE: Limited ultrasound survey for ascites was performed in all four abdominal quadrants. COMPARISON:  04/22/2017 FINDINGS: Small volume ascites identified in the RIGHT lower quadrant. Minimal LEFT lower quadrant ascites. Small amounts of fluid are seen perihepatic as well. Markedly thickened abdominal wall is seen measuring  between 7 cm and 9 cm thick with extensive subcutaneous/abdominal wall edema. Volume of ascites is insufficient for paracentesis. IMPRESSION: Small volume ascites insufficient for paracentesis. Markedly thickened abdominal wall with extensive subcutaneous and abdominal wall  edema, wall measuring between 7 cm and 9 cm thick. Electronically Signed   By: Lavonia Dana M.D.   On: 07/23/2017 12:17    Assessment and Plan:   1. Anasarca with concern for constrictive pericarditis 2. Recent chest wall hematoma, possible pericardial hematoma, flank/abdominal ecchymosis in setting of hemophilia 3. Hypoxia with bilateral pleural effusions 4. Severe hypoalbuminemia 5. Hypokalemia 6. Widespread lymphadenopathy on CT 06/2017 of unclear cause  This patient appears critically ill. He is presenting as cirrhotic but presentation of constrictive pericarditis can often be confused for cirrhosis. He has signs of diffuse edema on exam with sacral edema, BLE, pleural effusions, penile and testicular edema. I was unable to reach echo dept myself so I asked nursing to please contact to come repeat limited echo as Dr. Harl Bowie had suggested. He likely needs further eval of constrictive pericarditis to include eval for HIV, TB, etc. He has reached the point where he may require pericardectomy. Have asked Dr. Harl Bowie to see patient early in rounds so we can decide further course of treatment which I suspect will include transfer to tertiary center.   For questions or updates, please contact [place] Please consult [email or web address] for contact info under Cardiology/STEMI.    Signed, Charlie Pitter, PA-C  07/24/2017 9:21 AM   Attending Note Patient seen and discussed with PA Dunn, I agree with her documentation above. 67 yo male history of hemophilia followed at [facility], bipolar disorder, COPD admitted with anasarca and hypoxia. Progressing edema x 2 weeks. Echo has signs concerning with constrictive pericarditis (septal bounce, high E/A ratio with decel time 150 but relatively preserved tissue Doppler velocities, pulm vein systolic flow blunting, normal sized atria). Follow up imaging shows 25% respirtory variation across the MV and 30% acorss the TV, he has respiratory variation of flow of the  hepatic veins.  Clinically he has severe HF out of proportion to LV systolic/diastolic function and RV function. Prior CT at [facility] showed evidence of pericardial calcifications. Overall everything fits for constrictive percarditis Will place foley catheter and increase lasix to 17m IV tid.   From notes recent admission 06/2017 where constricitive pathophysiology was noted. CT showed extensive pericardial calcification. TTE there showed a linear mass that looked to be compression the RV and RA, cardiac MRI was recommended but patient was not able to lay flat. It was thought the mass in the pericardium was a hematoma.   [facility] will need consutation with cardiology and likely CT surgery there   JCarlyle DollyMD

## 2017-07-25 ENCOUNTER — Ambulatory Visit: Admit: 2017-07-25 | Payer: Medicaid Other | Admitting: Ophthalmology

## 2017-07-25 ENCOUNTER — Telehealth: Payer: Self-pay | Admitting: Cardiology

## 2017-07-25 LAB — ANTINUCLEAR ANTIBODIES, IFA: ANA Ab, IFA: NEGATIVE

## 2017-07-25 LAB — ANTI-SMOOTH MUSCLE ANTIBODY, IGG: F-ACTIN AB IGG: 10 U (ref 0–19)

## 2017-07-25 SURGERY — PHACOEMULSIFICATION, CATARACT, WITH IOL INSERTION
Anesthesia: Monitor Anesthesia Care | Laterality: Right

## 2017-07-25 MED ORDER — SERTRALINE HCL 50 MG PO TABS
50.00 | ORAL_TABLET | ORAL | Status: DC
Start: 2017-08-01 — End: 2017-07-25

## 2017-07-25 MED ORDER — ATORVASTATIN CALCIUM 40 MG PO TABS
40.00 | ORAL_TABLET | ORAL | Status: DC
Start: 2017-07-31 — End: 2017-07-25

## 2017-07-25 MED ORDER — KETOROLAC TROMETHAMINE 0.5 % OP SOLN
1.00 | OPHTHALMIC | Status: DC
Start: 2017-07-31 — End: 2017-07-25

## 2017-07-25 MED ORDER — SPIRONOLACTONE 25 MG PO TABS
100.00 | ORAL_TABLET | ORAL | Status: DC
Start: 2017-07-26 — End: 2017-07-25

## 2017-07-25 MED ORDER — GABAPENTIN 300 MG PO CAPS
600.00 | ORAL_CAPSULE | ORAL | Status: DC
Start: 2017-07-31 — End: 2017-07-25

## 2017-07-25 MED ORDER — FUROSEMIDE 10 MG/ML IJ SOLN
40.00 | INTRAMUSCULAR | Status: DC
Start: 2017-07-27 — End: 2017-07-25

## 2017-07-25 MED ORDER — MELATONIN 3 MG PO TABS
6.00 | ORAL_TABLET | ORAL | Status: DC
Start: 2017-07-31 — End: 2017-07-25

## 2017-07-25 MED ORDER — PREDNISOLONE ACETATE 1 % OP SUSP
1.00 | OPHTHALMIC | Status: DC
Start: 2017-07-31 — End: 2017-07-25

## 2017-07-25 MED ORDER — GENERIC EXTERNAL MEDICATION
150.00 | Status: DC
Start: 2017-07-31 — End: 2017-07-25

## 2017-07-27 MED ORDER — GENERIC EXTERNAL MEDICATION
5.00 | Status: DC
Start: 2017-07-28 — End: 2017-07-27

## 2017-07-31 MED ORDER — ENOXAPARIN SODIUM 40 MG/0.4ML ~~LOC~~ SOLN
40.00 | SUBCUTANEOUS | Status: DC
Start: 2017-08-01 — End: 2017-07-31

## 2017-07-31 MED ORDER — LACTULOSE 10 GM/15ML PO SOLN
10.00 g | ORAL | Status: DC
Start: 2017-07-31 — End: 2017-07-31

## 2017-07-31 MED ORDER — FUROSEMIDE 40 MG PO TABS
40.00 | ORAL_TABLET | ORAL | Status: DC
Start: 2017-07-31 — End: 2017-07-31

## 2017-08-07 ENCOUNTER — Telehealth: Payer: Self-pay | Admitting: *Deleted

## 2017-08-07 NOTE — Telephone Encounter (Signed)
VM from [place] Pt has appt this week Legs have small amount of drainage, swelling is the same as there last visit. Weight is coming down.

## 2017-08-07 NOTE — Telephone Encounter (Signed)
Okay thanks for the info, will look forward to see him

## 2017-08-08 ENCOUNTER — Encounter: Payer: Self-pay | Admitting: Family Medicine

## 2017-08-08 ENCOUNTER — Ambulatory Visit (INDEPENDENT_AMBULATORY_CARE_PROVIDER_SITE_OTHER): Payer: Medicare Other | Admitting: Family Medicine

## 2017-08-08 VITALS — BP 119/70 | HR 74 | Temp 97.4°F | Ht 71.0 in | Wt 229.8 lb

## 2017-08-08 DIAGNOSIS — I872 Venous insufficiency (chronic) (peripheral): Secondary | ICD-10-CM

## 2017-08-08 DIAGNOSIS — R601 Generalized edema: Secondary | ICD-10-CM | POA: Diagnosis not present

## 2017-08-08 DIAGNOSIS — I311 Chronic constrictive pericarditis: Secondary | ICD-10-CM | POA: Diagnosis not present

## 2017-08-08 LAB — CBC WITH DIFFERENTIAL/PLATELET
BASOS: 0 %
Basophils Absolute: 0 10*3/uL (ref 0.0–0.2)
EOS (ABSOLUTE): 0.2 10*3/uL (ref 0.0–0.4)
EOS: 2 %
HEMATOCRIT: 38.7 % (ref 37.5–51.0)
Hemoglobin: 12.3 g/dL — ABNORMAL LOW (ref 13.0–17.7)
IMMATURE GRANULOCYTES: 0 %
Immature Grans (Abs): 0 10*3/uL (ref 0.0–0.1)
Lymphocytes Absolute: 1.7 10*3/uL (ref 0.7–3.1)
Lymphs: 19 %
MCH: 30.8 pg (ref 26.6–33.0)
MCHC: 31.8 g/dL (ref 31.5–35.7)
MCV: 97 fL (ref 79–97)
MONOS ABS: 1.4 10*3/uL — AB (ref 0.1–0.9)
Monocytes: 15 %
NEUTROS PCT: 64 %
Neutrophils Absolute: 5.9 10*3/uL (ref 1.4–7.0)
PLATELETS: 171 10*3/uL (ref 150–450)
RBC: 3.99 x10E6/uL — ABNORMAL LOW (ref 4.14–5.80)
RDW: 16.8 % — AB (ref 12.3–15.4)
WBC: 9.3 10*3/uL (ref 3.4–10.8)

## 2017-08-08 LAB — CMP14+EGFR
ALT: 22 IU/L (ref 0–44)
AST: 43 IU/L — ABNORMAL HIGH (ref 0–40)
Albumin/Globulin Ratio: 1 — ABNORMAL LOW (ref 1.2–2.2)
Albumin: 3.2 g/dL — ABNORMAL LOW (ref 3.6–4.8)
Alkaline Phosphatase: 178 IU/L — ABNORMAL HIGH (ref 39–117)
BUN / CREAT RATIO: 16 (ref 10–24)
BUN: 15 mg/dL (ref 8–27)
Bilirubin Total: 2.4 mg/dL — ABNORMAL HIGH (ref 0.0–1.2)
CALCIUM: 8.6 mg/dL (ref 8.6–10.2)
CO2: 32 mmol/L — ABNORMAL HIGH (ref 20–29)
CREATININE: 0.94 mg/dL (ref 0.76–1.27)
Chloride: 89 mmol/L — ABNORMAL LOW (ref 96–106)
GFR calc non Af Amer: 85 mL/min/{1.73_m2} (ref >59)
GFR, EST AFRICAN AMERICAN: 98 mL/min/{1.73_m2} (ref >59)
Globulin, Total: 3.1 g/dL (ref 1.5–4.5)
Glucose: 97 mg/dL (ref 65–99)
Potassium: 3.8 mmol/L (ref 3.5–5.2)
Sodium: 136 mmol/L (ref 134–144)
TOTAL PROTEIN: 6.3 g/dL (ref 6.0–8.5)

## 2017-08-08 NOTE — Progress Notes (Signed)
BP 119/70   Pulse 74   Temp (!) 97.4 F (36.3 C) (Oral)   Ht _0  (1.803 m)   Wt 229 lb 12.8 oz (104.2 kg)   SpO2 97%   BMI 32.05 kg/m    Subjective:    Patient ID: Crews Mccollam, male    DOB: May 08, 1950, 67 y.o.   MRN: [id number]  HPI: Dajon Lazar is a 67 y.o. male presenting on 08/08/2017 for Hospitalization Follow-up ([facility]); Fatigue; and Foot Swelling (bilateral )   HPI Hospital follow-up for anasarca and fatigue and swelling Patient has been having some chronic swelling but it acutely worsened and he went into the hospital at [facility] and was admitted on 07/25/2017 and 08/01/17.  When he went in he was swollen in his abdomen and groin and lower legs look like anasarca which was worse than what he had been before.  We had tried to give him Lasix 20 to help with it before but he did not seem to help and it just got worse.  When he went to the hospital they increased his Lasix and gave him some IV to help reduce the swelling.  He says he was up as high as 239 pounds and now weighs 229 here in the office today.  He also had shortness of breath because of the swelling.  While in the hospital they did an ultrasound of his heart showing pericarditis.  He is also still had significant swelling in his legs and now he is seeping again from his legs and having the sores pop up again where the drainage is.  He said the Unna boots really helped him significantly before and he would not mind doing them again.  He denies any fevers or chills.  He does have close follow-up with both the cardiothoracic surgeon for his heart and has home health care  Relevant past medical, surgical, family and social history reviewed and updated as indicated. Interim medical history since our last visit reviewed. Allergies and medications reviewed and updated.  Review of Systems  Constitutional: Positive for fatigue. Negative for chills and fever.  Respiratory: Negative for cough,  shortness of breath and wheezing.   Cardiovascular: Positive for leg swelling. Negative for chest pain and palpitations.  Gastrointestinal: Positive for abdominal distention. Negative for abdominal pain, constipation, diarrhea, nausea and vomiting.  Musculoskeletal: Negative for back pain and gait problem.  Skin: Negative for rash.  Neurological: Positive for weakness. Negative for dizziness, light-headedness, numbness and headaches.  All other systems reviewed and are negative.   Per HPI unless specifically indicated above   Allergies as of 08/08/2017   No Known Allergies     Medication List        Accurate as of 08/08/17 11:37 AM. Always use your most recent med list.          atorvastatin 40 MG tablet Commonly known as:  LIPITOR TAKE 1 TABLET (40 MG TOTAL) BY MOUTH DAILY AT 6 PM.   cholecalciferol 1000 units tablet Commonly known as:  VITAMIN D Take 2,000 Units by mouth daily.   ferrous sulfate 325 (65 FE) MG tablet Take 325 mg by mouth 2 (two) times daily with a meal.   folic acid 1 MG tablet Commonly known as:  FOLVITE Take 1 tablet by mouth daily at 2 PM.   furosemide 40 MG tablet Commonly known as:  LASIX Take 40 mg by mouth.   furosemide 10 MG/ML injection Commonly known as:  LASIX Inject 8 mLs (80 mg total) into the vein every 8 (eight) hours.   gabapentin 300 MG capsule Commonly known as:  NEURONTIN TAKE 2 CAPSULES (600 MG TOTAL) BY MOUTH 2 (TWO) TIMES DAILY.   Melatonin 5 MG Tabs Take 1 tablet (5 mg total) by mouth at bedtime.   metolazone 2.5 MG tablet Commonly known as:  ZAROXOLYN Take 1 tablet (2.5 mg total) by mouth daily.   multivitamin tablet Take 1 tablet by mouth daily.   sertraline 50 MG tablet Commonly known as:  ZOLOFT Take 50 mg by mouth daily.   thiamine 100 MG tablet Take 100 mg by mouth daily.   traZODone 150 MG tablet Commonly known as:  DESYREL Take 150 mg by mouth at bedtime.          Objective:    BP 119/70    Pulse 74   Temp (!) 97.4 F (36.3 C) (Oral)   Ht _0  (1.803 m)   Wt 229 lb 12.8 oz (104.2 kg)   SpO2 97%   BMI 32.05 kg/m   Wt Readings from Last 3 Encounters:  08/08/17 229 lb 12.8 oz (104.2 kg)  07/24/17 269 lb 1.6 oz (122.1 kg)  06/18/17 236 lb (107 kg)    Physical Exam  Constitutional: He is oriented to person, place, and time. He appears well-developed and well-nourished. No distress.  Eyes: Conjunctivae are normal. No scleral icterus.  Neck: Neck supple. No thyromegaly present.  Cardiovascular: Normal rate, regular rhythm, normal heart sounds and intact distal pulses.  No murmur heard. Pulmonary/Chest: Effort normal and breath sounds normal. No respiratory distress. He has no wheezes.  Abdominal: He exhibits distension (Abdominal distention). There is no tenderness. There is no guarding.  Musculoskeletal: Normal range of motion. He exhibits edema (3+ edema bilateral lower extremities with weeping and very superficial wound scattered throughout the stasis dermatitis of both legs).  Lymphadenopathy:    He has no cervical adenopathy.  Neurological: He is alert and oriented to person, place, and time. Coordination normal.  Skin: Skin is warm and dry. No rash noted. He is not diaphoretic.  Psychiatric: He has a normal mood and affect. His behavior is normal.  Nursing note and vitals reviewed.       Assessment & Plan:   Problem List Items Addressed This Visit      Cardiovascular and Mediastinum   Constrictive pericarditis - Primary   Relevant Medications   furosemide (LASIX) 40 MG tablet     Other   Anasarca   Relevant Orders   CBC with Differential/Platelet   CMP14+EGFR   Apply unna boot    Other Visit Diagnoses    Chronic venous stasis dermatitis       Patient has stasis dermatitis with weeping, continue the Lasix 40 daily and daily weights.  Will check CBC and come today to see how the Lasix is doing   Relevant Orders   CBC with Differential/Platelet    CMP14+EGFR   Apply unna boot      He has hospital follow-up with cardiothoracic surgeon on 08/20/2017 with Dr. Canary Brim to look into his heart and why he had pericarditis and a possible tumor there and the pericardial effusion.  He currently has home health care from [place] coming to his house and we will have them change his Unna boot every week and if it starts to look worse he should come in to see [place].  Nurse to place an Haematologist Follow up plan: Return if  symptoms worsen or fail to improve, for Follow-up in 1 week if home health care cannot change Unna boots.  Counseling provided for all of the vaccine components Orders Placed This Encounter  Procedures  . CBC with Differential/Platelet  . CMP14+EGFR  . Apply unna boot    Caryl Pina, MD [facility] 08/08/2017, 11:37 AM

## 2017-08-18 ENCOUNTER — Other Ambulatory Visit: Payer: Self-pay | Admitting: Family Medicine

## 2017-08-18 ENCOUNTER — Encounter: Payer: Self-pay | Admitting: Family Medicine

## 2017-08-18 ENCOUNTER — Ambulatory Visit (INDEPENDENT_AMBULATORY_CARE_PROVIDER_SITE_OTHER): Payer: Medicare Other | Admitting: Family Medicine

## 2017-08-18 VITALS — BP 100/55 | HR 71 | Temp 98.0°F | Ht 71.0 in | Wt 231.5 lb

## 2017-08-18 DIAGNOSIS — R17 Unspecified jaundice: Secondary | ICD-10-CM

## 2017-08-18 DIAGNOSIS — L02415 Cutaneous abscess of right lower limb: Secondary | ICD-10-CM

## 2017-08-18 DIAGNOSIS — R600 Localized edema: Secondary | ICD-10-CM

## 2017-08-18 DIAGNOSIS — L03115 Cellulitis of right lower limb: Secondary | ICD-10-CM | POA: Diagnosis not present

## 2017-08-18 DIAGNOSIS — R609 Edema, unspecified: Secondary | ICD-10-CM

## 2017-08-18 DIAGNOSIS — G609 Hereditary and idiopathic neuropathy, unspecified: Secondary | ICD-10-CM

## 2017-08-18 MED ORDER — AMOXICILLIN-POT CLAVULANATE 875-125 MG PO TABS: 1.0000 | ORAL_TABLET | Freq: Two times a day (BID) | ORAL | 0 refills | Status: DC

## 2017-08-18 MED ORDER — FUROSEMIDE 40 MG PO TABS: ORAL_TABLET | ORAL | 0 refills | Status: DC

## 2017-08-18 NOTE — Addendum Note (Signed)
Addended by: Marylin Crosby on: 08/18/2017 07:03 PM   Modules accepted: Orders

## 2017-08-18 NOTE — Progress Notes (Signed)
Subjective:  Patient ID: Kent Bryant, male    DOB: [birth year]  Age: 67 y.o. MRN: [id number]  CC: Leg Swelling (pt here today c/o leg swelling)   HPI Kent Bryant presents for bilateral swelling in the legs.  He is been wearing Unna boots per Dr. Warrick Parisian is treatment plan.  However swelling has increased and there was some erythema noted above Unna boot bandage of the right leg along with swelling of the area earlier today by home health.  They were supposed to change the boot but decided he needed to be seen here instead.  Patient has noted a lot of swelling.  He is treated with Lasix.  He was recently reduced on his dose from 40 mg twice to once a day.  His creatinine level was drawn recently and that came back in normal range.  Patient does have a history of congestive heart failure as well and cannot lay flat to sleep therefore it is hard for him to elevate his leg as well.  Wife tells me today if she gives history that they do now have a hospital bed for him.  Patient has been seen by multiple providers in multiple locations including 3 different hospitals in the region.  Apparently he is now under the care of cardiology at [facility] and they will be making some determinations about his congestive heart failure and hepatic treatment in the next few days. Depression screen [facility] 2/9 08/08/2017 06/18/2017 06/10/2017  Decreased Interest 0 0 0  Down, Depressed, Hopeless 0 1 2  PHQ - 2 Score 0 1 2  Altered sleeping - - 2  Tired, decreased energy - - 3  Change in appetite - - 1  Feeling bad or failure about yourself  - - 2  Trouble concentrating - - 1  Moving slowly or fidgety/restless - - 0  Suicidal thoughts - - 0  PHQ-9 Score - - 11  Some recent data might be hidden    History Kent Bryant has a past medical history of Abnormal CXR (chest x-ray), Agoraphobia, Anxiety, Bipolar disorder (HCC), Chronic low back pain, Coagulopathy (HCC), COPD (chronic obstructive pulmonary disease) ([place]), Depression,  Depression, 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   He has a past surgical history that includes Cataract extraction (05/29/2017) and Cataract extraction w/PHACO (Left, 06/13/2017).   His family history includes Stroke in his mother.He reports that he has been smoking cigarettes. He has a 7.50 pack-year smoking history. He has never used smokeless tobacco. He reports that he does not drink alcohol or use drugs.    ROS Review of Systems  Constitutional: Negative for fever.  Respiratory: Negative for shortness of breath.   Cardiovascular: Negative for chest pain.  Musculoskeletal: Negative for arthralgias.  Skin: Negative for rash.    Objective:  BP (!) 100/55   Pulse 71   Temp 98 F (36.7 C) (Oral)   Ht 5\' 11"  (1.803 m)   Wt 231 lb 8 oz (105 kg)   BMI 32.29 kg/m   BP Readings from Last 3 Encounters:  08/18/17 (!) 100/55  08/08/17 119/70  07/24/17 128/67    Wt Readings from Last 3 Encounters:  08/18/17 231 lb 8 oz (105 kg)  08/08/17 229 lb 12.8 oz (104.2 kg)  07/24/17 269 lb 1.6 oz (122.1 kg)     Physical Exam  Constitutional: He is oriented to person, place, and time. He appears well-developed and well-nourished. No distress.  HENT:  Head:  Normocephalic and atraumatic.  Right Ear: External ear normal.  Left Ear: External ear normal.  Nose: Nose normal.  Mouth/Throat: Oropharynx is clear and moist.  Eyes: Pupils are equal, round, and reactive to light. Conjunctivae and EOM are normal. Right eye exhibits no discharge. Left eye exhibits no discharge. Scleral icterus is present.  Neck: Normal range of motion. Neck supple.  Cardiovascular: Normal rate, regular rhythm and normal heart sounds.  No murmur heard. Pulmonary/Chest: Effort normal. No respiratory distress. He has no wheezes. He has rales ([place]).  Abdominal: Soft. He exhibits distension. There is no tenderness.    Musculoskeletal: Normal range of motion. He exhibits edema (3+ at the superior aspect of the right leg at the upper margin of the Unna boot.  Significant erythema in this area as well.).  Neurological: He is alert and oriented to person, place, and time. He has normal reflexes.  Skin: Skin is warm and dry.  Jaundiced  Psychiatric: He has a normal mood and affect. His behavior is normal. Judgment and thought content normal.      Assessment & Plan:   Kent Bryant was seen today for leg swelling.  Diagnoses and all orders for this visit:  Peripheral edema  Cellulitis and abscess of right lower extremity  Jaundice -     Ambulatory referral to Gastroenterology  Hyperbilirubinemia -     Ambulatory referral to Gastroenterology  Other orders -     furosemide (LASIX) 40 MG tablet; Take 1 PO QAM and at noon -     amoxicillin-clavulanate (AUGMENTIN) 875-125 MG tablet; Take 1 tablet by mouth 2 (two) times daily.       I have discontinued Dae Bailey's furosemide. I have also changed his furosemide. Additionally, I am having him start on amoxicillin-clavulanate. Lastly, I am having him maintain his Melatonin, atorvastatin, sertraline, traZODone, ferrous sulfate, cholecalciferol, metolazone, folic acid, thiamine, multivitamin, and gabapentin.  Allergies as of 08/18/2017   No Known Allergies     Medication List        Accurate as of 08/18/17  5:02 PM. Always use your most recent med list.          amoxicillin-clavulanate 875-125 MG tablet Commonly known as:  AUGMENTIN Take 1 tablet by mouth 2 (two) times daily.   atorvastatin 40 MG tablet Commonly known as:  LIPITOR TAKE 1 TABLET (40 MG TOTAL) BY MOUTH DAILY AT 6 PM.   cholecalciferol 1000 units tablet Commonly known as:  VITAMIN D Take 2,000 Units by mouth daily.   ferrous sulfate 325 (65 FE) MG tablet Take 325 mg by mouth 2 (two) times daily with a meal.   folic acid 1 MG tablet Commonly known as:  FOLVITE Take 1 tablet by  mouth daily at 2 PM.   furosemide 40 MG tablet Commonly known as:  LASIX Take 1 PO QAM and at noon   gabapentin 300 MG capsule Commonly known as:  NEURONTIN TAKE 2 CAPSULES (600 MG TOTAL) BY MOUTH 2 (TWO) TIMES DAILY.   Melatonin 5 MG Tabs Take 1 tablet (5 mg total) by mouth at bedtime.   metolazone 2.5 MG tablet Commonly known as:  ZAROXOLYN Take 1 tablet (2.5 mg total) by mouth daily.   multivitamin tablet Take 1 tablet by mouth daily.   sertraline 50 MG tablet Commonly known as:  ZOLOFT Take 50 mg by mouth daily.   thiamine 100 MG tablet Take 100 mg by mouth daily.   traZODone 150 MG tablet Commonly known as:  DESYREL Take  150 mg by mouth at bedtime.        Follow-up: Return in about 2 weeks (around 09/01/2017), or with Dr. Bryon Lions.  Claretta Fraise, M.D.

## 2017-08-19 ENCOUNTER — Telehealth: Payer: Self-pay | Admitting: Family Medicine

## 2017-08-19 ENCOUNTER — Telehealth: Payer: Self-pay | Admitting: *Deleted

## 2017-08-19 NOTE — Telephone Encounter (Signed)
Iona Beard at [facility] aware.

## 2017-08-19 NOTE — Telephone Encounter (Signed)
Please resume.

## 2017-08-19 NOTE — Telephone Encounter (Signed)
"  As tolerated" WS

## 2017-08-19 NOTE — Telephone Encounter (Signed)
Kent Bryant from [facility] is calling to see if he needs to hold physical therapy at all due to leg wound or are they ok to resume with plan.

## 2017-08-22 ENCOUNTER — Telehealth: Payer: Self-pay | Admitting: Family Medicine

## 2017-08-22 NOTE — Telephone Encounter (Signed)
Sister aware.  

## 2017-08-22 NOTE — Telephone Encounter (Signed)
Please review and advise.

## 2017-08-22 NOTE — Telephone Encounter (Signed)
We can discuss placing those orders when they come in for the appointment, those orders usually require a visit to place them anyways.  If they want to start researching facilities so when they come see me they know which facility they would prefer to go to and they can even start calling up the facilities and asking about possible bed availability or getting him on a waiting list Caryl Pina, MD [place] 08/22/2017, 1:05 PM

## 2017-08-27 ENCOUNTER — Ambulatory Visit: Payer: Medicare Other | Admitting: Family Medicine

## 2017-08-28 ENCOUNTER — Ambulatory Visit (INDEPENDENT_AMBULATORY_CARE_PROVIDER_SITE_OTHER): Payer: Medicare Other | Admitting: Family Medicine

## 2017-08-28 ENCOUNTER — Ambulatory Visit: Payer: Medicaid Other | Admitting: Family Medicine

## 2017-08-28 ENCOUNTER — Encounter: Payer: Self-pay | Admitting: Family Medicine

## 2017-08-28 VITALS — BP 104/65 | HR 68 | Temp 98.1°F | Ht 71.0 in | Wt 231.0 lb

## 2017-08-28 DIAGNOSIS — L89301 Pressure ulcer of unspecified buttock, stage 1: Secondary | ICD-10-CM

## 2017-08-28 DIAGNOSIS — I872 Venous insufficiency (chronic) (peripheral): Secondary | ICD-10-CM

## 2017-08-28 DIAGNOSIS — J418 Mixed simple and mucopurulent chronic bronchitis: Secondary | ICD-10-CM | POA: Diagnosis not present

## 2017-08-28 DIAGNOSIS — E785 Hyperlipidemia, unspecified: Secondary | ICD-10-CM | POA: Diagnosis not present

## 2017-08-28 DIAGNOSIS — R601 Generalized edema: Secondary | ICD-10-CM | POA: Diagnosis not present

## 2017-08-28 DIAGNOSIS — Z6837 Body mass index (BMI) 37.0-37.9, adult: Secondary | ICD-10-CM

## 2017-08-28 DIAGNOSIS — R531 Weakness: Secondary | ICD-10-CM

## 2017-08-28 DIAGNOSIS — R5381 Other malaise: Secondary | ICD-10-CM

## 2017-08-28 MED ORDER — ALBUTEROL SULFATE HFA 108 (90 BASE) MCG/ACT IN AERS: 2.0000 | INHALATION_SPRAY | Freq: Four times a day (QID) | RESPIRATORY_TRACT | 0 refills | Status: DC | PRN

## 2017-08-28 NOTE — Progress Notes (Signed)
BP 104/65   Pulse 68   Temp 98.1 F (36.7 C)   Ht '5\' 11"'  (1.803 m)   Wt 231 lb (104.8 kg)   BMI 32.22 kg/m    Subjective:    Patient ID: Kent Bryant, male    DOB: 1950-06-29, 67 y.o.   MRN: [id number]  HPI: Kent Bryant is a 67 y.o. male presenting on 08/28/2017 for Follow-up   HPI Hyperlipidemia Patient is coming in for recheck of his hyperlipidemia. The patient is currently taking tour. They deny any issues with myalgias or history of liver damage from it. They deny any focal numbness or weakness or chest pain.   COPD Patient is coming in for COPD recheck today.  He is currently on albuterol as needed.  He has a mild chronic cough but denies any major coughing spells or wheezing spells.  He has 2nighttime symptoms per week and 2daytime symptoms per week currently.   Anasarca and swelling and peripheral edema Patient's anasarca and swelling and peripheral edema has led to him being much less mobile than what he has been previously which is led to severe physical deconditioning and he is mostly wheelchair-bound at this point, he can ambulate with support but only very short distances.  His sister is here with him today and just feels like he is gotten to the point where they can no longer take care of him especially with his size and wants to talk about rehab to try to strengthen him to get a back to where he can take care of himself.  He has developed a pressure sore on the center of his buttocks overlying the coccyx that home wound care has continued to see him for his stasis dermatitis and doing wraps on his lower legs.  His cardiologist increased his furosemide to 80 and 40 in the a.m. and p.m. to try and help with some of the fluid but it does not seem to be helping significantly.  Patient has become significantly weaker and has become more and more sedentary and sits in his chair most of the day.  Relevant past medical, surgical, family and social history reviewed and updated as  indicated. Interim medical history since our last visit reviewed. Allergies and medications reviewed and updated.  Review of Systems  Constitutional: Negative for chills and fever.  HENT: Positive for congestion.   Respiratory: Positive for cough, shortness of breath (He still gets short of breath at times especially when he is laying flat) and wheezing. Negative for chest tightness.   Cardiovascular: Positive for leg swelling. Negative for chest pain.  Gastrointestinal: Positive for abdominal distention. Negative for abdominal pain, constipation, diarrhea, nausea and vomiting.  Genitourinary: Positive for scrotal swelling.  Musculoskeletal: Negative for back pain and gait problem.  Skin: Positive for wound. Negative for rash.  Neurological: Positive for weakness. Negative for dizziness, light-headedness and numbness.  All other systems reviewed and are negative.   Per HPI unless specifically indicated above   Allergies as of 08/28/2017   No Known Allergies     Medication List        Accurate as of 08/28/17  2:40 PM. Always use your most recent med list.          amoxicillin-clavulanate 875-125 MG tablet Commonly known as:  AUGMENTIN Take 1 tablet by mouth 2 (two) times daily.   atorvastatin 40 MG tablet Commonly known as:  LIPITOR TAKE 1 TABLET (40 MG TOTAL) BY MOUTH DAILY AT 6 PM.   CENTRUM  SILVER 50+MEN PO Take 1 tablet by mouth daily.   cholecalciferol 1000 units tablet Commonly known as:  VITAMIN D Take 2,000 Units by mouth daily.   CLEAR EYES OP Apply 1 drop to eye daily.   ferrous sulfate 325 (65 FE) MG tablet Take 325 mg by mouth 2 (two) times daily with a meal.   folic acid 1 MG tablet Commonly known as:  FOLVITE Take 1 tablet by mouth daily at 2 PM.   furosemide 40 MG tablet Commonly known as:  LASIX Take 1 PO QAM and at noon   gabapentin 300 MG capsule Commonly known as:  NEURONTIN TAKE 2 CAPSULES (600 MG TOTAL) BY MOUTH 2 (TWO) TIMES DAILY.     KLOR-CON M20 PO Take 1 tablet by mouth daily.   Melatonin 5 MG Tabs Take 1 tablet (5 mg total) by mouth at bedtime.   metolazone 2.5 MG tablet Commonly known as:  ZAROXOLYN Take 1 tablet (2.5 mg total) by mouth daily.   multivitamin tablet Take 1 tablet by mouth daily.   sertraline 50 MG tablet Commonly known as:  ZOLOFT Take 50 mg by mouth daily.   thiamine 100 MG tablet Take 100 mg by mouth daily.   traZODone 150 MG tablet Commonly known as:  DESYREL Take 150 mg by mouth at bedtime.          Objective:    BP 104/65   Pulse 68   Temp 98.1 F (36.7 C)   Ht '5\' 11"'  (1.803 m)   Wt 231 lb (104.8 kg)   BMI 32.22 kg/m   Wt Readings from Last 3 Encounters:  08/28/17 231 lb (104.8 kg)  08/18/17 231 lb 8 oz (105 kg)  08/08/17 229 lb 12.8 oz (104.2 kg)    Physical Exam  Constitutional: He is oriented to person, place, and time. He appears well-developed and well-nourished. No distress.  Eyes: Conjunctivae are normal. Right eye exhibits no discharge. No scleral icterus.  Neck: Neck supple. No thyromegaly present.  Cardiovascular: Normal rate, regular rhythm, normal heart sounds and intact distal pulses.  No murmur heard. Pulmonary/Chest: Effort normal and breath sounds normal. No respiratory distress. He has no wheezes. He has no rales.  Abdominal: He exhibits distension.  Musculoskeletal: Normal range of motion. He exhibits edema (2+ edema with weeping from stasis dermatitis).  Able to stand on his own but unable to ambulates more than 4 5 feet without being unstable and feeling weak and tired  Lymphadenopathy:    He has no cervical adenopathy.  Neurological: He is alert and oriented to person, place, and time. Coordination normal.  Skin: Skin is warm and dry. No rash noted. He is not diaphoretic.     Psychiatric: He has a normal mood and affect. His behavior is normal.  Nursing note and vitals reviewed.       Assessment & Plan:   Problem List Items  Addressed This Visit      Respiratory   COPD (chronic obstructive pulmonary disease) ([place]) - Primary   Relevant Medications   albuterol (PROVENTIL HFA;VENTOLIN HFA) 108 (90 Base) MCG/ACT inhaler   Other Relevant Orders   CMP14+EGFR     Other   Hyperlipidemia with target LDL less than 130   Relevant Orders   Lipid panel   BMI 37.0-37.9, adult   Relevant Orders   Lipid panel   Anasarca   Relevant Orders   CMP14+EGFR    Other Visit Diagnoses    Physical deconditioning  Weakness       Pressure injury of buttock, stage 1, unspecified laterality       Pinpoint pressure ulcer, stage I in the center of buttocks overlying coccyx, already has home health care wound care they gave him a topical barrier cream   Venous stasis dermatitis of both lower extremities       Patient has weeping in both of his legs, needs wraps and Unna boot placed weekly      Patient has general deconditioning and is becoming more and more wheelchair-bound  We are doing FL 2 to get placement in [facility], patient already has a bed lined up for there.  Recommend continued Unna boot bilateral, change weekly  Recommend topical barrier cream twice daily on buttocks Follow up plan: Return in about 4 weeks (around 09/25/2017), or if symptoms worsen or fail to improve, for COPD and cholesterol recheck.  Counseling provided for all of the vaccine components Orders Placed This Encounter  Procedures  . CMP14+EGFR  . Lipid panel    Caryl Pina, MD [facility] 08/28/2017, 2:40 PM

## 2017-08-29 ENCOUNTER — Ambulatory Visit (INDEPENDENT_AMBULATORY_CARE_PROVIDER_SITE_OTHER): Payer: Medicare Other | Admitting: *Deleted

## 2017-08-29 DIAGNOSIS — I872 Venous insufficiency (chronic) (peripheral): Secondary | ICD-10-CM | POA: Diagnosis not present

## 2017-08-29 LAB — CMP14+EGFR
A/G RATIO: 0.9 — AB (ref 1.2–2.2)
ALT: 23 IU/L (ref 0–44)
AST: 46 IU/L — ABNORMAL HIGH (ref 0–40)
Albumin: 2.9 g/dL — ABNORMAL LOW (ref 3.6–4.8)
Alkaline Phosphatase: 144 IU/L — ABNORMAL HIGH (ref 39–117)
BUN/Creatinine Ratio: 15 (ref 10–24)
BUN: 13 mg/dL (ref 8–27)
Bilirubin Total: 1.7 mg/dL — ABNORMAL HIGH (ref 0.0–1.2)
CALCIUM: 8.7 mg/dL (ref 8.6–10.2)
CO2: 33 mmol/L — AB (ref 20–29)
CREATININE: 0.89 mg/dL (ref 0.76–1.27)
Chloride: 92 mmol/L — ABNORMAL LOW (ref 96–106)
GFR, EST AFRICAN AMERICAN: 104 mL/min/{1.73_m2} (ref >59)
GFR, EST NON AFRICAN AMERICAN: 90 mL/min/{1.73_m2} (ref >59)
GLUCOSE: 75 mg/dL (ref 65–99)
Globulin, Total: 3.3 g/dL (ref 1.5–4.5)
Potassium: 3.4 mmol/L — ABNORMAL LOW (ref 3.5–5.2)
Sodium: 139 mmol/L (ref 134–144)
TOTAL PROTEIN: 6.2 g/dL (ref 6.0–8.5)

## 2017-08-29 LAB — LIPID PANEL
CHOL/HDL RATIO: 3 ratio (ref 0.0–5.0)
Cholesterol, Total: 88 mg/dL — ABNORMAL LOW (ref 100–199)
HDL: 29 mg/dL — AB (ref >39)
LDL Calculated: 46 mg/dL (ref 0–99)
Triglycerides: 65 mg/dL (ref 0–149)
VLDL Cholesterol Cal: 13 mg/dL (ref 5–40)

## 2017-08-29 NOTE — Progress Notes (Signed)
Bilateral leg redness with weeping Per Dr Warrick Parisian, Louretta Parma boots placed on both lower legs

## 2017-09-04 ENCOUNTER — Ambulatory Visit (INDEPENDENT_AMBULATORY_CARE_PROVIDER_SITE_OTHER): Payer: Medicare Other

## 2017-09-04 DIAGNOSIS — J449 Chronic obstructive pulmonary disease, unspecified: Secondary | ICD-10-CM | POA: Diagnosis not present

## 2017-09-04 DIAGNOSIS — I509 Heart failure, unspecified: Secondary | ICD-10-CM

## 2017-09-04 DIAGNOSIS — I311 Chronic constrictive pericarditis: Secondary | ICD-10-CM | POA: Diagnosis not present

## 2017-09-04 DIAGNOSIS — G629 Polyneuropathy, unspecified: Secondary | ICD-10-CM

## 2017-09-04 DIAGNOSIS — E785 Hyperlipidemia, unspecified: Secondary | ICD-10-CM

## 2017-09-04 DIAGNOSIS — R599 Enlarged lymph nodes, unspecified: Secondary | ICD-10-CM

## 2017-09-04 DIAGNOSIS — F419 Anxiety disorder, unspecified: Secondary | ICD-10-CM

## 2017-09-04 DIAGNOSIS — F319 Bipolar disorder, unspecified: Secondary | ICD-10-CM

## 2017-09-04 DIAGNOSIS — Z9181 History of falling: Secondary | ICD-10-CM

## 2017-09-04 DIAGNOSIS — D68311 Acquired hemophilia: Secondary | ICD-10-CM

## 2017-09-12 ENCOUNTER — Telehealth: Payer: Self-pay | Admitting: Family Medicine

## 2017-09-15 ENCOUNTER — Telehealth: Payer: Self-pay

## 2017-09-15 ENCOUNTER — Telehealth: Payer: Self-pay | Admitting: Family Medicine

## 2017-09-15 DIAGNOSIS — R7989 Other specified abnormal findings of blood chemistry: Secondary | ICD-10-CM

## 2017-09-15 DIAGNOSIS — R945 Abnormal results of liver function studies: Principal | ICD-10-CM

## 2017-09-15 NOTE — Telephone Encounter (Signed)
Go ahead and do referral to gastroenterology for elevated liver function, I believe he Artie had one but I am fine with doing a referral to [facility]

## 2017-09-15 NOTE — Telephone Encounter (Signed)
Referral made 

## 2017-09-15 NOTE — Telephone Encounter (Signed)
Yes go ahead and place referral

## 2017-09-15 NOTE — Telephone Encounter (Signed)
Please review and advise.

## 2017-09-15 NOTE — Telephone Encounter (Signed)
Cardiologist said he needs a referral to GI  Something going on with liver

## 2017-09-15 NOTE — Telephone Encounter (Signed)
What type of referral do you need? GI specialist in the [place] office in [place]. Heart doctor thinks his liver function is too high and needs to see GI specialist  Have you been seen at our office for this problem? Been seeing [facility] (If no, schedule them an appointment.  They will need to be seen before a referral can be done.)  Is there a particular doctor or location that you prefer? [place] across from [place]  Patient notified that referrals can take up to a week or longer to process. If they haven't heard anything within a week they should call back and speak with the referral department.

## 2017-09-15 NOTE — Telephone Encounter (Signed)
Faxed last EKG we done in 2016. No Biopsy on file noted that Kent Bryant has most recent EKG.

## 2017-09-19 ENCOUNTER — Ambulatory Visit (INDEPENDENT_AMBULATORY_CARE_PROVIDER_SITE_OTHER): Payer: Medicare Other | Admitting: Gastroenterology

## 2017-09-19 ENCOUNTER — Ambulatory Visit: Payer: Medicare Other | Admitting: Gastroenterology

## 2017-09-19 ENCOUNTER — Encounter: Payer: Self-pay | Admitting: Gastroenterology

## 2017-09-19 ENCOUNTER — Other Ambulatory Visit (INDEPENDENT_AMBULATORY_CARE_PROVIDER_SITE_OTHER): Payer: Medicare Other

## 2017-09-19 VITALS — BP 110/52 | HR 60 | Ht 71.0 in | Wt 192.2 lb

## 2017-09-19 DIAGNOSIS — R945 Abnormal results of liver function studies: Secondary | ICD-10-CM

## 2017-09-19 DIAGNOSIS — R935 Abnormal findings on diagnostic imaging of other abdominal regions, including retroperitoneum: Secondary | ICD-10-CM | POA: Diagnosis not present

## 2017-09-19 DIAGNOSIS — R7989 Other specified abnormal findings of blood chemistry: Secondary | ICD-10-CM

## 2017-09-19 DIAGNOSIS — R748 Abnormal levels of other serum enzymes: Secondary | ICD-10-CM | POA: Diagnosis not present

## 2017-09-19 DIAGNOSIS — R160 Hepatomegaly, not elsewhere classified: Secondary | ICD-10-CM | POA: Diagnosis not present

## 2017-09-19 DIAGNOSIS — R601 Generalized edema: Secondary | ICD-10-CM

## 2017-09-19 LAB — HEPATIC FUNCTION PANEL
ALT: 20 U/L (ref 0–53)
AST: 41 U/L — ABNORMAL HIGH (ref 0–37)
Albumin: 3.3 g/dL — ABNORMAL LOW (ref 3.5–5.2)
Alkaline Phosphatase: 125 U/L — ABNORMAL HIGH (ref 39–117)
BILIRUBIN TOTAL: 1.9 mg/dL — AB (ref 0.2–1.2)
Bilirubin, Direct: 0.9 mg/dL — ABNORMAL HIGH (ref 0.0–0.3)
Total Protein: 7.4 g/dL (ref 6.0–8.3)

## 2017-09-19 LAB — CORTISOL: Cortisol, Plasma: 13 ug/dL

## 2017-09-19 LAB — TSH: TSH: 2.2 u[IU]/mL (ref 0.35–4.50)

## 2017-09-19 LAB — BILIRUBIN, DIRECT: Bilirubin, Direct: 0.9 mg/dL — ABNORMAL HIGH (ref 0.0–0.3)

## 2017-09-19 LAB — BRAIN NATRIURETIC PEPTIDE: PRO B NATRI PEPTIDE: 312 pg/mL — AB (ref 0.0–100.0)

## 2017-09-19 LAB — PROTIME-INR
INR: 1.4 ratio — ABNORMAL HIGH (ref 0.8–1.0)
Prothrombin Time: 16 s — ABNORMAL HIGH (ref 9.6–13.1)

## 2017-09-19 LAB — GAMMA GT: GGT: 68 U/L — ABNORMAL HIGH (ref 7–51)

## 2017-09-19 LAB — IGA: IGA: 576 mg/dL — AB (ref 68–378)

## 2017-09-19 LAB — ALKALINE PHOSPHATASE: ALK PHOS: 125 U/L — AB (ref 39–117)

## 2017-09-19 NOTE — Progress Notes (Signed)
[place] VISIT   Primary Care Provider Dettinger, Fransisca Kaufmann, MD [place] [address] [phone]  Referring Provider Dettinger, Fransisca Kaufmann, MD [address] [place], [place] [address] [phone]  Patient Profile: Kent Bryant is a 67 y.o. 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  The patient presents to the [facility] for an evaluation and management of problem(s) noted below:  Problem List 1. Abnormal LFTs   2. Abnormal [place] (ultrasound) of abdomen   3. Hepatomegaly   4. Blood alkaline phosphatase increased compared with prior measurement     History of Present Illness: This is the patient's first visit to the [facility] [place] clinic.  He was referred by his new primary cardiologist concern for significant liver disease rather than cardiac disease of the etiology of his lower extremity edema as well as his anasarca.  Unfortunately no cardiology records are available for our review we will need to get those.  Patient has been living at a nursing facility/long-term acute care rehab since recent admissions in July and August.  He has had multiple medications adjusted over the course of the last few weeks and we have to medication charts that defer and the family feels that medications need to continue to be adjusted and they are not clear what he is taking the rehabilitation center.  They are concerned about his weight loss as well as his trembling and confusion that has been occurring.  In brief, this summer when the patient was hospitalized at [facility] in June he was found to have a bilirubin of 4.9 with a normal alkaline phosphatase and an AST of 44 and ALT of 15 during his hospitalization his bilirubin climbed to as high as 8.3.  However his liver biochemical tests including  alk phos, AST, ALT did not significantly trend upwards.  There is no direct bilirubin that I can see in the system.  Eventually at the time of his discharge and then follow-up it does seem in July that he had a bilirubin of 3 with a alkaline phosphatase of 150 and an AST ALT of 31/19.  The last documentation of a lab shows a bilirubin of 2.5, alkaline phosphatase 159, AST 37, ALT 19.  His albumin over the course of the summer had averaged between 2.8 and 3.1.  His CO2 has been chronically elevated during his entire summer including his hospitalization at [facility].  We had a history and physical from July 2019.  At that time in July 2019, he had presented with hypoxia and inability to obtain paracentesis studies due to low volume.  He was diuresed.  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  He has had a large left posterior lateral wall hematoma and numerous lymphadenopathy requiring an IR biopsy did not show a lymphoma but could still be hiding a low-grade lymphoma or lymphoproliferative process.  During his hospital stay awake for the rest he was treated for an exacerbation of heart failure.  His BNP was 326.  His EF which was 55 to 60% with concern for constrictive pericarditis.  Labs proBNP elevated as high as 452.  Cardiology he recommended a CT surgery consultation is felt that the clinical presentation of the patient as well as imaging were consistent with constrictive pericarditis but due to poor functional status it was not felt to be a surgical  candidate and would require medical optimization and follow-up.  Because of borderline low blood pressures it looks like spironolactone was discontinued at one point in time during the hospital stay.  His hyperbilirubinemia as noted above was felt to be a result of his acquired factor VIII inhibitor.  There was an elevated ammonia of 82 for which the patient was started on lactulose because of concern for waxing and waning mental status and  whether hyperammonemia was playing a role or not.  In regards to RFs for liver disease: High-Risk Sexual Practices -denied History of Alcohol Use - Social alcohol use History of IVDU - N/A History of Tattoos - N/A History of Blood Donation/Transfusion - N/A Personal Hx/Components of Metabolic Syndrome - Possible MGM with liver disease  During his rehabilitation/LTAC.  He has had significant improvement in his lower extremity as well as peripheral edema.  However he continues to be very weak and tired.  His lower extremities are being wrapped in Unna boots.  At time of clinic visit today he is alert and oriented to person place and situation.  Patient is described as having a good appetite well.  The patient and family state he is never been told underlying liver disease during his recent hospitalization while his liver tests were found to be abnormal.  But the family recalls that all testing per [facility] was felt to not show liver disease problems as a cause for his underlying bilirubin elevation.  GI Review of Systems Positive as above Negative for dysphasia, bloating, melena, hematochezia  Review of Systems General: Denies fevers/chills HEENT: Denies oral lesions Cardiovascular: Denies current chest pain/palpitations Pulmonary: Shortness of breath is long-standing and unchanged Gastroenterological: See HPI Genitourinary: Denies darkened urine Hematological: Positive for easy bruising/bleeding Endocrine: Denies temperature intolerance Dermatological: Denies jaundice but is been told he looks pale Psychological: Mood is concerned and downhearted Musculoskeletal: Positive for new arthralgias   Medications Current Outpatient Medications  Medication Sig Dispense Refill  . atorvastatin (LIPITOR) 40 MG tablet TAKE 1 TABLET (40 MG TOTAL) BY MOUTH DAILY AT 6 PM. 30 tablet 5  . cholecalciferol (VITAMIN D) 1000 units tablet Take 2,000 Units by mouth daily.    . ferrous sulfate 325 (65  FE) MG tablet Take 325 mg by mouth 2 (two) times daily with a meal.    . fluticasone (FLONASE) 50 MCG/ACT nasal spray Place 1 spray into both nostrils daily.    . folic acid (FOLVITE) 1 MG tablet Take 1 tablet by mouth daily at 2 PM.  0  . furosemide (LASIX) 40 MG tablet Take 1 PO QAM and at noon (Patient taking differently: Take 80 mg by mouth. Take 1 tablet each morning, and 1/2 tablet at 4pm) 60 tablet 0  . gabapentin (NEURONTIN) 300 MG capsule TAKE 2 CAPSULES (600 MG TOTAL) BY MOUTH 2 (TWO) TIMES DAILY. 120 capsule 0  . lactulose (CHRONULAC) 10 GM/15ML solution Take by mouth 3 (three) times daily.    . Melatonin 5 MG TABS Take 1 tablet (5 mg total) by mouth at bedtime. 90 tablet 1  . Multiple Vitamins-Minerals (CENTRUM SILVER 50+MEN PO) Take 1 tablet by mouth daily.    . prednisoLONE acetate (PRED FORTE) 1 % ophthalmic suspension Place 1 drop into the left eye 4 (four) times daily.    . sertraline (ZOLOFT) 50 MG tablet Take 50 mg by mouth daily.  3  . thiamine 100 MG tablet Take 100 mg by mouth daily.    . traZODone (DESYREL) 150 MG  tablet Take 150 mg by mouth at bedtime.  3   No current facility-administered medications for this visit.     Allergies No Known Allergies  Histories Past Medical History:  Diagnosis Date  . Abnormal CXR (chest x-ray)   . Agoraphobia   . Anxiety   . Bipolar disorder ([place])   . Chronic low back pain   . Coagulopathy ([place])   . COPD (chronic obstructive pulmonary disease) ([place])   . Depression   . Depression   . Hemophilia ([place])   . Insomnia   . Osteopenia   . Peripheral edema   . Peripheral neuropathy   . Peripheral neuropathy   . Vitamin D deficiency    Past Surgical History:  Procedure Laterality Date  . CATARACT EXTRACTION  05/29/2017  . CATARACT EXTRACTION W/PHACO Left 06/13/2017   Procedure: CATARACT EXTRACTION PHACO AND INTRAOCULAR LENS PLACEMENT LEFT EYE;  Surgeon: Baruch Goldmann, MD;  Location: [facility];  Service: Ophthalmology;  Laterality:  Left;  CDE: 23.32   Social History   Socioeconomic History  . Marital status: Legally Separated    Spouse name: Not on file  . Number of children: Not on file  . Years of education: Not on file  . Highest education level: Not on file  Occupational History  . Not on file  Social Needs  . Financial resource strain: Not on file  . Food insecurity:    Worry: Not on file    Inability: Not on file  . Transportation needs:    Medical: Not on file    Non-medical: Not on file  Tobacco Use  . Smoking status: Former Smoker    Packs/day: 0.50    Years: 15.00    Pack years: 7.50    Types: Cigarettes    Last attempt to quit: 06/26/2017    Years since quitting: 0.2  . Smokeless tobacco: Never Used  Substance and Sexual Activity  . Alcohol use: No  . Drug use: No  . Sexual activity: Not Currently    Birth control/protection: None  Lifestyle  . Physical activity:    Days per week: Not on file    Minutes per session: Not on file  . Stress: Not on file  Relationships  . Social connections:    Talks on phone: Not on file    Gets together: Not on file    Attends religious service: Not on file    Active member of club or organization: Not on file    Attends meetings of clubs or organizations: Not on file    Relationship status: Not on file  . Intimate partner violence:    Fear of current or ex partner: Not on file    Emotionally abused: Not on file    Physically abused: Not on file    Forced sexual activity: Not on file  Other Topics Concern  . Not on file  Social History Narrative  . Not on file   Family History  Problem Relation Age of Onset  . Stroke Mother   . Colon cancer Neg Hx   . Esophageal cancer Neg Hx   . Liver disease Neg Hx   . Pancreatic cancer Neg Hx   . Stomach cancer Neg Hx    I have reviewed his medical, social, and family history in detail and updated the electronic medical record as necessary.    PHYSICAL EXAMINATION  BP (!) 110/52   Pulse 60   Ht  5' 11" (1.803 m)   [facility]  192 lb 4 oz (87.2 kg)   BMI 26.81 kg/m  Wt Readings from Last 3 Encounters:  09/19/17 192 lb 4 oz (87.2 kg)  08/28/17 231 lb (104.8 kg)  08/18/17 231 lb 8 oz (105 kg)  GEN: NAD, appears very chronically ill PSYCH: Cooperative EYE: Pale conjunctive a and possible yellowing of sclera in the lower eyelids ENT: Dry mucous membranes NECK: Supple CV: RR without R/Gs  RESP: Decreased breath sounds at the bases bilaterally GI: NABS, soft, NT/ND, without rebound or guarding, due to patient being in wheelchair and unable to move onto the patient table could not accurately confirmed or not confirmed hepatosplenomegaly MSK/EXT: Lower extremity edema is present with the Unna boots in place/significant wrapping SKIN: No overt jaundice NEURO: Positive for asterixis; no evidence of clonus or other focal deficits    REVIEW OF DATA  I reviewed the following data at the time of this encounter:  GI Procedures and Studies  None to review  Laboratory Studies  9/12 labs potassium 3.0, carbon dioxide 42, creatinine 0.86  9/13 labs Potassium 3.9  8/30 labs Potassium 2.9, carbon dioxide 39, creatinine 0.86, hemoglobin 12.7, platelets 189,000  8/27 labs Total bili 1.9, AST/ALT 40/25, alk phos 148, TSH 3.67, vitamin B12 400  Imaging Studies  7/19 RUQUS Ascites Eval IMPRESSION: Small volume ascites insufficient for paracentesis. Markedly thickened abdominal wall with extensive subcutaneous and abdominal wall edema, wall measuring between 7 cm and 9 cm thick.  6/19 PET-CT 1.No hypermetabolic adenopathy to suggest aggressive lymphoma. PET/CT is limited in evaluation for indolent lymphoma. 2.Decreased size of the hematoma along the left chest wall.Mildly increased uptake within the right semitendinosus musculature and left lower quadrant abdominal wall, favored to relate to additional sites of contusion/hematoma.  6/19 Liver U/S 1.Probable small gallstone. No  gallbladder distention or bile duct dilation. 2.Hepatomegaly. 3.Small volume free intraperitoneal fluid is visualized. 4.Partially imaged right pleural effusion.  6/19 CT-CAP w/wout Contrast FINDINGS: ABDOMEN AND PELVIS: Liver/biliary: Subtle nonspecific irregularity of the liver surface along the left hepatic lobe with mild caudate lobe hypertrophy. Limited evaluation for focal liver lesions on this examination not optimally timed for parenchymal enhancement. No biliary dilatation. Gallbladder is contracted. Spleen: The spleen is mildly enlarged and measures 14 cm in length. Pancreas: Mild fat stranding around the pancreas extending into the root of the mesentery. No focal pancreatic lesion or pancreatic ductal dilatation identified. No pancreatic calcifications. Adrenals: Within normal limits. Kidneys: The kidneys are normal in size. No renal stones or hydronephrosis. No renal mass identified. GI: Normal caliber small bowel without obstruction. Normal appendix. No colonic wall thickening or inflammatory change. Minimal colonic diverticulosis. Peritoneum/mesentery/extraperitoneum: Several subcentimeter retroperitoneal lymph nodes and subcentimeter mesenteric lymph nodes which are nonspecific. Mild retroperitoneal fat stranding. No free intraperitoneal fluid or gas. Skin thickening and subcutaneous fat stranding in the pannus. Moderate size fat-containing umbilical hernia. Vascular: Moderate atherosclerotic plaque in a normal caliber abdominal aorta. Bladder/reproductive: The bladder is normal in size and contour. No significant prostate enlargement. Pelvic nodes: Numerous pelvic lymph nodes with multiple enlarged pelvic lymph nodes. For reference, the right pelvic lymph node along the external iliac vessels is enlarged and measures 4.3 x 1.8 cm and there are bilateral enlarged inguinal lymph nodes measuring 3.3 x 1.2 cm on the right and 3.1 x 1.6 cm on the left. MSK: No displaced rib  fracture. Moderate degenerative disc disease at L5-S1. No suspicious osseous lesion.  4/19 RUQUS IMPRESSION: Small amount of ascites. Small RIGHT pleural effusion. Question subtle irregularity/nodularity  of hepatic contours, without discrete hepatic mass lesion seen. Cannot completely exclude cirrhosis; consider follow up CT imaging with contrast for further evaluation.   ASSESSMENT  Mr. Vitali is a 67 y.o. 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   The patient is seen today for evaluation and management of:  1. Abnormal LFTs   2. Abnormal [place] (ultrasound) of abdomen   3. Hepatomegaly   4. Blood alkaline phosphatase increased compared with prior measurement    This is a very complex individual who comes with minimal records for consideration of whether his underlying anasarca/lower extremity edema is a result of liver disease.  As documented in his care everywhere notes and in a note that was sent from his skilled nursing facility he was recently hospitalized this summer with increased weight gain and anasarca.  At that time it was thought he had a heart failure as a result of potential constrictive physiology but was not felt to be a candidate for a pericardiectomy.  He was seen by a cardiologist in the course the last few weeks and was told that his liver was failing and that he needed to be further evaluated as soon as able.  With that being said we do not have records of that actual referral in our system and we will work on trying to obtain those.  The patient has had a chronically elevated alkaline phosphatase in the [facility] epic system going back to 2018.  I am not convinced that all of his issues with anasarca are a result of his liver.  With that being said there is no doubt that he did have some liver biochemical test abnormalities.   Based on imaging that was performed at [facility] he had some hepatomegaly but no evidence of a cirrhotic/nodular liver.  He has normal thrombocyte count.  His INR has also been normal.  Could he have underlying liver disease?  Yes, it is possible.  I do not have access to his most recent echo that suggested by his cardiologist that he no longer had constrictive physiology.  He had labs drawn at his skilled nursing facility yesterday and had a potassium of 3 and is having a adjustment of his medications including potassium done over the course of this weekend.  I think it would be reasonable to consider the addition of a potassium sparing diuretic such as spironolactone if there is no contraindication but I will defer that to his physicians including his cardiologist and primary care doctor and current doctor at the skilled nursing facility to ensure that they monitor his labs closely if they choose to decide to add something like that on.  We will begin a more in-depth evaluation of his liver based on liver biochemical evaluation.  We will consider the role of a CT scan of his liver to evaluate for evidence of portal hypertension.  He does have a slight iron deficiency and we could consider in the future evaluation of this based on his overall clinical stability with endoscopic evaluation if indicated in the future.  I discussed with the patient as well as with the patient's 2 sisters that this will take a few weeks for [place] to try and come together whether his liver is playing a role but we will work together.  A liver biopsy may be something we need to consider in the future but not at this  moment.  At this moment I would continue his lactulose as he has been given for constipation but it is not clear to me that it is for treatment of possible portosystemic encephalopathy.  If they want to continue the lactulose to make sure he has 2-3 bowel movements per day I think that is reasonable and they can adjust that  dosing from 15 mL up to 30 mL every 4-6 hours until he has 2-3 bowel movements per day that usually requires twice daily or 3 times daily dosing.  We will try to obtain his cardiology records as well for evaluation.  All questions were answered to the best my ability today and the patient as well as sisters agree with this plan of action.  We will plan on a follow-up in the next 3 to 4 weeks.  I have written recommendations and considerations for the current skilled nursing facility and I am happy to discuss with them in the future with regards to medications or adjustments or further evaluation that may be required.   PLAN  1. Abnormal LFTs - Protime-INR; Future - Hepatic function panel; Future - Bilirubin, Direct; Future - B Nat Peptide; Future - Alkaline phosphatase; Future - Amylase isoenzymes; Future - Tissue transglutaminase, IgA; Future - IgA; Future - Gamma GT; Future - Mitochondrial antibodies; Future - TSH; Future - Cortisol; Future - B Nat Peptide; Future - Hepatitis B surface antibody,qualitative; Future  2. Abnormal [place] (ultrasound) of abdomen  3. Hepatomegaly  4. Blood alkaline phosphatase increased compared with prior measurement  5. Anasarca   Orders Placed This Encounter  Procedures  . Protime-INR  . Hepatic function panel  . Bilirubin, Direct  . B Nat Peptide  . Alkaline phosphatase  . Amylase isoenzymes  . Tissue transglutaminase, IgA  . IgA  . Gamma GT  . Mitochondrial antibodies  . TSH  . Cortisol  . B Nat Peptide  . Hepatitis B surface antibody,qualitative    New Prescriptions   No medications on file   Modified Medications   No medications on file    Planned Follow Up: No follow-ups on file.   Justice Britain, MD [facility] Office # [phone]

## 2017-09-19 NOTE — Patient Instructions (Addendum)
Thank you for coming today. In summary, we are planning on doing some laboratory workup to try and better understand if you do have more singificnat liver scarring/possible cirhrosis that could be playing a role with some of your symptoms. Labs will be drawn today. After these return, we may need to consider a cross-sectional scan or CT-Abdomen as long as your kidney function allows [place] to do this, to get a better view of the liver. We may need a liver biopsy at some point in the future but not now. Even if this is liver playing a role, we would work on symptom control as you are doing with medication adjustment by PCP and Cardiology based on your labs  Your provider has requested that you go to the basement level for lab work before leaving today. Press "B" on the elevator. The lab is located at the first door on the left as you exit the elevator.  We have scheduled you for a follow up appointment with Dr Rush Landmark on 10/17/17 at 945

## 2017-09-21 ENCOUNTER — Encounter: Payer: Self-pay | Admitting: Gastroenterology

## 2017-09-21 DIAGNOSIS — R7989 Other specified abnormal findings of blood chemistry: Secondary | ICD-10-CM | POA: Insufficient documentation

## 2017-09-21 DIAGNOSIS — R748 Abnormal levels of other serum enzymes: Secondary | ICD-10-CM | POA: Insufficient documentation

## 2017-09-21 DIAGNOSIS — R160 Hepatomegaly, not elsewhere classified: Secondary | ICD-10-CM | POA: Insufficient documentation

## 2017-09-21 DIAGNOSIS — R935 Abnormal findings on diagnostic imaging of other abdominal regions, including retroperitoneum: Secondary | ICD-10-CM | POA: Insufficient documentation

## 2017-09-21 DIAGNOSIS — R945 Abnormal results of liver function studies: Secondary | ICD-10-CM | POA: Insufficient documentation

## 2017-09-22 ENCOUNTER — Other Ambulatory Visit: Payer: Self-pay | Admitting: Family Medicine

## 2017-09-23 ENCOUNTER — Encounter: Payer: Self-pay | Admitting: Gastroenterology

## 2017-09-23 ENCOUNTER — Other Ambulatory Visit: Payer: Self-pay | Admitting: Family Medicine

## 2017-09-23 NOTE — Progress Notes (Signed)
Review of records obtained through fax August 2019 cardiology record from [facility] This was a clinic visit where they discussed his past medical history as well as his clinical symptoms of abdominal swelling and leg swelling where he states an extensive work-up have been performed at [facility] in [place] but no records were available for him to review.  He also stated that there was a concern for a cardiac tumor but not felt to be a surgical candidate at [facility] but he did not have records to review.  At the end of the clinic visit note he has an assessment for anemia, congestive heart failure, hyperlipidemia, obesity.  In regards to assessment just a few sentences including questionable cirrhosis of liver, volume overload, questionable cardiac tumor Plan was for him to obtain records and increase his diuretics of Lasix to 80 mg in the morning and 40 mg in the afternoon as well as addition of potassium supplementation. No other records or notations were obtained and is not clear in this particular note that the provider felt he needed an urgent liver work-up/evaluation but no other records are available to review. I will have these records scanned into the system.

## 2017-09-24 ENCOUNTER — Telehealth: Payer: Self-pay | Admitting: Gastroenterology

## 2017-09-24 LAB — HEPATITIS B SURFACE ANTIBODY,QUALITATIVE: Hep B S Ab: NONREACTIVE

## 2017-09-24 LAB — MITOCHONDRIAL ANTIBODIES: Mitochondrial M2 Ab, IgG: <=20 U

## 2017-09-24 LAB — TISSUE TRANSGLUTAMINASE, IGA: (TTG) AB, IGA: 1 U/mL

## 2017-09-24 LAB — AMYLASE ISOENZYMES
Amylase: 28 U/L (ref 21–101)
ISOAMYLASE-PANCREATIC: 22 U/L (ref 16–46)
SALIVARY FRACTION: 6 U/L (ref 4–61)

## 2017-09-24 NOTE — Telephone Encounter (Signed)
Dr Rush Landmark have you reviewed the patients labs?

## 2017-09-24 NOTE — Telephone Encounter (Signed)
Pt's sister Inez Catalina calling regarding lab results.

## 2017-09-24 NOTE — Telephone Encounter (Signed)
Please see result note attached to labs. Thank you.

## 2017-09-30 ENCOUNTER — Telehealth: Payer: Self-pay | Admitting: Gastroenterology

## 2017-09-30 NOTE — Telephone Encounter (Signed)
The labs have been faxed to the number provided

## 2017-10-17 ENCOUNTER — Encounter: Payer: Self-pay | Admitting: Gastroenterology

## 2017-10-17 ENCOUNTER — Ambulatory Visit (INDEPENDENT_AMBULATORY_CARE_PROVIDER_SITE_OTHER): Payer: 59 | Admitting: Gastroenterology

## 2017-10-17 ENCOUNTER — Other Ambulatory Visit (INDEPENDENT_AMBULATORY_CARE_PROVIDER_SITE_OTHER): Payer: 59

## 2017-10-17 VITALS — BP 106/56 | HR 60 | Ht 71.0 in | Wt 219.0 lb

## 2017-10-17 DIAGNOSIS — R601 Generalized edema: Secondary | ICD-10-CM | POA: Diagnosis not present

## 2017-10-17 DIAGNOSIS — R945 Abnormal results of liver function studies: Secondary | ICD-10-CM

## 2017-10-17 DIAGNOSIS — R7989 Other specified abnormal findings of blood chemistry: Secondary | ICD-10-CM

## 2017-10-17 LAB — PROTIME-INR
INR: 1.3 ratio — AB (ref 0.8–1.0)
Prothrombin Time: 15.3 s — ABNORMAL HIGH (ref 9.6–13.1)

## 2017-10-17 LAB — HEPATIC FUNCTION PANEL
ALT: 22 U/L (ref 0–53)
AST: 35 U/L (ref 0–37)
Albumin: 2.9 g/dL — ABNORMAL LOW (ref 3.5–5.2)
Alkaline Phosphatase: 164 U/L — ABNORMAL HIGH (ref 39–117)
BILIRUBIN DIRECT: 0.7 mg/dL — AB (ref 0.0–0.3)
BILIRUBIN TOTAL: 1.5 mg/dL — AB (ref 0.2–1.2)
Total Protein: 7.2 g/dL (ref 6.0–8.3)

## 2017-10-17 LAB — BASIC METABOLIC PANEL
BUN: 11 mg/dL (ref 6–23)
CALCIUM: 9.1 mg/dL (ref 8.4–10.5)
CO2: 36 meq/L — AB (ref 19–32)
Chloride: 96 mEq/L (ref 96–112)
Creatinine, Ser: 0.85 mg/dL (ref 0.40–1.50)
GFR: 95.87 mL/min (ref >60.00)
GLUCOSE: 84 mg/dL (ref 70–99)
POTASSIUM: 4.4 meq/L (ref 3.5–5.1)
Sodium: 137 mEq/L (ref 135–145)

## 2017-10-17 LAB — PHOSPHORUS: Phosphorus: 3.5 mg/dL (ref 2.3–4.6)

## 2017-10-17 LAB — MAGNESIUM: Magnesium: 2.3 mg/dL (ref 1.5–2.5)

## 2017-10-17 NOTE — Patient Instructions (Addendum)
You have been scheduled for a CT scan of the abdomen and pelvis at [facility] ([address]---this is in the same building as Press [facility]).   You are scheduled on 10/30/17 at 2pm. You should arrive 15 minutes prior to your appointment time for registration. Please follow the written instructions below on the day of your exam:  WARNING: IF YOU ARE ALLERGIC TO IODINE/X-RAY DYE, PLEASE NOTIFY RADIOLOGY IMMEDIATELY AT [phone]! YOU WILL BE GIVEN A 13 HOUR PREMEDICATION PREP.  1) Do not eat or drink anything after 10am (4 hours prior to your test) 2) You have been given 2 bottles of oral contrast to drink. The solution may taste better if refrigerated, but do NOT add ice or any other liquid to this solution. Shake well before drinking.    Drink 1 bottle of contrast @ 12pm (2 hours prior to your exam)  Drink 1 bottle of contrast @ 1pm (1 hour prior to your exam)  You may take any medications as prescribed with a small amount of water, if necessary. If you take any of the following medications: METFORMIN, GLUCOPHAGE, GLUCOVANCE, AVANDAMET, RIOMET, FORTAMET, [place] MET, JANUMET, GLUMETZA or METAGLIP, you MAY be asked to HOLD this medication 48 hours AFTER the exam.  The purpose of you drinking the oral contrast is to aid in the visualization of your intestinal tract. The contrast solution may cause some diarrhea. Depending on your individual set of symptoms, you may also receive an intravenous injection of x-ray contrast/dye. Plan on being at [facility] for 30 minutes or longer, depending on the type of exam you are having performed.  This test typically takes 30-45 minutes to complete.  If you have any questions regarding your exam or if you need to reschedule, you may call the CT department at [phone] between the hours of 8:00 am and 5:00 pm, Monday-Friday.  _______________________________________________________________________  Your provider has requested  that you go to the basement level for lab work before leaving today. Press "B" on the elevator. The lab is located at the first door on the left as you exit the elevator.  We have scheduled you for a follow up appointment with  Tye Savoy on 11/06/17 at [facility] _ Thank you for entrusting me with your care and choosing [facility] care.  Dr Rush Landmark

## 2017-10-17 NOTE — Progress Notes (Signed)
[facility] VISIT   Primary Care Provider Dettinger, Fransisca Kaufmann, MD [place] [address] [phone]  Referring Provider Dettinger, Fransisca Kaufmann, MD [address] [place], [place] [address] [phone]  Patient Profile: Eduin Friedel is a 67 y.o. 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  The patient presents to the [facility] for an evaluation and management of problem(s) noted below:  Problem List 1. Abnormal LFTs   2. Anasarca     History of Present Illness: Please see prior GI consultation note for full details of initial history of present illness.  In brief, he was referred by his new cardiologist because of concern for developing anasarca lower extremity edema.  He has been living in a nursing facility/long-term acute care rehab since his recent admissions in the summer to [facility].  While he is been at [facility] in the summer he at one point had an abnormal bilirubin with slight elevations in his transferases and was felt during that hospital stay to have issues as a result of potential constrictive pericarditis.  There had one point in time been potential consideration of a pericardial window being created versus pericardial section however this was not felt to be in the patient's best interest and he was eventually discharged on diuretics to his facility.  He unfortunately has just been living there and having waxing/waning symptoms of lower extremity edema.  While he was at [facility] they told him that his liver was fine and that this was not a result of liver disease at one point in time there has been a concern for a low-grade lymphoma versus lymphoproliferative process and he has known hemophilia.  Because of elevations in his ammonia he has been started  on lactulose when he had waxing/waning mental status though not clear if this was really playing a role or not.  He really did not have significant risk factors for underlying liver disease other than social alcohol consumption and a paternal grandmother had underlying liver disease of an unclear etiology.  When he was last seen at his clinic visit had significant improvement in his lower extremity peripheral edema we did a basic laboratory work-up to evaluate for potential etiology of liver disease.  During that being covered that he had a slight INR elevation at which point we gave him vitamin K for period of time.  His bilirubin elevation was have conjugated and unconjugated.  He had an elevation in his BNP to where he was previously when he was in the system and we consider the role of a possible CT cirrhosis protocol to evaluate the liver prior hematoma and ensure that there was no further hematoma that was present.  Interval History: The patient and his family have return and bring the patient's recent labs as well as admission record from [facility] and rehab center.  Patient's primary physician at the Center is Dr. Trevor Iha B. Patel.  The patient's weights have been recorded recently and have unfortunately been increasing.  On October 2 his weight was 206.8 pounds.  On October 3 his weight was 207.1 pounds.  On October 4 his weight was 209 pounds.  On October 9 his weight was 212 pounds.  On October 10 his weight was 213.6 pounds.  Based on his MAR that was given it seems that his prior dosing of  diuretics were Lasix 60 mg every morning and Lasix 40 mg in the evening as well as spironolactone were discontinued on October 2.  I am not sure why this would be the case.  It seems that he was then initiated on Lasix 60 mg orally twice daily and remained on 80 mEq of potassium twice daily.  We will have his records scanned into the chart.  At this point in time it is apparent that his lower extremity  edema has been increasing significantly.  Seems that they are adjusting diuretics somewhat at his facility.  He also is describing increasing abdominal distention with concern for the possibility of underlying fluid collection in his abdomen though this is not been confirmed.  From a mental status perspective he seems to be better but still has occasional episodes of waxing/waning effusion.  Today on his evaluation he is very clear and understands what is going on and is hopeful of finding an answer.  He and family are wondering about a liver biopsy as I have previously described as a possibility in the future.  There is no overt jaundice not had any significant persistence or dark and urine for days.  His labs are noted below from recent liver tests done at the end of September.  GI Review of Systems Positive as above Negative for nausea, vomiting, abdominal pain, early satiety, jaundice, melena, hematochezia  Review of Systems General: Denies fevers/chills Cardiovascular: Denies chest pain/palpitations Pulmonary: Shortness of breath is stable Gastroenterological: See HPI Genitourinary: Denies darkened urine for extended periods of time Hematological: Positive for easy bruising/bleeding Dermatological: Denies jaundice but is been told he looks pale Psychological: Mood is concerned and downhearted Musculoskeletal: Positive for arthralgias still being present   Medications Current Outpatient Medications  Medication Sig Dispense Refill  . atorvastatin (LIPITOR) 40 MG tablet TAKE 1 TABLET (40 MG TOTAL) BY MOUTH DAILY AT 6 PM. 30 tablet 5  . cholecalciferol (VITAMIN D) 1000 units tablet Take 2,000 Units by mouth daily.    . ferrous sulfate 325 (65 FE) MG tablet Take 325 mg by mouth 2 (two) times daily with a meal.    . fluticasone (FLONASE) 50 MCG/ACT nasal spray Place 1 spray into both nostrils daily.    . folic acid (FOLVITE) 1 MG tablet Take 1 tablet by mouth daily at 2 PM.  0  . furosemide  (LASIX) 40 MG tablet Take 1 PO QAM and at noon (Patient taking differently: Take 60 mg by mouth 2 (two) times daily. ) 60 tablet 0  . gabapentin (NEURONTIN) 300 MG capsule TAKE 2 CAPSULES (600 MG TOTAL) BY MOUTH 2 (TWO) TIMES DAILY. 120 capsule 0  . lactulose (CHRONULAC) 10 GM/15ML solution Take by mouth 3 (three) times daily.    . Multiple Vitamins-Minerals (CENTRUM SILVER 50+MEN PO) Take 1 tablet by mouth daily.    . Potassium Chloride ER 20 MEQ TBCR Take 80 mg by mouth 2 (two) times daily.    . prednisoLONE acetate (PRED FORTE) 1 % ophthalmic suspension Place 1 drop into the left eye 4 (four) times daily.    . sertraline (ZOLOFT) 50 MG tablet Take 50 mg by mouth daily.  3  . thiamine 100 MG tablet Take 100 mg by mouth daily.    . traZODone (DESYREL) 150 MG tablet Take 150 mg by mouth at bedtime.  3  . VENTOLIN HFA 108 (90 Base) MCG/ACT inhaler Please specify directions, refills and quantity 1 Inhaler 4   No current facility-administered medications for  this visit.     Allergies No Known Allergies  Histories Past Medical History:  Diagnosis Date  . Abnormal CXR (chest x-ray)   . Agoraphobia   . Anxiety   . Bipolar disorder ([place])   . Chronic low back pain   . Coagulopathy ([place])   . COPD (chronic obstructive pulmonary disease) ([place])   . Depression   . Depression   . Hemophilia ([place])   . Insomnia   . Osteopenia   . Peripheral edema   . Peripheral neuropathy   . Peripheral neuropathy   . Vitamin D deficiency    Past Surgical History:  Procedure Laterality Date  . CATARACT EXTRACTION  05/29/2017  . CATARACT EXTRACTION W/PHACO Left 06/13/2017   Procedure: CATARACT EXTRACTION PHACO AND INTRAOCULAR LENS PLACEMENT LEFT EYE;  Surgeon: Baruch Goldmann, MD;  Location: [facility];  Service: Ophthalmology;  Laterality: Left;  CDE: 23.32   Social History   Socioeconomic History  . Marital status: Legally Separated    Spouse name: Not on file  . Number of children: Not on file  . Years of  education: Not on file  . Highest education level: Not on file  Occupational History  . Not on file  Social Needs  . Financial resource strain: Not on file  . Food insecurity:    Worry: Not on file    Inability: Not on file  . Transportation needs:    Medical: Not on file    Non-medical: Not on file  Tobacco Use  . Smoking status: Former Smoker    Packs/day: 0.50    Years: 15.00    Pack years: 7.50    Types: Cigarettes    Last attempt to quit: 06/26/2017    Years since quitting: 0.3  . Smokeless tobacco: Never Used  Substance and Sexual Activity  . Alcohol use: No  . Drug use: No  . Sexual activity: Not Currently    Birth control/protection: None  Lifestyle  . Physical activity:    Days per week: Not on file    Minutes per session: Not on file  . Stress: Not on file  Relationships  . Social connections:    Talks on phone: Not on file    Gets together: Not on file    Attends religious service: Not on file    Active member of club or organization: Not on file    Attends meetings of clubs or organizations: Not on file    Relationship status: Not on file  . Intimate partner violence:    Fear of current or ex partner: Not on file    Emotionally abused: Not on file    Physically abused: Not on file    Forced sexual activity: Not on file  Other Topics Concern  . Not on file  Social History Narrative  . Not on file   Family History  Problem Relation Age of Onset  . Stroke Mother   . Colon cancer Neg Hx   . Esophageal cancer Neg Hx   . Liver disease Neg Hx   . Pancreatic cancer Neg Hx   . Stomach cancer Neg Hx    I have reviewed his medical, social, and family history in detail and updated the electronic medical record as necessary.    PHYSICAL EXAMINATION  BP (!) 106/56   Pulse 60   Ht 5\' 11"  (1.803 m)   Wt 219 lb (99.3 kg)   BMI 30.54 kg/m  Wt Readings from Last 3 Encounters:  10/17/17  219 lb (99.3 kg)  09/19/17 192 lb 4 oz (87.2 kg)  08/28/17 231 lb  (104.8 kg)  GEN: NAD, appears chronically ill, accompanied by 2 sisters PSYCH: Cooperative EYE: Pale conjunctive, anicteric ENT: Moist mucous membranes NECK: Supple CV: RR without R/Gs  RESP: Decreased breath sounds at the bases bilaterally GI: NABS, soft, protuberant abdomen, nondistended NT, without rebound or guarding, patient in wheelchair and unable to be moved MSK/EXT: Lower extremity edema is present with increased wrapping on both lower extremities with some weeping of fluid being noted SKIN: No overt jaundice NEURO: Negative for asterixis or clonus, alert and oriented to person/place/situation    REVIEW OF DATA  I reviewed the following data at the time of this encounter:  GI Procedures and Studies  None to review  Laboratory Studies  October 02, 2017 labs Hepatitis A total nonreactive INR 1.25 Creatinine 0.8 Sodium 135 Potassium 4.4 AST/ALT 38/25 Alkaline phosphatase 158 Total bili 1.7 (0.9 direct)  Imaging Studies  No new imaging since initial clinic visit   ASSESSMENT  Mr. Sedeno is a 67 y.o. 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   The patient is seen today for evaluation and management of:  1. Abnormal LFTs   2. Anasarca    Patient has progressive anasarca and may have increasing development of ascites with them.  Would like to get a CT scan to further evaluate if he does have ascites versus if this is all lower extremity edema.  If he has ascites we would then plan for him to have fluid studies to analyze this and confirm a high/low SAAG that would help guide [place] in regards to his therapy and management diagnostically.  We will proceed with a cross-sectional cirrhosis protocol CT scan to evaluate for the contour of his liver and evaluate for any fluid in his abdomen.  We discussed that a liver biopsy via  transjugular approach with portal pressures may be required based on how he is doing.  However the setting of his underlying diagnosis of potential hemophilia I would not proceed with a transjugular biopsy or liver biopsy give [place] a okay as to whether the patient may need factors because of concern for the development of a hematoma or potential complication.  Thus we will see his CT scan to see his liver tests and make a decision from there and if necessary he should see his hematologist or oncologist to further evaluate whether he would need factor before any sort of liver biopsy.  I have recommended the consideration of re-adding spironolactone based on what which included BMP/mag/Foss show today.  I think having both diuretics on board would be reasonable.  If he is not tolerating oral Lasix then transition to torsemide or Bumex as appropriate conversion from Lasix may be reasonable.  However tolerated addition of spironolactone twice daily he may benefit from this as well.  The patient as well as the patient's to sister describe his cardiologist but wanting to potentially do a cardiac MRI.  If there is concern need for further evaluation from a heart perspective which I still believe is the more likely etiology of patient's anasarca that this should be followed.  They are going to reach out to Dr. who wanted to see which cardiologist they would recommend to consider further work-up versus if a referral from [place] to call health cardiology will be necessary.  I  think this is critical because if there is any sense that this remains cardiac in etiology that should be worked out before we put him through essentially dangerous or invasive testing with liver biopsies or transjugular pressures.  I have written recommendations for the skilled nursing facility.  I am happy to talk with Dr. Posey Pronto about the patient's case if necessary.  Arm note will be sent to Dr. Carlean Jews as well upon completion.  We will try to obtain Dr.  Zenia Resides cardiology follow-up note.  He is going to be further diuretic titration and this will be managed by Dr. Posey Pronto but happy to discuss situations and thought since he will be able to get labs more frequently as necessary to monitor though I suspect his potassium will be able to be decreased if we are able to start him back on Spironolactone.  All patient questions were answered, to the best of my ability, and the patient agrees to the aforementioned plan of action with follow-up as indicated.   PLAN  1. Abnormal LFTs - Alkaline Phosphatase, Isoenzymes - Basic metabolic panel; Future - Hepatic function panel; Future - Magnesium; Future - Phosphorus; Future - Protime-INR; Future - Hepatitis A antibody, total; Future - Holding on Liver biopsy due to invasiveness as well as reported history of Hemophilia and the need for possible factor infusion for this if it were to be considered via transjugular vs percutaneous approach (though transjugular would be route we would consider first)  2. Anasarca - CT ABDOMEN PELVIS W WO CONTRAST; Future - If evidence of abdominal ascites, then would be most ideal to try and obtain a diagnostic sample to calculate SAAG (low v high to rule in/out portal hypertension vs Cardiac) - Based on BMP/Mg/Phos/Cr would consider restarting Spironolactone 50 mg once to twice daily in addition to his Lasix dosing - Patient and family to reach out to Dr. Terrence Dupont and see if they really need a Cardiac MRI or further workup that it be completed, since our concern is that this is primary component   Orders Placed This Encounter  Procedures  . CT ABDOMEN PELVIS W WO CONTRAST  . Alkaline Phosphatase, Isoenzymes  . Basic metabolic panel  . Hepatic function panel  . Magnesium  . Phosphorus  . Protime-INR  . Hepatitis A antibody, total    New Prescriptions   No medications on file   Modified Medications   No medications on file    Planned Follow Up: No follow-ups  on file.   Justice Britain, MD [facility] Office # [phone]

## 2017-10-18 LAB — HEPATITIS A ANTIBODY, TOTAL: Hepatitis A AB,Total: NONREACTIVE

## 2017-10-20 ENCOUNTER — Encounter: Payer: Self-pay | Admitting: Gastroenterology

## 2017-10-20 ENCOUNTER — Telehealth: Payer: Self-pay

## 2017-10-20 NOTE — Progress Notes (Signed)
Last weeks note and labs faxed over to Dr Posey Pronto at the [facility]. Spoke to [employer] who will also provide an updated medication list for him to review

## 2017-10-20 NOTE — Telephone Encounter (Signed)
-----   Message from Irving Copas., MD sent at 10/20/2017  5:25 AM EDT ----- Regarding: Need an updated [facility] Dear Kent Bryant, Can we reach out to patient's facility at [facility] and find out and get a most current Medication Administration Record [facility]) is for him as it was not available at time of his clinic visit? Thank you Chester Holstein

## 2017-10-20 NOTE — Telephone Encounter (Signed)
Kent Bryant is working on getting records from the facility.

## 2017-10-22 ENCOUNTER — Encounter: Payer: Self-pay | Admitting: Gastroenterology

## 2017-10-22 NOTE — Progress Notes (Signed)
Review of records  Cardiology note from [facility] This is from October 08, 2017 At time of visit there had been no worsening of his shortness of breath but his lower extremity edema had been increasing.  They noted a decrease in Lasix dosing.  There was suggestion and his plan to continue low-salt and low-cholesterol heart healthy diet.  He wanted Lasix to be increased to 6 mg twice daily he wanted to have labs checked.  Continue his medications otherwise.  I do not see where the patient or family had described before discussed with me that there has been no desire for a cardiac MRI as is not documented in the chart.  I have these records up dated into the system.   Justice Britain, MD [place] [facility] Office # [phone]

## 2017-10-30 ENCOUNTER — Ambulatory Visit (INDEPENDENT_AMBULATORY_CARE_PROVIDER_SITE_OTHER)
Admission: RE | Admit: 2017-10-30 | Discharge: 2017-10-30 | Disposition: A | Discharge status: Home / Self Care | Payer: 59 | Source: Ambulatory Visit | Attending: Gastroenterology | Admitting: Gastroenterology

## 2017-10-30 DIAGNOSIS — R945 Abnormal results of liver function studies: Secondary | ICD-10-CM | POA: Diagnosis not present

## 2017-10-30 DIAGNOSIS — R601 Generalized edema: Secondary | ICD-10-CM

## 2017-10-30 DIAGNOSIS — R7989 Other specified abnormal findings of blood chemistry: Secondary | ICD-10-CM

## 2017-10-30 MED ORDER — IOPAMIDOL (ISOVUE-300) INJECTION 61%
100.0000 mL | Freq: Once | INTRAVENOUS | Status: AC | PRN
  Administered 2017-10-30: 100 mL via INTRAVENOUS

## 2017-11-04 ENCOUNTER — Telehealth: Payer: Self-pay

## 2017-11-04 NOTE — Telephone Encounter (Signed)
Just discharged from hospital  Left leg red and weeping  They are wrapping  Has an appt Nov 7th  Do you think he needs to be seen sooner?

## 2017-11-05 ENCOUNTER — Telehealth: Payer: Self-pay

## 2017-11-05 ENCOUNTER — Ambulatory Visit (INDEPENDENT_AMBULATORY_CARE_PROVIDER_SITE_OTHER): Payer: 59 | Admitting: Family Medicine

## 2017-11-05 ENCOUNTER — Encounter: Payer: Self-pay | Admitting: Family Medicine

## 2017-11-05 ENCOUNTER — Telehealth: Payer: Self-pay | Admitting: Family Medicine

## 2017-11-05 VITALS — BP 99/59 | HR 72 | Temp 97.9°F | Ht 71.0 in | Wt 200.0 lb

## 2017-11-05 DIAGNOSIS — I3139 Other pericardial effusion (noninflammatory): Secondary | ICD-10-CM

## 2017-11-05 DIAGNOSIS — I313 Pericardial effusion (noninflammatory): Secondary | ICD-10-CM

## 2017-11-05 DIAGNOSIS — G609 Hereditary and idiopathic neuropathy, unspecified: Secondary | ICD-10-CM | POA: Diagnosis not present

## 2017-11-05 DIAGNOSIS — R600 Localized edema: Secondary | ICD-10-CM

## 2017-11-05 DIAGNOSIS — L03116 Cellulitis of left lower limb: Secondary | ICD-10-CM

## 2017-11-05 DIAGNOSIS — K766 Portal hypertension: Secondary | ICD-10-CM | POA: Diagnosis not present

## 2017-11-05 DIAGNOSIS — I319 Disease of pericardium, unspecified: Secondary | ICD-10-CM

## 2017-11-05 DIAGNOSIS — K7460 Unspecified cirrhosis of liver: Secondary | ICD-10-CM

## 2017-11-05 MED ORDER — LACTULOSE 10 GM/15ML PO SOLN: 10.0000 g | Freq: Three times a day (TID) | ORAL | 2 refills | Status: DC

## 2017-11-05 MED ORDER — POTASSIUM CHLORIDE ER 20 MEQ PO TBCR: 80.0000 meq | EXTENDED_RELEASE_TABLET | Freq: Two times a day (BID) | ORAL | 5 refills | Status: DC

## 2017-11-05 MED ORDER — POTASSIUM CHLORIDE ER 20 MEQ PO TBCR: 80.0000 mg | EXTENDED_RELEASE_TABLET | Freq: Two times a day (BID) | ORAL | 5 refills | Status: DC

## 2017-11-05 MED ORDER — CEPHALEXIN 500 MG PO CAPS: 500.0000 mg | ORAL_CAPSULE | Freq: Four times a day (QID) | ORAL | 0 refills | Status: DC

## 2017-11-05 MED ORDER — FUROSEMIDE 20 MG PO TABS: 60.0000 mg | ORAL_TABLET | Freq: Two times a day (BID) | ORAL | 5 refills | Status: DC

## 2017-11-05 MED ORDER — SERTRALINE HCL 50 MG PO TABS: 50.0000 mg | ORAL_TABLET | Freq: Every day | ORAL | 3 refills | Status: DC

## 2017-11-05 MED ORDER — ATORVASTATIN CALCIUM 40 MG PO TABS: 40.0000 mg | ORAL_TABLET | Freq: Every day | ORAL | 5 refills | Status: DC

## 2017-11-05 MED ORDER — GABAPENTIN 300 MG PO CAPS: 600.0000 mg | ORAL_CAPSULE | Freq: Two times a day (BID) | ORAL | 5 refills | Status: DC

## 2017-11-05 MED ORDER — FERROUS SULFATE 325 (65 FE) MG PO TABS: 325.0000 mg | ORAL_TABLET | Freq: Two times a day (BID) | ORAL | 5 refills | Status: DC

## 2017-11-05 MED ORDER — SPIRONOLACTONE 25 MG PO TABS: 25.0000 mg | ORAL_TABLET | Freq: Two times a day (BID) | ORAL | 5 refills | Status: DC

## 2017-11-05 NOTE — Telephone Encounter (Signed)
-----   Message from Dow Adolph sent at 11/05/2017 12:25 PM EDT ----- Pt said he is returning your call [phone]

## 2017-11-05 NOTE — Progress Notes (Signed)
 BP (!) 99/59   Pulse 72   Temp 97.9 F (36.6 C) (Oral)   Ht 5' 11" (1.803 m)   Wt 200 lb (90.7 kg)   BMI 27.89 kg/m    Subjective:    Patient ID: Kent Bryant, male    DOB: 10/27/1950, 66 y.o.   MRN: [id number]  HPI: Kent Bryant is a 66 y.o. male presenting on 11/05/2017 for Edema (bilateral lower legs- patient states it has been ongoing but wants to make sure they are not infected.)   HPI Bilateral lower leg swelling and cirrhosis Patient comes in for recheck on lower leg swelling and cirrhosis.  His leg swelling has been worsened and he has developed some redness and more drainage around his left lower leg and he was concerned that it was getting infected.  He has been fighting this recurrently because of his cirrhosis and leg swelling.  Patient has also been diagnosed recently with portal hypertension.  Patient has peripheral neuropathy that is known and currently takes gabapentin for it and says that it has not been as well controlled as it has been previously.  But is okay with it for now and does not want to increase anything for now.  Patient says the numbness and tingling is mostly in his feet and his toes.  Relevant past medical, surgical, family and social history reviewed and updated as indicated. Interim medical history since our last visit reviewed. Allergies and medications reviewed and updated.  Review of Systems  Constitutional: Negative for chills and fever.  Respiratory: Negative for shortness of breath and wheezing.   Cardiovascular: Positive for leg swelling. Negative for chest pain and palpitations.  Gastrointestinal: Positive for abdominal distention. Negative for abdominal pain, constipation, diarrhea, nausea and vomiting.  Musculoskeletal: Negative for back pain and gait problem.  Skin: Negative for rash.  Neurological: Positive for numbness.  All other systems reviewed and are negative.   Per HPI unless specifically indicated above   Allergies as of  11/05/2017   No Known Allergies     Medication List        Accurate as of 11/05/17  4:31 PM. Always use your most recent med list.          atorvastatin 40 MG tablet Commonly known as:  LIPITOR Take 1 tablet (40 mg total) by mouth daily at 6 PM.   CENTRUM SILVER 50+MEN PO Take 1 tablet by mouth daily.   cephALEXin 500 MG capsule Commonly known as:  KEFLEX Take 1 capsule (500 mg total) by mouth 4 (four) times daily.   cholecalciferol 1000 units tablet Commonly known as:  VITAMIN D Take 2,000 Units by mouth daily.   ferrous sulfate 325 (65 FE) MG tablet Take 1 tablet (325 mg total) by mouth 2 (two) times daily with a meal.   folic acid 1 MG tablet Commonly known as:  FOLVITE Take 1 tablet by mouth daily at 2 PM.   furosemide 20 MG tablet Commonly known as:  LASIX Take 3 tablets (60 mg total) by mouth 2 (two) times daily.   gabapentin 300 MG capsule Commonly known as:  NEURONTIN Take 2 capsules (600 mg total) by mouth 2 (two) times daily.   lactulose 10 GM/15ML solution Commonly known as:  CHRONULAC Take 15 mLs (10 g total) by mouth 3 (three) times daily.   Potassium Chloride ER 20 MEQ Tbcr Take 80 mg by mouth 2 (two) times daily.   prednisoLONE acetate 1 % ophthalmic suspension Commonly known as:    PRED FORTE Place 1 drop into the left eye 4 (four) times daily.   sertraline 50 MG tablet Commonly known as:  ZOLOFT Take 1 tablet (50 mg total) by mouth daily.   spironolactone 25 MG tablet Commonly known as:  ALDACTONE Take 1 tablet (25 mg total) by mouth 2 (two) times daily.   thiamine 100 MG tablet Take 100 mg by mouth daily.   VENTOLIN HFA 108 (90 Base) MCG/ACT inhaler Generic drug:  albuterol Please specify directions, refills and quantity          Objective:    BP (!) 99/59   Pulse 72   Temp 97.9 F (36.6 C) (Oral)   Ht 5' 11" (1.803 m)   Wt 200 lb (90.7 kg)   BMI 27.89 kg/m   Wt Readings from Last 3 Encounters:  11/05/17 200 lb (90.7  kg)  10/17/17 219 lb (99.3 kg)  09/19/17 192 lb 4 oz (87.2 kg)    Physical Exam  Constitutional: He is oriented to person, place, and time. He appears well-developed and well-nourished. No distress.  Eyes: Conjunctivae are normal. No scleral icterus.  Neck: Neck supple. No thyromegaly present.  Cardiovascular: Normal rate, regular rhythm, normal heart sounds and intact distal pulses.  No murmur heard. Pulmonary/Chest: Effort normal and breath sounds normal. No respiratory distress. He has no wheezes.  Musculoskeletal: Normal range of motion. He exhibits no edema.  Lymphadenopathy:    He has no cervical adenopathy.  Neurological: He is alert and oriented to person, place, and time. Coordination normal.  Skin: Skin is warm and dry. No rash noted. He is not diaphoretic.  Psychiatric: He has a normal mood and affect. His behavior is normal.  Nursing note and vitals reviewed.  Nurse to place an Unna boot    Assessment & Plan:   Problem List Items Addressed This Visit      Cardiovascular and Mediastinum   Portal hypertension (HCC)   Relevant Medications   atorvastatin (LIPITOR) 40 MG tablet   furosemide (LASIX) 20 MG tablet   spironolactone (ALDACTONE) 25 MG tablet   Other Relevant Orders   CMP14+EGFR (Completed)     Digestive   Cirrhosis of liver without ascites (HCC) - Primary   Relevant Orders   CMP14+EGFR (Completed)     Nervous and Auditory   Peripheral neuropathy, idiopathic   Relevant Medications   gabapentin (NEURONTIN) 300 MG capsule   sertraline (ZOLOFT) 50 MG tablet    Other Visit Diagnoses    Left leg cellulitis       Relevant Medications   cephALEXin (KEFLEX) 500 MG capsule   Bilateral lower extremity edema       Relevant Medications   cephALEXin (KEFLEX) 500 MG capsule      Instruct home health care to check at least initially his basic metabolic panel every 2 weeks  Change Unna boot weekly  Patient has home health care with [facility]  Follow up  plan: Return in about 3 months (around 02/05/2018), or if symptoms worsen or fail to improve, for Follow-up hypertension.  Counseling provided for all of the vaccine components Orders Placed This Encounter  Procedures  . CMP14+EGFR     , MD [facility] 11/05/2017, 4:31 PM     

## 2017-11-05 NOTE — Telephone Encounter (Signed)
Spoke with pt's sister (on HIPPA form); informed sister of results note-instructed her that pt needs to keep his appt with PCP 11/05/17 and also keep the appt with office 11/06/17 due to result of CT scan;  She was also advised there is a cardiology referral being submitted by the PCP - she stated the pt is wanting to switch to a cardiologist within [facility]-advised her she could request a [facility] provider when she takes the pt to PCP today since PCP is the one submitting the referral;-pt's sister verbalized understanding; Also advised pt's sister to call back if any questions/concerns arise

## 2017-11-05 NOTE — Telephone Encounter (Signed)
If they gave him an antibiotic and treated and then he is okay to see me in 1 week

## 2017-11-05 NOTE — Telephone Encounter (Signed)
Received message from gastroenterology showing that patient has a CT scan which shows thickened pericardium and pericardial effusion and will do referral for cardiology Caryl Pina, MD [facility] 11/05/2017, 8:00 AM

## 2017-11-05 NOTE — Telephone Encounter (Signed)
-----   Message from Irving Copas., MD sent at 11/05/2017  1:17 AM EDT ----- [name], this is a high priority follow up. I saw the CT scan and there are findings of likely portal hypertension so underlying cirrhosis may be playing a role with some of the liver test abnormalities. However, we need to reach the main doctor as well as get this information to his Cardiologist in regards to the findings on the pericardium. I still suspect this is the more likely etiology of things and I'm not sure if he needs to have this region drained or worked on, but that will need to take precedence. Please call the family to let them know that we will reach out to the patient's facility MD. Once you get that information let me know because I should try and reach them before I leave and we get this information to them so they can determine what they need to do next. I am placing the patient's PCP on here as well, because follow up will be required and likely Cardiology decision as to what to do next, I'm not sure if he will need to be admitted for this. Thanks. Kent Bryant

## 2017-11-05 NOTE — Telephone Encounter (Signed)
FYI. He is not on an antibiotic and needs orders on how to dress the leg. He is coming in at 4:10 today

## 2017-11-06 ENCOUNTER — Ambulatory Visit (INDEPENDENT_AMBULATORY_CARE_PROVIDER_SITE_OTHER): Payer: 59 | Admitting: Nurse Practitioner

## 2017-11-06 ENCOUNTER — Telehealth: Payer: Self-pay | Admitting: Nurse Practitioner

## 2017-11-06 ENCOUNTER — Encounter: Payer: Self-pay | Admitting: Nurse Practitioner

## 2017-11-06 VITALS — BP 126/60 | HR 68 | Ht 71.0 in | Wt 198.4 lb

## 2017-11-06 DIAGNOSIS — R188 Other ascites: Secondary | ICD-10-CM | POA: Diagnosis not present

## 2017-11-06 DIAGNOSIS — K766 Portal hypertension: Secondary | ICD-10-CM

## 2017-11-06 LAB — CMP14+EGFR
ALBUMIN: 3.2 g/dL — AB (ref 3.6–4.8)
ALT: 16 IU/L (ref 0–44)
AST: 34 IU/L (ref 0–40)
Albumin/Globulin Ratio: 0.8 — ABNORMAL LOW (ref 1.2–2.2)
Alkaline Phosphatase: 174 IU/L — ABNORMAL HIGH (ref 39–117)
BILIRUBIN TOTAL: 1.5 mg/dL — AB (ref 0.0–1.2)
BUN / CREAT RATIO: 13 (ref 10–24)
BUN: 11 mg/dL (ref 8–27)
CHLORIDE: 93 mmol/L — AB (ref 96–106)
CO2: 28 mmol/L (ref 20–29)
CREATININE: 0.82 mg/dL (ref 0.76–1.27)
Calcium: 8.7 mg/dL (ref 8.6–10.2)
GFR, EST AFRICAN AMERICAN: 107 mL/min/{1.73_m2} (ref >59)
GFR, EST NON AFRICAN AMERICAN: 93 mL/min/{1.73_m2} (ref >59)
GLUCOSE: 99 mg/dL (ref 65–99)
Globulin, Total: 3.8 g/dL (ref 1.5–4.5)
Potassium: 4.5 mmol/L (ref 3.5–5.2)
Sodium: 137 mmol/L (ref 134–144)
TOTAL PROTEIN: 7 g/dL (ref 6.0–8.5)

## 2017-11-06 NOTE — Telephone Encounter (Signed)
Patients sister calling to see if results and notes can be sent to patients cardiologist Dr. Terrence Dupont at fax [phone] so that MD can possibly order a MRI. Pt seen today.

## 2017-11-06 NOTE — Patient Instructions (Signed)
If you are age 66 or older, your body mass index should be between 23-30. Your Body mass index is 27.67 kg/m. If this is out of the aforementioned range listed, please consider follow up with your Primary Care Provider.  If you are age 42 or younger, your body mass index should be between 19-25. Your Body mass index is 27.67 kg/m. If this is out of the aformentioned range listed, please consider follow up with your Primary Care Provider.   Dr. Rush Landmark will call you.  Thank you for choosing me and [facility].   Tye Savoy, NP

## 2017-11-06 NOTE — Progress Notes (Signed)
Chief Complaint:    Follow up on abnormal liver test  IMPRESSION and PLAN:    67 year old male with edema / anasarca, abnormal liver tests (mainly bilirubin and alkaline phos). CT scan shows several findings of portal HTN despite only mild irregularity of liver surface.   If has cirrhosis then ? NASH related but steatosis not mentioned on imaging. No hx of heavy ETOH use. Autoimmune / genetic markers of liver disease negative. Viral hepatitis studies negative. No evidence for iron overload.   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  -No significant lower extremity edema today. Renal function normal as of yesterday. Continue current dose of diuretics.  -Reiterated need for strict 2000mg  Na+ diet.  -eventual dx paracentesis would be helpful, for now we are awaiting Cardiology's input.  -Pericardial effusion on recent CT scan. I believe that Dr. Rush Landmark was going to communicate with patient's Cardiologist  but not sure if he has yet done so. Also family mentions that they are supposed to be establishing care with a [facility] Cardiologist at some point.    HPI:    Patient is a 67 year old male who has been seeing Dr. Rush Landmark for evaluation of abnormal liver tests.  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  Over the summer, while hospitalized at [facility], patient was found to have an elevated bilirubin and mild elevation in transaminases. His bilirubin rose as high as 8.3.  He apparently had small volume ascites, insufficient for paracentesis. He was started on lactulose for elevated ammonia. During that admission he was being evaluated for constrictive pericarditis, not felt to be good candidate for pericardial window. He was discharged home on diuretics. He was subsequently referred to [place] by Cardiology.  We ordered numerous liver studies as well  as a CT scan of the abdomen and pelvis to look for cirrhosis/ascites. We felt that anasarca was most likely cardiac in nature and recommended cardiac MRI. Losing weight since starting aldactone. Lost 21 pounds in last two weeks. Labs by PCP yesterday show normal renal function.  Normal sodium. He tries to watch sodium intake.    CT scan showed findings of portal venous hypertension with recannulization of the umbilical vein and splenorenal shunting raising suspicion for cirrhosis.  There was third spacing with upper abdominal ascites.  There were pleural effusions. Prominent prior calcific pericarditis, with dense sheets of calcification along the pericardial margins. These outline several collections of complex fluid including a 97 cubic cm anterior inferior pericardial fluid collection. There was pelvic adenopathy in addition to mildly enlarged retroperitoneal adenopathy.  We sent CT scan result to Dr. Terrence Dupont but patient hasn't heard anything back yet. Family inquiring about a liver biospey.     Review of systems:     No chest pain, no SOB, no fevers, no urinary sx   Past Medical History:  Diagnosis Date  . Abnormal CXR (chest x-ray)   . Agoraphobia   . Anxiety   . Bipolar disorder ([place])   . Chronic low back pain   . Coagulopathy ([place])   . COPD (chronic obstructive pulmonary disease) ([place])   . Depression   . 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 Insomnia   . Osteopenia   . Peripheral edema   . Peripheral neuropathy   . Vitamin D deficiency     Patient's surgical history, family medical history, social history,  medications and allergies were all reviewed in Epic   Serum creatinine: 0.82 mg/dL 11/05/17 1641 Estimated creatinine clearance: 95.7 mL/min  Current Outpatient Medications  Medication Sig Dispense Refill  . atorvastatin (LIPITOR) 40 MG tablet Take 1 tablet (40 mg total) by mouth daily at 6 PM. 30 tablet 5  . cephALEXin (KEFLEX) 500 MG capsule Take 1 capsule (500 mg total) by mouth 4  (four) times daily. 28 capsule 0  . cholecalciferol (VITAMIN D) 1000 units tablet Take 2,000 Units by mouth daily.    . ferrous sulfate 325 (65 FE) MG tablet Take 1 tablet (325 mg total) by mouth 2 (two) times daily with a meal. 60 tablet 5  . folic acid (FOLVITE) 1 MG tablet Take 1 tablet by mouth daily at 2 PM.  0  . furosemide (LASIX) 20 MG tablet Take 3 tablets (60 mg total) by mouth 2 (two) times daily. 180 tablet 5  . gabapentin (NEURONTIN) 300 MG capsule Take 2 capsules (600 mg total) by mouth 2 (two) times daily. 120 capsule 5  . lactulose (CHRONULAC) 10 GM/15ML solution Take 15 mLs (10 g total) by mouth 3 (three) times daily. 240 mL 2  . Multiple Vitamins-Minerals (CENTRUM SILVER 50+MEN PO) Take 1 tablet by mouth daily.    . Potassium Chloride ER 20 MEQ TBCR Take 80 mEq by mouth 2 (two) times daily. 60 tablet 5  . prednisoLONE acetate (PRED FORTE) 1 % ophthalmic suspension Place 1 drop into the left eye 4 (four) times daily.    . sertraline (ZOLOFT) 50 MG tablet Take 1 tablet (50 mg total) by mouth daily. 90 tablet 3  . spironolactone (ALDACTONE) 25 MG tablet Take 1 tablet (25 mg total) by mouth 2 (two) times daily. 60 tablet 5  . VENTOLIN HFA 108 (90 Base) MCG/ACT inhaler Please specify directions, refills and quantity 1 Inhaler 4   No current facility-administered medications for this visit.     Physical Exam:     BP 126/60   Pulse 68   Ht 5\' 11"  (1.803 m)   Wt 198 lb 6 oz (90 kg)   BMI 27.67 kg/m   GENERAL: White male in wheelchair in NAD PSYCH: : Cooperative CARDIAC:  RRR, labs wrapped but don't appear to be significantly swollen PULM: Normal respiratory effort, lungs CTA bilaterally, no wheezing ABDOMEN:  Limited exam in wheelchair. Abd slightly protuberant but, soft, nontender.  SKIN:  turgor, no lesions seen Musculoskeletal:  Normal muscle tone, normal strength NEURO: Alert and oriented x 3 but sleepy at times   Tye Savoy , NP 11/06/2017, 10:08 AM

## 2017-11-07 ENCOUNTER — Telehealth: Payer: Self-pay | Admitting: Nurse Practitioner

## 2017-11-07 ENCOUNTER — Telehealth: Payer: Self-pay | Admitting: Cardiology

## 2017-11-07 NOTE — Telephone Encounter (Signed)
CT ordered by Dr Rush Landmark has already been sent to cardiology.  Note routed per patient's request.

## 2017-11-07 NOTE — Telephone Encounter (Signed)
Pt sister is calling to speak to nurse regarding cardiology appt and mri that Nevin Bloodgood wanted patient to get done/

## 2017-11-07 NOTE — Telephone Encounter (Signed)
Looks like Dr. Rush Landmark is his [facility] doc

## 2017-11-07 NOTE — Telephone Encounter (Signed)
Thank you for clarifying. Sending to Dr Rush Landmark.

## 2017-11-07 NOTE — Telephone Encounter (Signed)
Thank you Dan, I am his primary GI. He established ot evaluate if he had cirrhosis. I think his most recent imaging suggests that he does with evidence of likely portal hypertension.  But no overt ascites present either. However, the cardiac issues and now loculated pericardial effusion require further workup. His PCP, Dr. Warrick Parisian, has already put in a referral to [facility], so that should be in place. With that being said, I do not think we need a MRI currently until the cardiac workup and managements is established. I will proceed with him being considered advanced fibrosis/cirrhosis at this time. Will consider MRI in future but not most important thing currently. He has ascites and at some point ot further clarify things getting a sample of ascites and sending for fluid studies can be considered but not most important currenlty. In terms of the cardiac MRI, for now, the referral to [facility] is most important and if he is having worsening issues to seek help with PCP or to the hospital but from notation in [place] Guenterh's note seems he has been losing weight.

## 2017-11-07 NOTE — Telephone Encounter (Signed)
Not clear who is primary GI for this patient. Portal hypertension-seen 11/06/17 Patient and sister did reach out to Dr. Terrence Dupont to see if they really need a Cardiac MRI or further workup since our concern is that this is primary component Dr Terrence Dupont wants the patient to be established with a cardiologist with [facility].

## 2017-11-07 NOTE — Telephone Encounter (Signed)
The pt's sister was advised that the pt should continue with the recommendations provided by his PCP; cardiology appt.

## 2017-11-10 ENCOUNTER — Encounter: Payer: Self-pay | Admitting: Nurse Practitioner

## 2017-11-12 ENCOUNTER — Encounter: Payer: Self-pay | Admitting: Cardiology

## 2017-11-12 ENCOUNTER — Ambulatory Visit (INDEPENDENT_AMBULATORY_CARE_PROVIDER_SITE_OTHER): Payer: 59 | Admitting: Cardiology

## 2017-11-12 ENCOUNTER — Ambulatory Visit (INDEPENDENT_AMBULATORY_CARE_PROVIDER_SITE_OTHER): Payer: 59

## 2017-11-12 VITALS — BP 114/68 | HR 69 | Ht 71.0 in | Wt 190.6 lb

## 2017-11-12 DIAGNOSIS — I311 Chronic constrictive pericarditis: Secondary | ICD-10-CM

## 2017-11-12 DIAGNOSIS — Z23 Encounter for immunization: Secondary | ICD-10-CM | POA: Diagnosis not present

## 2017-11-12 NOTE — Progress Notes (Addendum)
Clinical Summary Mr. Bienvenue is a 67 y.o.male seen today for follow up of the following medical problems.     1. 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 Previous imaging at [facility] 06/2017  was reviewed and showed evidence of pericardial calcification. He was transferred to [facility] that admission since that is where he followed - from notes turned down by CT surgery for consideration for possible pericardectomy at the time due to comorbidities.   -weights 270 during 07/2017 admission, today 190 lbs - DOE at 1 block which is improved. Occasional orthopnea.   2. Portal hypertension - followed by GI. 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 Chest wall hematoma - 06/2017 CT at [facility] showed larger left posterolateral chest wall hematoma.    5. Lymphadenopathy - 06/2017 CTA chest showed numerous axillary, mediastinal, abomdinal, and pelvic lymph nodes - appears had lymph node biospy done at [facility], records not available at this time.    Past Medical History:  Diagnosis Date  . Abnormal CXR (chest x-ray)   . Agoraphobia   . Anxiety   . Bipolar disorder ([place])   . Chronic low back pain   . Coagulopathy ([place])   . COPD (chronic obstructive pulmonary disease) ([place])   . Depression   . Depression   . Hemophilia ([place])   . Insomnia   . Osteopenia   . Peripheral edema   . Peripheral neuropathy   . Peripheral neuropathy   . Vitamin D deficiency      No Known Allergies   Current Outpatient Medications  Medication Sig Dispense Refill  . atorvastatin (LIPITOR) 40 MG tablet Take 1 tablet (40 mg total) by mouth daily at 6 PM. 30 tablet 5  . cephALEXin (KEFLEX) 500 MG  capsule Take 1 capsule (500 mg total) by mouth 4 (four) times daily. 28 capsule 0  . cholecalciferol (VITAMIN D) 1000 units tablet Take 2,000 Units by mouth daily.    . ferrous sulfate 325 (65 FE) MG tablet Take 1 tablet (325 mg total) by mouth 2 (two) times daily with a meal. 60 tablet 5  . folic acid (FOLVITE) 1 MG tablet Take 1 tablet by mouth daily at 2 PM.  0  . furosemide (LASIX) 20 MG tablet Take 3 tablets (60 mg total) by mouth 2 (two) times daily. 180 tablet 5  . gabapentin (NEURONTIN) 300 MG capsule Take 2 capsules (600 mg total) by mouth 2 (two) times daily. 120 capsule 5  . lactulose (CHRONULAC) 10 GM/15ML solution Take 15 mLs (10 g total) by mouth 3 (three) times daily. 240 mL 2  . Multiple Vitamins-Minerals (CENTRUM SILVER 50+MEN PO) Take 1 tablet by mouth daily.    . Potassium Chloride ER 20 MEQ TBCR Take 80 mEq by mouth 2 (two) times daily. 60 tablet 5  . prednisoLONE acetate (PRED FORTE) 1 % ophthalmic suspension Place 1 drop into the left eye 4 (four) times daily.    . sertraline (ZOLOFT) 50 MG tablet Take 1 tablet (50 mg total) by mouth daily. 90 tablet 3  . spironolactone (ALDACTONE) 25 MG tablet Take 1 tablet (25 mg total) by mouth 2 (two) times daily. 60 tablet 5  . VENTOLIN HFA 108 (  90 Base) MCG/ACT inhaler Please specify directions, refills and quantity 1 Inhaler 4   No current facility-administered medications for this visit.      Past Surgical History:  Procedure Laterality Date  . CATARACT EXTRACTION  05/29/2017  . CATARACT EXTRACTION W/PHACO Left 06/13/2017   Procedure: CATARACT EXTRACTION PHACO AND INTRAOCULAR LENS PLACEMENT LEFT EYE;  Surgeon: Baruch Goldmann, MD;  Location: [facility];  Service: Ophthalmology;  Laterality: Left;  CDE: 23.32     No Known Allergies    Family History  Problem Relation Age of Onset  . Stroke Mother   . Colon cancer Neg Hx   . Esophageal cancer Neg Hx   . Liver disease Neg Hx   . Pancreatic cancer Neg Hx   . Stomach cancer Neg  Hx      Social History Mr. Payer reports that he has been smoking cigarettes. He has a 7.50 pack-year smoking history. He has never used smokeless tobacco. Mr. Ron reports that he does not drink alcohol.   Review of Systems CONSTITUTIONAL: No weight loss, fever, chills, weakness or fatigue.  HEENT: Eyes: No visual loss, blurred vision, double vision or yellow sclerae.No hearing loss, sneezing, congestion, runny nose or sore throat.  SKIN: No rash or itching.  CARDIOVASCULAR: no chest pain, no palpitations.  RESPIRATORY: No shortness of breath, cough or sputum.  GASTROINTESTINAL: No anorexia, nausea, vomiting or diarrhea. No abdominal pain or blood.  GENITOURINARY: No burning on urination, no polyuria NEUROLOGICAL: No headache, dizziness, syncope, paralysis, ataxia, numbness or tingling in the extremities. No change in bowel or bladder control.  MUSCULOSKELETAL: No muscle, back pain, joint pain or stiffness.  LYMPHATICS: No enlarged nodes. No history of splenectomy.  PSYCHIATRIC: No history of depression or anxiety.  ENDOCRINOLOGIC: No reports of sweating, cold or heat intolerance. No polyuria or polydipsia.  Marland Kitchen   Physical Examination Vitals:   11/12/17 0859  BP: 114/68  Pulse: 69  SpO2: 96%   Vitals:   11/12/17 0859  Weight: 190 lb 9.6 oz (86.5 kg)  Height: 5\' 11"  (1.803 m)    Gen: resting comfortably, no acute distress HEENT: no scleral icterus, pupils equal round and reactive, no palptable cervical adenopathy,  CV: RRR, no mr/g, no jvd Resp: Clear to auscultation bilaterally GI: abdomen is soft, non-tender, non-distended, normal bowel sounds, no hepatosplenomegaly MSK: extremities are warm, no edema.  Skin: warm, no rash Neuro:  no focal deficits Psych: appropriate affect   Diagnostic Studies  07/2017 echo Study Conclusions  - Limited echo to evaluate further for constrictive physiology. - Left ventricle: Systolic function was normal. The estimated   ejection  fraction was in the range of 60% to 65%. There is a   septal bounce present. - There is 25% respiratory variation across the MV and 30% across   the TV. Medial e&' 16, lateral e&' 10, E/A ratio 3.2 and decel time   150. IVC is dilated and fixed. There is respiratory variation   across the hepatic vein. - Findings consistent with constrictive physiology.   07/2017 echo Study Conclusions  - Left ventricle: The cavity size was normal. Wall thickness was   normal. Systolic function was normal. The estimated ejection   fraction was in the range of 55% to 60%. Mixed diastolic   findings. High E/A ratio with borderline low deceleration time,   blunting of pulmonary venous flow, normal left atrium, normal   tissue velocities. There is a septal bounce. - Aortic valve: Valve area (VTI): 1.88 cm^2. Valve  area (Vmax):   1.86 cm^2. Valve area (Vmean): 1.77 cm^2. - Mitral valve: There was mild regurgitation. - Pulmonary veins: There is systolic blunting of pulmonary vein   flow consistent with elevated LA pressure. - Right ventricle: There is intermittent flattening of the   ventricular septum that appears to vary with the respriatory   cycle. - There are some signs concerning for possible constrictive   pericarditis. Will order repeat limited study to evaluate   respiratory variation of inflow as well as repeat mitral inflow,   tissue velocities, obtain hepatic flow Dopplers.  10/2017 CT A/P IMPRESSION: 1. Although there is only subtle lobularity of the hepatic margin, there are considerable findings of portal venous hypertension including uphill paraesophageal varices, recanalization of the umbilical vein, and splenorenal shunting. These factors raise suspicion for underlying cirrhotic liver disease. 2. Contracted gallbladder with small gallstones. 3. Third spacing of fluid with mesenteric and subcutaneous edema, upper abdominal ascites, and right greater than left pleural effusions. 4.  Findings of prominent prior calcific pericarditis, with dense sheets of calcification along the pericardial margins. These outline several collections of complex fluid including a 97 cubic cm anterior inferior pericardial fluid collection. 5. Pelvic adenopathy in addition to mildly enlarged retroperitoneal adenopathy. While some of this could well be reactive adenopathy from cirrhosis, the pelvic adenopathy in particular is nonspecific and entity such as lymphoma are not readily excluded based on imaging. 6. Impingement at L4-5 and H8-I6. 7. Umbilical hernia containing adipose tissue and vessels.   [place] studies 07/2017 echo SUMMARY The left ventricular size is normal. There is normal left ventricular wall thickness.  Left ventricular systolic function is normal. LV ejection fraction = 65-70%. No segmental wall motion abnormalities seen in the left ventricle. Septal bounce is noted.  Left ventricular filling pattern is indeterminate. The right ventricular cavity is small on RV focused A4c views due to  extrinsic compression from a bright echodensity better visualized by prior CT  chests. The right ventricular systolic function is normal. The left atrium is mildly dilated.  Right atrial size is normal with no evidence of collapse on diastole. There is mild aortic and mitral regurgitation. The IVC is mildly dilated with an abnormal collapsibility index, this  suggestive of increased right atrial pressure. There is a ascites present. Prior study, dated 06/26/17, is a limited study, however no significant change  is noted.   06/2017 CT PET 1.No hypermetabolic adenopathy to suggest aggressive lymphoma. PET/CT is limited in evaluation for indolent lymphoma. 2.Decreased size of the hematoma along the left chest wall.Mildly increased uptake within the right semitendinosus musculature and left lower quadrant abdominal wall, favored to relate to additional sites of  contusion/hematoma.  06/2017 CT chest CONCLUSION:  Moderate bilateral pleural effusions have increased slightly.  Slight interval worsening of atelectasis associated with pleural effusions. The left lower lobe and lingula are densely opacified. Pneumonia or aspiration could be superimposed on atelectasis.  Unchanged extensive coarse pericardial calcification, likely related to remote hematoma. This places the patient at risk for constrictive physiology. There is a suggestion of distention of the inferior vena cava.  Patulous esophagus with fluid level in the mid thoracic esophagus, placing the patient risk for aspiration.  Interval development of small volume ascites.  Body wall edema has improved slightly. Left chest wall hematoma has improved slightly.  Suspected development of bilateral hemarthrosis in the partially visualized glenohumeral joints   06/2017 US guided lymph node biopsy  1.Technically successful U/S guided fine-needle aspiration of left inguinal  lymph node. 2.No immediate complications. 3.Samples were sent to Pathology.   06/2017 CTA chest A/P 1. Large left posterolateral chest wall hematoma. Limited evaluation for active extravasation due to  incorrect bolus timing. If there are clinical findings of active extravasation, a follow-up chest CTA could be performed. 2. Numerous axillary, mediastinal, abdominal and pelvic lymph nodes. Bilateral inguinal lymph nodes and right pelvic lymph nodes are enlarged as described above. The differential includes lymphoma or infectious/inflammatory etiologies. Given the distribution, metastatic disease is felt to be less likely. 3. Extensive pericardial calcification which is associated with constrictive pericarditis. Echocardiogram may be helpful for further evaluation. 4. Small bilateral pleural effusions with bibasilar atelectasis.  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 Etiology remains unclear of chronic inflammatory pericardial process, review records from [facility] further. No history of chest radiation per his report.  - previously he was turned down by CT surgery at [facility] for pericardectomy consideration. He is down 80 lbs since then and fluid status much improved, may warrant reconsideration  2. 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 Lymph node biopsy 06/2017 was nondiagnostic. From heme/onc notes indolent lymphoma cannot be entirely ruled out. Has been clinically stable without recent bleeding.    - during admission at [facility] he received high dose dexamethasone and 1 dose of rituximab.   3. Chest wall hematoma - admitted to [facility] 06/2017 after a fall at home. Admitted with Hgb 6.9, abnormal PTT. Found toh ave a large extrathoracic hemoatoma long the left posterior chest and abdominal wall.  Later found to have acquired factor VIII inhibitor - 06/2017 echo at [facility] showed a well delineated mass that measured 7.5 x 3.5 cm that appeared to be in the pericardiaum compression the RV/RA, thought was this was related to the hematoma. Plans were for cardiac MRI but patient was too dyspneic to lay down for procedure.   F/u 1 month   11/24/2017 addendum Repeat echo personally reviewed. RV septal bounce, high E/A ratio with decel time 120, fairly mild LAVI of 30, MV inflow variation around 23%, septal e' 12 with a lateral e' of 9. Fixed dilated IVC. No clear pericardial effusion noted on echo, in subcostal  view round apparent fluid collection may be related to prior chest wall hematoma. Prior CT findings have shown evidence of pericardial calcification.   Will plan for cardiac MRI, likely then referral to CHF clinic to consider hemodynamic cath to confirm constritive pericarditis.   Arnoldo Lenis, M.D.

## 2017-11-12 NOTE — Patient Instructions (Signed)
Your physician recommends that you schedule a follow-up appointment in: 1 MONTH WITH DR BRANCH  Your physician recommends that you continue on your current medications as directed. Please refer to the Current Medication list given to you today.  Your physician has requested that you have an echocardiogram. Echocardiography is a painless test that uses sound waves to create images of your heart. It provides your doctor with information about the size and shape of your heart and how well your heart's chambers and valves are working. This procedure takes approximately one hour. There are no restrictions for this procedure.  Thank you for choosing [facility]!!    

## 2017-11-13 ENCOUNTER — Ambulatory Visit: Payer: Medicare Other | Admitting: Family Medicine

## 2017-11-13 NOTE — Progress Notes (Signed)
Agree with assessment and plan as outlined by PA Chester Holstein. I was aware of the CT imaging findings and he was made aware as was his family. His PCP placed referral for Cardiology at [facility] to further evaluate and consider next steps in evaluation. I think his cross-sectional imaging is showing evidence of portal hypertension, so a liver biopsy is unlikely to be necessary at this time. Follow up to be done after his Cardiology evaluation and workup continues.

## 2017-11-14 ENCOUNTER — Encounter: Payer: Self-pay | Admitting: Cardiology

## 2017-11-19 ENCOUNTER — Other Ambulatory Visit: Payer: Self-pay

## 2017-11-19 ENCOUNTER — Ambulatory Visit (INDEPENDENT_AMBULATORY_CARE_PROVIDER_SITE_OTHER): Payer: 59

## 2017-11-19 DIAGNOSIS — I311 Chronic constrictive pericarditis: Secondary | ICD-10-CM | POA: Diagnosis not present

## 2017-11-24 ENCOUNTER — Telehealth: Payer: Self-pay | Admitting: Cardiology

## 2017-11-24 ENCOUNTER — Telehealth: Payer: Self-pay | Admitting: *Deleted

## 2017-11-24 NOTE — Telephone Encounter (Signed)
Pt sister Inez Catalina) DPR voiced understanding and says pt is extremely claustrophobic and wants to know if there would be another test that would be an option

## 2017-11-24 NOTE — Telephone Encounter (Signed)
-----   Message from Arnoldo Lenis, MD sent at 11/24/2017 10:17 AM EST ----- Echo continues to show evidence of the thickened/stiff sack around the heart. He needs a cardiac MRI to better evaluate this sack and any possible surrounding fluid. Please order cardiac MRI at [facility] for constrictive pericarditis.   Carlyle Dolly MD

## 2017-11-25 NOTE — Telephone Encounter (Signed)
MRI would give [place] the best view of the lining of the heart and any potential fluid and what that fluid may be. Has he ever had an MRI before? If so did they try sedation?   Zandra Abts MD

## 2017-11-25 NOTE — Telephone Encounter (Signed)
Pt sister says pt has never had an MRI but would probably be best if he was sedated - they would like to discuss and call us back tomorrow if they decide to do the test

## 2017-11-26 ENCOUNTER — Telehealth: Payer: Self-pay | Admitting: Cardiology

## 2017-11-26 NOTE — Telephone Encounter (Signed)
PER PHONE CALL FROM PT'S SISTER-- PT IS VERY CLAUSTROPHOBIC AND WILL NEED AN OPEN MRI

## 2017-11-27 ENCOUNTER — Telehealth: Payer: Self-pay | Admitting: Family Medicine

## 2017-11-27 NOTE — Telephone Encounter (Signed)
Sister Inez Catalina) notified & verbalized understanding.

## 2017-11-27 NOTE — Telephone Encounter (Signed)
I will have to look into this, I am not sure there is an open MRI option for cardiac MRIs. I will ask some of our imaging specialists and we will get back to them.   Carlyle Dolly MD

## 2017-11-28 MED ORDER — LACTULOSE 10 GM/15ML PO SOLN: 10.0000 g | Freq: Three times a day (TID) | ORAL | 1 refills | Status: DC

## 2017-11-28 NOTE — Telephone Encounter (Signed)
Aware Rx sent to pharmacy

## 2017-12-02 ENCOUNTER — Telehealth: Payer: Self-pay | Admitting: *Deleted

## 2017-12-02 NOTE — Telephone Encounter (Signed)
VM from [facility] [place] contacted [facility] nurse, pt's legs have started to wheep again when she visited the other day there was no open spots, his edema was about greater on the right leg than left about a 1+, they had been elevated mostly. If he is up they are about a 2+  She is needing a detailed order for dressings and how often to change, last time he was unable to tolerate the unna boot.

## 2017-12-03 NOTE — Telephone Encounter (Signed)
Aware of provider's directions for leg care.

## 2017-12-03 NOTE — Telephone Encounter (Signed)
Wrapped with Ace bandages daily, may remove fat bedtime but put on as soon as he gets up in the morning, replace them every day.  If any open sores develop then let me know.  Wrap from his foot all the way to just below his knee every morning.  May also place absorbent ABD pad below Ace wraps if there is a lot of seepage

## 2017-12-05 ENCOUNTER — Other Ambulatory Visit: Payer: Self-pay | Admitting: Family Medicine

## 2017-12-11 ENCOUNTER — Encounter: Payer: Self-pay | Admitting: Family Medicine

## 2017-12-11 ENCOUNTER — Telehealth: Payer: Self-pay | Admitting: Family Medicine

## 2017-12-11 ENCOUNTER — Other Ambulatory Visit: Payer: Self-pay | Admitting: *Deleted

## 2017-12-11 NOTE — Telephone Encounter (Signed)
Have him continue to take 2 in the morning and 2 in the afternoon

## 2017-12-11 NOTE — Telephone Encounter (Signed)
PT sister Inez Catalina has called questioning how the pt should be taking his potassium. States that for last 3 months he has been taking 2 in morning and 2 in afternoon that the nursing home had told him to take them like that. She isnt sure if she should continue to give it to him like that or if he should be taking 4 in morning and 4 in afternoon. Michela Pitcher that someone comes out and checks is blood and his potassium has been good when they check it. Please advise.

## 2017-12-11 NOTE — Telephone Encounter (Signed)
Opened in error

## 2017-12-12 NOTE — Telephone Encounter (Signed)
Pt's sister aware of MD feedback and voiced understanding. 

## 2017-12-16 ENCOUNTER — Ambulatory Visit (INDEPENDENT_AMBULATORY_CARE_PROVIDER_SITE_OTHER): Payer: 59 | Admitting: Cardiology

## 2017-12-16 ENCOUNTER — Other Ambulatory Visit: Payer: 59

## 2017-12-16 ENCOUNTER — Encounter: Payer: Self-pay | Admitting: *Deleted

## 2017-12-16 ENCOUNTER — Encounter: Payer: Self-pay | Admitting: Cardiology

## 2017-12-16 VITALS — BP 138/72 | HR 74 | Ht 71.0 in | Wt 200.0 lb

## 2017-12-16 DIAGNOSIS — I311 Chronic constrictive pericarditis: Secondary | ICD-10-CM

## 2017-12-16 NOTE — Progress Notes (Signed)
Clinical Summary Kent Bryant is a 67 y.o.male seen today for follow up of the following medical problems.   1. Constrictive pericarditis - patient previously followed at [facility] - we had seen him briefly during admission 07/2017 at [facility] presenting with anasarca ans signs of liver congestion - echo findings were concerning for constrictive pericarditis. Previous imaging at [facility] 06/2017  was reviewed and showed evidence of pericardial calcification. He was transferred to [facility] that admission since that is where he followed - from notes turned down by CT surgery for consideration for possible pericardectomy at the time due to comorbidities.   -weights 270 during 07/2017 admission, down to 190 lbs over last few months - home weights 190-192, no LE edema. Stable breathing - compliant with diuretics.    2. Portal hypertension - followed by GI. 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 Chest wall hematoma - 06/2017 CT at [facility] showed larger left posterolateral chest wall hematoma.    5. Lymphadenopathy - 06/2017 CTA chest showed numerous axillary, mediastinal, abomdinal, and pelvic lymph nodes - appears had lymph node biospy done at [facility] nondiagnostic   Past Medical History:  Diagnosis Date  . Abnormal CXR (chest x-ray)   . Agoraphobia   . Anxiety   . Bipolar disorder ([place])   . Chronic low back pain   . Coagulopathy ([place])   . COPD (chronic obstructive pulmonary disease) ([place])   . Depression   . Depression   . Hemophilia ([place])   . Insomnia   . Osteopenia   . Peripheral edema   . Peripheral neuropathy   . Peripheral neuropathy   . Vitamin D deficiency      No Known Allergies   Current Outpatient Medications  Medication Sig Dispense Refill  . atorvastatin (LIPITOR) 40 MG tablet Take 1 tablet (40 mg total) by mouth daily at 6 PM. 30 tablet 5  . cephALEXin  (KEFLEX) 500 MG capsule Take 1 capsule (500 mg total) by mouth 4 (four) times daily. 28 capsule 0  . cholecalciferol (VITAMIN D) 1000 units tablet Take 2,000 Units by mouth daily.    . ferrous sulfate 325 (65 FE) MG tablet Take 1 tablet (325 mg total) by mouth 2 (two) times daily with a meal. 60 tablet 5  . folic acid (FOLVITE) 1 MG tablet Take 1 tablet by mouth daily at 2 PM.  0  . furosemide (LASIX) 20 MG tablet Take 3 tablets (60 mg total) by mouth 2 (two) times daily. 180 tablet 5  . gabapentin (NEURONTIN) 300 MG capsule Take 2 capsules (600 mg total) by mouth 2 (two) times daily. 120 capsule 5  . KLOR-CON M20 20 MEQ tablet TAKE 4 TABLETS BY MOUTH 2 TIMES DAILY 240 tablet 0  . lactulose (CHRONULAC) 10 GM/15ML solution Take 15 mLs (10 g total) by mouth 3 (three) times daily. 1350 mL 1  . Multiple Vitamins-Minerals (CENTRUM SILVER 50+MEN PO) Take 1 tablet by mouth daily.    . Potassium Chloride ER 20 MEQ TBCR Take 80 mEq by mouth 2 (two) times daily. 60 tablet 5  . prednisoLONE acetate (PRED FORTE) 1 % ophthalmic suspension Place 1 drop into the left eye 4 (four) times daily.    . sertraline (ZOLOFT) 50 MG tablet Take 1 tablet (50 mg total) by mouth daily. 90 tablet 3  . spironolactone (ALDACTONE) 25 MG tablet Take 1  tablet (25 mg total) by mouth 2 (two) times daily. 60 tablet 5  . VENTOLIN HFA 108 (90 Base) MCG/ACT inhaler Please specify directions, refills and quantity 1 Inhaler 4   No current facility-administered medications for this visit.      Past Surgical History:  Procedure Laterality Date  . CATARACT EXTRACTION  05/29/2017  . CATARACT EXTRACTION W/PHACO Left 06/13/2017   Procedure: CATARACT EXTRACTION PHACO AND INTRAOCULAR LENS PLACEMENT LEFT EYE;  Surgeon: Baruch Goldmann, MD;  Location: [facility];  Service: Ophthalmology;  Laterality: Left;  CDE: 23.32     No Known Allergies    Family History  Problem Relation Age of Onset  . Stroke Mother   . Colon cancer Neg Hx   .  Esophageal cancer Neg Hx   . Liver disease Neg Hx   . Pancreatic cancer Neg Hx   . Stomach cancer Neg Hx      Social History Kent Bryant reports that he quit smoking about 5 months ago. His smoking use included cigarettes. He has a 7.50 pack-year smoking history. He has never used smokeless tobacco. Kent Bryant reports that he does not drink alcohol.   Review of Systems CONSTITUTIONAL: No weight loss, fever, chills, weakness or fatigue.  HEENT: Eyes: No visual loss, blurred vision, double vision or yellow sclerae.No hearing loss, sneezing, congestion, runny nose or sore throat.  SKIN: No rash or itching.  CARDIOVASCULAR: per hpi RESPIRATORY: No shortness of breath, cough or sputum.  GASTROINTESTINAL: No anorexia, nausea, vomiting or diarrhea. No abdominal pain or blood.  GENITOURINARY: No burning on urination, no polyuria NEUROLOGICAL: No headache, dizziness, syncope, paralysis, ataxia, numbness or tingling in the extremities. No change in bowel or bladder control.  MUSCULOSKELETAL: No muscle, back pain, joint pain or stiffness.  LYMPHATICS: No enlarged nodes. No history of splenectomy.  PSYCHIATRIC: No history of depression or anxiety.  ENDOCRINOLOGIC: No reports of sweating, cold or heat intolerance. No polyuria or polydipsia.  Marland Kitchen   Physical Examination Vitals:   12/16/17 1014  BP: 138/72  Pulse: 74  SpO2: 96%   Vitals:   12/16/17 1014  Weight: 200 lb (90.7 kg)  Height: 5\' 11"  (1.803 m)    Gen: resting comfortably, no acute distress HEENT: no scleral icterus, pupils equal round and reactive, no palptable cervical adenopathy,  CV: RRR, no mr/g no jvd Resp: Clear to auscultation bilaterally GI: abdomen is soft, non-tender, non-distended, normal bowel sounds, no hepatosplenomegaly MSK: extremities are warm, no edema.  Skin: warm, no rash Neuro:  no focal deficits Psych: appropriate affect   Diagnostic Studies 07/2017 echo Study Conclusions  - Limited echo to evaluate  further for constrictive physiology. - Left ventricle: Systolic function was normal. The estimated ejection fraction was in the range of 60% to 65%. There is a septal bounce present. - There is 25% respiratory variation across the MV and 30% across the TV. Medial e&' 16, lateral e&' 10, E/A ratio 3.2 and decel time 150. IVC is dilated and fixed. There is respiratory variation across the hepatic vein. - Findings consistent with constrictive physiology.   07/2017 echo Study Conclusions  - Left ventricle: The cavity size was normal. Wall thickness was normal. Systolic function was normal. The estimated ejection fraction was in the range of 55% to 60%. Mixed diastolic findings. High E/A ratio with borderline low deceleration time, blunting of pulmonary venous flow, normal left atrium, normal tissue velocities. There is a septal bounce. - Aortic valve: Valve area (VTI): 1.88 cm^2. Valve area (Vmax):  1.86 cm^2. Valve area (Vmean): 1.77 cm^2. - Mitral valve: There was mild regurgitation. - Pulmonary veins: There is systolic blunting of pulmonary vein flow consistent with elevated LA pressure. - Right ventricle: There is intermittent flattening of the ventricular septum that appears to vary with the respriatory cycle. - There are some signs concerning for possible constrictive pericarditis. Will order repeat limited study to evaluate respiratory variation of inflow as well as repeat mitral inflow, tissue velocities, obtain hepatic flow Dopplers.  10/2017 CT A/P IMPRESSION: 1. Although there is only subtle lobularity of the hepatic margin, there are considerable findings of portal venous hypertension including uphill paraesophageal varices, recanalization of the umbilical vein, and splenorenal shunting. These factors raise suspicion for underlying cirrhotic liver disease. 2. Contracted gallbladder with small gallstones. 3. Third spacing of fluid  with mesenteric and subcutaneous edema, upper abdominal ascites, and right greater than left pleural effusions. 4. Findings of prominent prior calcific pericarditis, with dense sheets of calcification along the pericardial margins. These outline several collections of complex fluid including a 97 cubic cm anterior inferior pericardial fluid collection. 5. Pelvic adenopathy in addition to mildly enlarged retroperitoneal adenopathy. While some of this could well be reactive adenopathy from cirrhosis, the pelvic adenopathy in particular is nonspecific and entity such as lymphoma are not readily excluded based on imaging. 6. Impingement at L4-5 and F0-Y6. 7. Umbilical hernia containing adipose tissue and vessels.   [place] studies 07/2017 echo SUMMARY The left ventricular size is normal. There is normal left ventricular wall thickness.  Left ventricular systolic function is normal. LV ejection fraction = 65-70%. No segmental wall motion abnormalities seen in the left ventricle. Septal bounce is noted.  Left ventricular filling pattern is indeterminate. The right ventricular cavity is small on RV focused A4c views due to  extrinsic compression from a bright echodensity better visualized by prior CT  chests. The right ventricular systolic function is normal. The left atrium is mildly dilated.  Right atrial size is normal with no evidence of collapse on diastole. There is mild aortic and mitral regurgitation. The IVC is mildly dilated with an abnormal collapsibility index, this  suggestive of increased right atrial pressure. There is a ascites present. Prior study, dated 06/26/17, is a limited study, however no significant change  is noted.   06/2017 CT PET 1.No hypermetabolic adenopathy to suggest aggressive lymphoma. PET/CT is limited in evaluation for indolent lymphoma. 2.Decreased size of the hematoma along the left chest wall.Mildly increased uptake within the  right semitendinosus musculature and left lower quadrant abdominal wall, favored to relate to additional sites of contusion/hematoma.  06/2017 CT chest CONCLUSION:  Moderate bilateral pleural effusions have increased slightly.  Slight interval worsening of atelectasis associated with pleural effusions. The left lower lobe and lingula are densely opacified. Pneumonia or aspiration could be superimposed on atelectasis.  Unchanged extensive coarse pericardial calcification, likely related to remote hematoma. This places the patient at risk for constrictive physiology. There is a suggestion of distention of the inferior vena cava.  Patulous esophagus with fluid level in the mid thoracic esophagus, placing the patient risk for aspiration.  Interval development of small volume ascites.  Body wall edema has improved slightly. Left chest wall hematoma has improved slightly.  Suspected development of bilateral hemarthrosis in the partially visualized glenohumeral joints   06/2017 US guided lymph node biopsy  1.Technically successful U/S guided fine-needle aspiration of left inguinal lymph node. 2.No immediate complications. 3.Samples were sent to Pathology.   06/2017 CTA chest A/P  1. Large left posterolateral chest wall hematoma. Limited evaluation for active extravasation due to  incorrect bolus timing. If there are clinical findings of active extravasation, a follow-up chest CTA could be performed. 2. Numerous axillary, mediastinal, abdominal and pelvic lymph nodes. Bilateral inguinal lymph nodes and right pelvic lymph nodes are enlarged as described above. The differential includes lymphoma or infectious/inflammatory etiologies. Given the distribution, metastatic disease is felt to be less likely. 3. Extensive pericardial calcification which is associated with constrictive pericarditis. Echocardiogram may be helpful for further evaluation. 4. Small bilateral pleural effusions with  bibasilar atelectasis.    Assessment and Plan   1. Constrictive pericarditis - clinical data supports constrictive pericarditis including prior echo fidings, CT findings - he is down 80 lbs since I had seen him in the hospital in 07/2017, volume status dramatically improved - recent echo personally reviewed and despite report continues to support constrictive pericarditis. Did not show clear pericardial effusion as suggested by his recent CT A/P. He reports severe claustrophobia and not willing for cardiac MRI even with sedation, we will obtain cardiac CT to further clarify if there is a pericardial effusion - previously he was turned down by CT surgery at [facility] for pericardectomy consideration. He is down 80 lbs since then and fluid status much improved, may warrant reconsideration - we will refer to CHF clinic to help with management and consult for hemodynamic cath, if constrictive physiology confirmed would consider repeaet evaluation by CT surgery.  - repeat labs  2. 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 Lymph node biopsy 06/2017 was nondiagnostic. From heme/onc notes indolent lymphoma cannot be entirely ruled out. Has been clinically stable without recent bleeding.    - during admission at [facility] he received high dose dexamethasone and 1 dose of rituximab.   3. Chest wall hematoma - admitted to [facility] 06/2017 after a fall at home. Admitted with Hgb 6.9, abnormal PTT. Found toh ave a large extrathoracic hemoatoma long the left posterior chest and abdominal wall.  Later found to have acquired factor VIII inhibitor - 06/2017 echo at [facility] showed a well delineated mass that measured 7.5 x 3.5 cm that appeared to be in the pericardiaum compression the RV/RA, thought was this was related to the hematoma. Plans were for cardiac MRI but patient was too  dyspneic to lay down for procedure.        Arnoldo Lenis, M.D.

## 2017-12-16 NOTE — Addendum Note (Signed)
Addended by: Julian Hy T on: 12/16/2017 11:49 AM   Modules accepted: Orders

## 2017-12-16 NOTE — Patient Instructions (Signed)
Your physician recommends that you schedule a follow-up appointment in: 2 [place]  Your physician recommends that you continue on your current medications as directed. Please refer to the Current Medication list given to you today.  Your physician recommends that you return for lab work CMP/MG  Your physician has requested that you have cardiac CT. Cardiac computed tomography (CT) is a painless test that uses an x-ray machine to take clear, detailed pictures of your heart. For further information please visit [facility]. Please follow instruction sheet as given.  You have been referred to DR [place]  Thank you for choosing [facility]!!

## 2017-12-17 ENCOUNTER — Telehealth: Payer: Self-pay | Admitting: *Deleted

## 2017-12-17 LAB — COMPREHENSIVE METABOLIC PANEL
ALT: 14 IU/L (ref 0–44)
AST: 27 IU/L (ref 0–40)
Albumin/Globulin Ratio: 1 — ABNORMAL LOW (ref 1.2–2.2)
Albumin: 3.4 g/dL — ABNORMAL LOW (ref 3.6–4.8)
Alkaline Phosphatase: 155 IU/L — ABNORMAL HIGH (ref 39–117)
BUN / CREAT RATIO: 11 (ref 10–24)
BUN: 10 mg/dL (ref 8–27)
Bilirubin Total: 1.7 mg/dL — ABNORMAL HIGH (ref 0.0–1.2)
CO2: 27 mmol/L (ref 20–29)
Calcium: 8.8 mg/dL (ref 8.6–10.2)
Chloride: 92 mmol/L — ABNORMAL LOW (ref 96–106)
Creatinine, Ser: 0.9 mg/dL (ref 0.76–1.27)
GFR calc Af Amer: 103 mL/min/{1.73_m2} (ref >59)
GFR calc non Af Amer: 89 mL/min/{1.73_m2} (ref >59)
Globulin, Total: 3.4 g/dL (ref 1.5–4.5)
Glucose: 71 mg/dL (ref 65–99)
Potassium: 4 mmol/L (ref 3.5–5.2)
Sodium: 133 mmol/L — ABNORMAL LOW (ref 134–144)
Total Protein: 6.8 g/dL (ref 6.0–8.5)

## 2017-12-17 LAB — MAGNESIUM: Magnesium: 1.9 mg/dL (ref 1.6–2.3)

## 2017-12-17 NOTE — Telephone Encounter (Signed)
Sister Nicholes Rough informed.

## 2017-12-17 NOTE — Telephone Encounter (Signed)
-----   Message from Arnoldo Lenis, MD sent at 12/17/2017 12:50 PM EST ----- Labs look fine.   Zandra Abts MD

## 2017-12-19 ENCOUNTER — Other Ambulatory Visit: Payer: Self-pay | Admitting: Cardiology

## 2017-12-23 ENCOUNTER — Telehealth: Payer: Self-pay | Admitting: *Deleted

## 2017-12-23 NOTE — Telephone Encounter (Signed)
Pt was made aware and orders canceled - appt with Dr Marigene Ehlers 02/17/18   Please let patient know that I spoke with Dr Marigene Ehlers about his case, and he recommends seeing him in clinic first and likely pursuing cath before considering a CT scan of the heart. Please be sure the cardiac CT order has been cancelled    Zandra Abts MD

## 2017-12-25 ENCOUNTER — Telehealth: Payer: Self-pay | Admitting: Family Medicine

## 2017-12-25 ENCOUNTER — Telehealth: Payer: Self-pay | Admitting: *Deleted

## 2017-12-25 NOTE — Telephone Encounter (Signed)
We can definitely try to send in a prescription for gauze, just make it in the wider rolled gauze and give him a bunch of it.  You can maybe say give him a box of it however much comes in a box.  If he continues to have issues in the home care is not coming out any more than he may need to be seen

## 2017-12-25 NOTE — Telephone Encounter (Signed)
Aware.  Can purchase at pharmacy or medical supply stores.

## 2017-12-25 NOTE — Telephone Encounter (Signed)
Pt's legs are "weeping everywhere" all over his clothes and caregiver says the nurse is only coming out one more time to draw blood and she is concerned with what she is to do about his legs. She wanted rx for gauze to wrap his legs. I wasn't sure what type of gauze or if you needed to see pt again. In another phone encounter today the Helene Kelp from [facility] said his legs looked good?

## 2017-12-25 NOTE — Telephone Encounter (Signed)
FYI from [place] w/ [employer] Pt is being seen this week then discharged from [facility] next week Legs look good have no wounds, will be drawing labs today or tomorrow

## 2018-01-12 ENCOUNTER — Other Ambulatory Visit: Payer: Self-pay | Admitting: Family Medicine

## 2018-01-21 ENCOUNTER — Encounter: Payer: Self-pay | Admitting: Family Medicine

## 2018-01-21 ENCOUNTER — Ambulatory Visit (INDEPENDENT_AMBULATORY_CARE_PROVIDER_SITE_OTHER): Payer: 59 | Admitting: Family Medicine

## 2018-01-21 VITALS — BP 116/62 | HR 72 | Temp 97.8°F | Ht 71.0 in | Wt 196.4 lb

## 2018-01-21 DIAGNOSIS — E785 Hyperlipidemia, unspecified: Secondary | ICD-10-CM | POA: Diagnosis not present

## 2018-01-21 DIAGNOSIS — F5104 Psychophysiologic insomnia: Secondary | ICD-10-CM | POA: Diagnosis not present

## 2018-01-21 DIAGNOSIS — F325 Major depressive disorder, single episode, in full remission: Secondary | ICD-10-CM | POA: Diagnosis not present

## 2018-01-21 DIAGNOSIS — R609 Edema, unspecified: Secondary | ICD-10-CM | POA: Diagnosis not present

## 2018-01-21 MED ORDER — TRAZODONE HCL 50 MG PO TABS: 50.0000 mg | ORAL_TABLET | Freq: Every evening | ORAL | 3 refills | Status: DC | PRN

## 2018-01-21 NOTE — Progress Notes (Signed)
BP 116/62   Pulse 72   Temp 97.8 F (36.6 C) (Oral)   Ht '5\' 11"'  (1.803 m)   Wt 196 lb 6.4 oz (89.1 kg)   BMI 27.39 kg/m    Subjective:    Patient ID: Kent Bryant, male    DOB: 10/23/1950, 68 y.o.   MRN: [id number]  HPI: Kent Bryant is a 68 y.o. male presenting on 01/21/2018 for Hypertension (check of chronic medical problems); trouble sleeping (patient states it has been on going); and Edema (bilateral leg swelling and leaking fluid)   HPI Depression and difficulty sleeping Says his depression is doing better but he is just feeling like he cannot sleep and has a lot of difficulty sleeping currently.  He says he has trouble falling asleep but once he does get to sleep he usually does well staying asleep.  He says it has been going on for quite some time and he thinks maybe even over a year but it has been worsening recently over the past few months.  Patient is currently taking Zoloft and feels like it is doing well for him.  He would like something to help with sleep and he has not tried any medications except for Benadryl to help with sleep which he feels like did not help as much.  He denies any suicidal ideations or thoughts of hurting himself. Depression screen [facility] 2/9 01/21/2018 11/05/2017 08/28/2017 08/08/2017 06/18/2017  Decreased Interest 1 0 0 0 0  Down, Depressed, Hopeless 0 0 0 0 1  PHQ - 2 Score 1 0 0 0 1  Altered sleeping - - - - -  Tired, decreased energy - - - - -  Change in appetite - - - - -  Feeling bad or failure about yourself  - - - - -  Trouble concentrating - - - - -  Moving slowly or fidgety/restless - - - - -  Suicidal thoughts - - - - -  PHQ-9 Score - - - - -  Some recent data might be hidden    Hyperlipidemia Patient is coming in for recheck of his hyperlipidemia. The patient is currently taking a Lipitor. They deny any issues with myalgias or history of liver damage from it. They deny any focal numbness or weakness or chest pain.   Patient continues to fight  peripheral edema and swelling in his legs.  It is improved from where it has been previously but he is starting to have some weeping.  He does openly admit that he has not been wearing his compression stockings like he should although he does keep his legs elevated at night.  He does wrap them sometimes but only when they start getting bad.  Patient denies any redness or swelling.  Relevant past medical, surgical, family and social history reviewed and updated as indicated. Interim medical history since our last visit reviewed. Allergies and medications reviewed and updated.  Review of Systems  Constitutional: Negative for chills and fever.  Respiratory: Negative for shortness of breath and wheezing.   Cardiovascular: Positive for leg swelling. Negative for chest pain.  Musculoskeletal: Negative for back pain and gait problem.  Skin: Negative for rash.  Psychiatric/Behavioral: Positive for dysphoric mood and sleep disturbance. Negative for self-injury and suicidal ideas. The patient is not nervous/anxious.   All other systems reviewed and are negative.   Per HPI unless specifically indicated above   Allergies as of 01/21/2018   No Known Allergies     Medication List  Accurate as of January 21, 2018 11:59 PM. Always use your most recent med list.        atorvastatin 40 MG tablet Commonly known as:  LIPITOR Take 1 tablet (40 mg total) by mouth daily at 6 PM.   cholecalciferol 1000 units tablet Commonly known as:  VITAMIN D Take 2,000 Units by mouth daily.   ferrous sulfate 325 (65 FE) MG tablet Take 1 tablet (325 mg total) by mouth 2 (two) times daily with a meal.   folic acid 1 MG tablet Commonly known as:  FOLVITE Take 1 tablet by mouth daily at 2 PM.   furosemide 20 MG tablet Commonly known as:  LASIX Take 3 tablets (60 mg total) by mouth 2 (two) times daily.   gabapentin 300 MG capsule Commonly known as:  NEURONTIN Take 2 capsules (600 mg total) by mouth 2  (two) times daily.   KLOR-CON M20 20 MEQ tablet Generic drug:  potassium chloride SA TAKE 4 TABLETS BY MOUTH 2 TIMES DAILY   lactulose 10 GM/15ML solution Commonly known as:  CHRONULAC Take 15 mLs (10 g total) by mouth 3 (three) times daily.   sertraline 50 MG tablet Commonly known as:  ZOLOFT Take 1 tablet (50 mg total) by mouth daily.   spironolactone 25 MG tablet Commonly known as:  ALDACTONE Take 1 tablet (25 mg total) by mouth 2 (two) times daily.   traZODone 50 MG tablet Commonly known as:  DESYREL Take 1-2 tablets (50-100 mg total) by mouth at bedtime as needed for sleep.          Objective:    BP 116/62   Pulse 72   Temp 97.8 F (36.6 C) (Oral)   Ht '5\' 11"'  (1.803 m)   Wt 196 lb 6.4 oz (89.1 kg)   BMI 27.39 kg/m   Wt Readings from Last 3 Encounters:  01/21/18 196 lb 6.4 oz (89.1 kg)  12/16/17 200 lb (90.7 kg)  11/12/17 190 lb 9.6 oz (86.5 kg)    Physical Exam Vitals signs and nursing note reviewed.  Constitutional:      General: He is not in acute distress.    Appearance: He is well-developed. He is not diaphoretic.  Eyes:     General: No scleral icterus.    Conjunctiva/sclera: Conjunctivae normal.  Neck:     Musculoskeletal: Neck supple.     Thyroid: No thyromegaly.  Cardiovascular:     Rate and Rhythm: Normal rate and regular rhythm.     Heart sounds: Normal heart sounds. No murmur.  Pulmonary:     Effort: Pulmonary effort is normal. No respiratory distress.     Breath sounds: Normal breath sounds. No wheezing.  Musculoskeletal: Normal range of motion.        General: Swelling (2+ pitting edema in bilateral lower extremities no wounds or erythema noted today, small amount of weeping on the calves) present.  Lymphadenopathy:     Cervical: No cervical adenopathy.  Skin:    General: Skin is warm and dry.     Findings: No rash.  Neurological:     Mental Status: He is alert and oriented to person, place, and time.     Coordination: Coordination  normal.  Psychiatric:        Behavior: Behavior normal.         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 Will start trazodone for insomnia and continue other medication currently including Zoloft.  Will check blood work today as well. Follow up plan: Return in about 3 months (around 04/22/2018), or if symptoms worsen or fail to improve, for Recheck insomnia and edema.  Counseling provided for all of the vaccine components Orders Placed This Encounter  Procedures  . Compression stockings  . CBC with Differential/Platelet  . CMP14+EGFR  . Lipid panel    Caryl Pina, MD [facility] 01/25/2018, 10:05 PM

## 2018-01-22 LAB — CBC WITH DIFFERENTIAL/PLATELET
BASOS: 1 %
Basophils Absolute: 0.1 10*3/uL (ref 0.0–0.2)
EOS (ABSOLUTE): 0.2 10*3/uL (ref 0.0–0.4)
Eos: 3 %
Hematocrit: 40.3 % (ref 37.5–51.0)
Hemoglobin: 13.4 g/dL (ref 13.0–17.7)
Immature Grans (Abs): 0 10*3/uL (ref 0.0–0.1)
Immature Granulocytes: 1 %
Lymphocytes Absolute: 1.4 10*3/uL (ref 0.7–3.1)
Lymphs: 22 %
MCH: 30.9 pg (ref 26.6–33.0)
MCHC: 33.3 g/dL (ref 31.5–35.7)
MCV: 93 fL (ref 79–97)
Monocytes Absolute: 0.9 10*3/uL (ref 0.1–0.9)
Monocytes: 13 %
Neutrophils Absolute: 4 10*3/uL (ref 1.4–7.0)
Neutrophils: 60 %
Platelets: 149 10*3/uL — ABNORMAL LOW (ref 150–450)
RBC: 4.34 x10E6/uL (ref 4.14–5.80)
RDW: 13.9 % (ref 11.6–15.4)
WBC: 6.6 10*3/uL (ref 3.4–10.8)

## 2018-01-22 LAB — CMP14+EGFR
A/G RATIO: 1.1 — AB (ref 1.2–2.2)
ALT: 18 IU/L (ref 0–44)
AST: 31 IU/L (ref 0–40)
Albumin: 3.5 g/dL — ABNORMAL LOW (ref 3.6–4.8)
Alkaline Phosphatase: 142 IU/L — ABNORMAL HIGH (ref 39–117)
BUN/Creatinine Ratio: 13 (ref 10–24)
BUN: 12 mg/dL (ref 8–27)
Bilirubin Total: 1.2 mg/dL (ref 0.0–1.2)
CO2: 28 mmol/L (ref 20–29)
Calcium: 9.1 mg/dL (ref 8.6–10.2)
Chloride: 94 mmol/L — ABNORMAL LOW (ref 96–106)
Creatinine, Ser: 0.93 mg/dL (ref 0.76–1.27)
GFR calc Af Amer: 99 mL/min/{1.73_m2} (ref >59)
GFR calc non Af Amer: 85 mL/min/{1.73_m2} (ref >59)
Globulin, Total: 3.2 g/dL (ref 1.5–4.5)
Glucose: 100 mg/dL — ABNORMAL HIGH (ref 65–99)
POTASSIUM: 4.5 mmol/L (ref 3.5–5.2)
Sodium: 135 mmol/L (ref 134–144)
Total Protein: 6.7 g/dL (ref 6.0–8.5)

## 2018-01-22 LAB — LIPID PANEL
Chol/HDL Ratio: 2.7 ratio (ref 0.0–5.0)
Cholesterol, Total: 104 mg/dL (ref 100–199)
HDL: 38 mg/dL — ABNORMAL LOW (ref >39)
LDL Calculated: 57 mg/dL (ref 0–99)
Triglycerides: 47 mg/dL (ref 0–149)
VLDL Cholesterol Cal: 9 mg/dL (ref 5–40)

## 2018-02-01 ENCOUNTER — Other Ambulatory Visit: Payer: Self-pay | Admitting: Family Medicine

## 2018-02-05 ENCOUNTER — Ambulatory Visit: Payer: 59 | Admitting: Family Medicine

## 2018-02-17 ENCOUNTER — Encounter (HOSPITAL_COMMUNITY): Payer: 59 | Admitting: Cardiology

## 2018-02-20 ENCOUNTER — Ambulatory Visit (INDEPENDENT_AMBULATORY_CARE_PROVIDER_SITE_OTHER): Payer: 59 | Admitting: Family Medicine

## 2018-02-20 ENCOUNTER — Encounter: Payer: Self-pay | Admitting: Family Medicine

## 2018-02-20 VITALS — BP 125/66 | HR 81 | Temp 97.5°F | Ht 71.0 in | Wt 200.6 lb

## 2018-02-20 DIAGNOSIS — J418 Mixed simple and mucopurulent chronic bronchitis: Secondary | ICD-10-CM | POA: Diagnosis not present

## 2018-02-20 DIAGNOSIS — F411 Generalized anxiety disorder: Secondary | ICD-10-CM

## 2018-02-20 DIAGNOSIS — E785 Hyperlipidemia, unspecified: Secondary | ICD-10-CM | POA: Diagnosis not present

## 2018-02-20 DIAGNOSIS — G609 Hereditary and idiopathic neuropathy, unspecified: Secondary | ICD-10-CM

## 2018-02-20 DIAGNOSIS — Z6837 Body mass index (BMI) 37.0-37.9, adult: Secondary | ICD-10-CM

## 2018-02-20 DIAGNOSIS — F319 Bipolar disorder, unspecified: Secondary | ICD-10-CM

## 2018-02-20 MED ORDER — SERTRALINE HCL 50 MG PO TABS: 50.0000 mg | ORAL_TABLET | Freq: Every day | ORAL | 3 refills | Status: AC

## 2018-02-20 MED ORDER — SPIRONOLACTONE 25 MG PO TABS: 25.0000 mg | ORAL_TABLET | Freq: Two times a day (BID) | ORAL | 3 refills | Status: AC

## 2018-02-20 MED ORDER — FERROUS SULFATE 325 (65 FE) MG PO TABS: 325.0000 mg | ORAL_TABLET | Freq: Two times a day (BID) | ORAL | 3 refills | Status: AC

## 2018-02-20 MED ORDER — GABAPENTIN 300 MG PO CAPS: 600.0000 mg | ORAL_CAPSULE | Freq: Two times a day (BID) | ORAL | 5 refills | Status: DC

## 2018-02-20 MED ORDER — FOLIC ACID 1 MG PO TABS: 1.0000 mg | ORAL_TABLET | Freq: Every day | ORAL | 3 refills | Status: AC

## 2018-02-20 MED ORDER — GABAPENTIN 300 MG PO CAPS: 600.0000 mg | ORAL_CAPSULE | Freq: Three times a day (TID) | ORAL | 5 refills | Status: DC

## 2018-02-20 MED ORDER — ATORVASTATIN CALCIUM 40 MG PO TABS: 40.0000 mg | ORAL_TABLET | Freq: Every day | ORAL | 3 refills | Status: AC

## 2018-02-20 MED ORDER — FUROSEMIDE 20 MG PO TABS: 60.0000 mg | ORAL_TABLET | Freq: Two times a day (BID) | ORAL | 5 refills | Status: DC

## 2018-02-20 NOTE — Progress Notes (Signed)
BP 125/66   Pulse 81   Temp (!) 97.5 F (36.4 C) (Oral)   Ht _0  (1.803 m)   Wt 200 lb 9.6 oz (91 kg)   BMI 27.98 kg/m    Subjective:    Patient ID: Tkai Serfass, male    DOB: Sep 01, 1950, 68 y.o.   MRN: [id number]  HPI: Jourdin Connors is a 68 y.o. male presenting on 02/20/2018 for Hyperlipidemia (3 month follow up of chronic medical conditions); Hypertension; Depression; Insomnia; and Edema (bilateral legs)   HPI COPD Patient is coming in for COPD recheck today.  He is currently on no inhalers.  He has a mild chronic cough but denies any major coughing spells or wheezing spells.  He has 0nighttime symptoms per week and 0daytime symptoms per week currently.   Hyperlipidemia Patient is coming in for recheck of his hyperlipidemia. The patient is currently taking a tour. They deny any issues with myalgias or history of liver damage from it. They deny any focal numbness or weakness or chest pain.   Anxiety and bipolar and peripheral neuropathy refill of medication.  He says all the medications are doing well for him and is pretty happy with his mood and says his neuropathy is pretty controlled. Depression screen [facility] 2/9 02/20/2018 01/21/2018 11/05/2017 08/28/2017 08/08/2017  Decreased Interest 0 1 0 0 0  Down, Depressed, Hopeless 0 0 0 0 0  PHQ - 2 Score 0 1 0 0 0  Altered sleeping - - - - -  Tired, decreased energy - - - - -  Change in appetite - - - - -  Feeling bad or failure about yourself  - - - - -  Trouble concentrating - - - - -  Moving slowly or fidgety/restless - - - - -  Suicidal thoughts - - - - -  PHQ-9 Score - - - - -  Some recent data might be hidden     Relevant past medical, surgical, family and social history reviewed and updated as indicated. Interim medical history since our last visit reviewed. Allergies and medications reviewed and updated.  Review of Systems  Constitutional: Negative for chills and fever.  Eyes: Negative for visual disturbance.  Respiratory:  Negative for shortness of breath and wheezing.   Cardiovascular: Negative for chest pain and leg swelling.  Musculoskeletal: Negative for back pain and gait problem.  Skin: Negative for rash.  Neurological: Positive for numbness.  Psychiatric/Behavioral: Positive for decreased concentration. Negative for self-injury, sleep disturbance and suicidal ideas. The patient is nervous/anxious.   All other systems reviewed and are negative.   Per HPI unless specifically indicated above   Allergies as of 02/20/2018   No Known Allergies     Medication List       Accurate as of February 20, 2018  3:14 PM. Always use your most recent med list.        atorvastatin 40 MG tablet Commonly known as:  LIPITOR Take 1 tablet (40 mg total) by mouth daily at 6 PM.   cholecalciferol 1000 units tablet Commonly known as:  VITAMIN D Take 2,000 Units by mouth daily.   ferrous sulfate 325 (65 FE) MG tablet Take 1 tablet (325 mg total) by mouth 2 (two) times daily with a meal.   folic acid 1 MG tablet Commonly known as:  FOLVITE Take 1 tablet by mouth daily at 2 PM.   furosemide 20 MG tablet Commonly known as:  LASIX Take 3 tablets (60 mg total)  by mouth 2 (two) times daily.   gabapentin 300 MG capsule Commonly known as:  NEURONTIN Take 2 capsules (600 mg total) by mouth 2 (two) times daily.   KLOR-CON M20 20 MEQ tablet Generic drug:  potassium chloride SA TAKE 4 TABLETS BY MOUTH 2 TIMES DAILY   lactulose 10 GM/15ML solution Commonly known as:  CHRONULAC Take 15 mLs (10 g total) by mouth 3 (three) times daily.   sertraline 50 MG tablet Commonly known as:  ZOLOFT Take 1 tablet (50 mg total) by mouth daily.   spironolactone 25 MG tablet Commonly known as:  ALDACTONE Take 1 tablet (25 mg total) by mouth 2 (two) times daily.   traZODone 50 MG tablet Commonly known as:  DESYREL Take 1-2 tablets (50-100 mg total) by mouth at bedtime as needed for sleep.          Objective:    BP  125/66   Pulse 81   Temp (!) 97.5 F (36.4 C) (Oral)   Ht _0  (1.803 m)   Wt 200 lb 9.6 oz (91 kg)   BMI 27.98 kg/m   Wt Readings from Last 3 Encounters:  02/20/18 200 lb 9.6 oz (91 kg)  01/21/18 196 lb 6.4 oz (89.1 kg)  12/16/17 200 lb (90.7 kg)    Physical Exam Vitals signs and nursing note reviewed.  Constitutional:      General: He is not in acute distress.    Appearance: He is well-developed. He is not diaphoretic.  Eyes:     General: No scleral icterus.    Conjunctiva/sclera: Conjunctivae normal.  Neck:     Musculoskeletal: Neck supple.     Thyroid: No thyromegaly.  Cardiovascular:     Rate and Rhythm: Normal rate and regular rhythm.     Heart sounds: Normal heart sounds. No murmur.  Pulmonary:     Effort: Pulmonary effort is normal. No respiratory distress.     Breath sounds: Normal breath sounds. No wheezing.  Musculoskeletal: Normal range of motion.        General: Swelling present.  Lymphadenopathy:     Cervical: No cervical adenopathy.  Skin:    General: Skin is warm and dry.     Findings: No rash.  Neurological:     Mental Status: He is alert and oriented to person, place, and time.     Coordination: Coordination normal.  Psychiatric:        Behavior: Behavior normal.         Assessment & Plan:   Problem List Items Addressed This Visit      Respiratory   COPD (chronic obstructive pulmonary disease) (HCC)     Nervous and Auditory   Peripheral neuropathy, idiopathic   Relevant Medications   sertraline (ZOLOFT) 50 MG tablet   gabapentin (NEURONTIN) 300 MG capsule   Other Relevant Orders   CMP14+EGFR (Completed)     Other   GAD (generalized anxiety disorder)   Relevant Medications   sertraline (ZOLOFT) 50 MG tablet   Other Relevant Orders   CBC with Differential/Platelet (Completed)   CMP14+EGFR (Completed)   Hyperlipidemia with target LDL less than 130 - Primary   Relevant Medications   atorvastatin (LIPITOR) 40 MG tablet    furosemide (LASIX) 20 MG tablet   spironolactone (ALDACTONE) 25 MG tablet   BMI 37.0-37.9, adult   Bipolar disorder (HCC)      New current meds including Zoloft and gabapentin atorvastatin Lasix and Aldactone, Follow up plan: Return in about 3 months (  around 05/21/2018), or if symptoms worsen or fail to improve, for anxiety and cholesterol.  Counseling provided for all of the vaccine components No orders of the defined types were placed in this encounter.   Caryl Pina, MD [facility] 02/20/2018, 3:14 PM

## 2018-02-21 LAB — CMP14+EGFR
ALT: 15 IU/L (ref 0–44)
AST: 27 IU/L (ref 0–40)
Albumin/Globulin Ratio: 1.2 (ref 1.2–2.2)
Albumin: 3.5 g/dL — ABNORMAL LOW (ref 3.8–4.8)
Alkaline Phosphatase: 132 IU/L — ABNORMAL HIGH (ref 39–117)
BUN/Creatinine Ratio: 13 (ref 10–24)
BUN: 13 mg/dL (ref 8–27)
Bilirubin Total: 1.4 mg/dL — ABNORMAL HIGH (ref 0.0–1.2)
CHLORIDE: 95 mmol/L — AB (ref 96–106)
CO2: 28 mmol/L (ref 20–29)
Calcium: 9.2 mg/dL (ref 8.6–10.2)
Creatinine, Ser: 0.97 mg/dL (ref 0.76–1.27)
GFR calc Af Amer: 94 mL/min/{1.73_m2} (ref >59)
GFR calc non Af Amer: 81 mL/min/{1.73_m2} (ref >59)
Globulin, Total: 3 g/dL (ref 1.5–4.5)
Glucose: 88 mg/dL (ref 65–99)
Potassium: 4.6 mmol/L (ref 3.5–5.2)
Sodium: 136 mmol/L (ref 134–144)
Total Protein: 6.5 g/dL (ref 6.0–8.5)

## 2018-02-21 LAB — CBC WITH DIFFERENTIAL/PLATELET
Basophils Absolute: 0 10*3/uL (ref 0.0–0.2)
Basos: 1 %
EOS (ABSOLUTE): 0.2 10*3/uL (ref 0.0–0.4)
Eos: 3 %
Hematocrit: 40.4 % (ref 37.5–51.0)
Hemoglobin: 13.7 g/dL (ref 13.0–17.7)
IMMATURE GRANS (ABS): 0 10*3/uL (ref 0.0–0.1)
Immature Granulocytes: 0 %
LYMPHS: 20 %
Lymphocytes Absolute: 1.3 10*3/uL (ref 0.7–3.1)
MCH: 30.9 pg (ref 26.6–33.0)
MCHC: 33.9 g/dL (ref 31.5–35.7)
MCV: 91 fL (ref 79–97)
Monocytes Absolute: 0.9 10*3/uL (ref 0.1–0.9)
Monocytes: 13 %
NEUTROS PCT: 63 %
Neutrophils Absolute: 4.1 10*3/uL (ref 1.4–7.0)
Platelets: 133 10*3/uL — ABNORMAL LOW (ref 150–450)
RBC: 4.43 x10E6/uL (ref 4.14–5.80)
RDW: 13.8 % (ref 11.6–15.4)
WBC: 6.5 10*3/uL (ref 3.4–10.8)

## 2018-03-04 ENCOUNTER — Ambulatory Visit: Payer: 59 | Admitting: Cardiology

## 2018-03-10 ENCOUNTER — Telehealth: Payer: Self-pay | Admitting: *Deleted

## 2018-03-10 NOTE — Telephone Encounter (Signed)
-----   Message from Arnoldo Lenis, MD sent at 03/04/2018  2:46 PM EST ----- Can we tough base with patient. We had referred him to Dr Marigene Ehlers at [facility], I see the patient cancelled the appointment. Very important that he reschedule   Zandra Abts MD

## 2018-03-10 NOTE — Telephone Encounter (Signed)
Spoke with sister Inez Catalina and she says that patient refused to go to CHF appointment. Encouraged sister that its very important that he is seen and number given to call and reschedule appointment with Dr. Aundra Dubin. Sister says she is going to try to encourage patient to reschedule.

## 2018-03-23 ENCOUNTER — Encounter: Payer: 59 | Admitting: Family Medicine

## 2018-04-13 ENCOUNTER — Encounter: Payer: 59 | Admitting: Family Medicine

## 2018-04-30 ENCOUNTER — Telehealth: Payer: Self-pay | Admitting: *Deleted

## 2018-04-30 NOTE — Telephone Encounter (Signed)
Pt sister (DPR) verbalized consent for telehealth appt with Dr Harl Bowie on 05/05/18. Pt meds/allergies/pharmacy reviewed. Doesn't have a way to check HR/BP but will have weight available. Pt sister says pt has been gaining weight and fluid and will discuss with Dr Harl Bowie on Tuesday

## 2018-05-05 ENCOUNTER — Encounter: Payer: Self-pay | Admitting: Cardiology

## 2018-05-05 ENCOUNTER — Telehealth (INDEPENDENT_AMBULATORY_CARE_PROVIDER_SITE_OTHER): Payer: 59 | Admitting: Cardiology

## 2018-05-05 VITALS — BP 101/63 | HR 133 | Ht 71.0 in | Wt 221.0 lb

## 2018-05-05 DIAGNOSIS — I311 Chronic constrictive pericarditis: Secondary | ICD-10-CM | POA: Diagnosis not present

## 2018-05-05 MED ORDER — FUROSEMIDE 80 MG PO TABS: 80.0000 mg | ORAL_TABLET | Freq: Two times a day (BID) | ORAL | 1 refills | Status: DC

## 2018-05-05 NOTE — Progress Notes (Signed)
Virtual Visit via Telephone Note   This visit type was conducted due to national recommendations for restrictions regarding the COVID-19 Pandemic (e.g. social distancing) in an effort to limit this patient's exposure and mitigate transmission in our community.  Due to his co-morbid illnesses, this patient is at least at moderate risk for complications without adequate follow up.  This format is felt to be most appropriate for this patient at this time.  The patient did not have access to video technology/had technical difficulties with video requiring transitioning to audio format only (telephone).  All issues noted in this document were discussed and addressed.  No physical exam could be performed with this format.  Please refer to the patient's chart for his  consent to telehealth for [facility].   Evaluation Performed:  Follow-up visit  Date:  05/05/2018   ID:  Kent Bryant, DOB 07-14-1950, MRN [id number]  Patient Location: Home Provider Location: Home  PCP:  Dettinger, Fransisca Kaufmann, MD  Cardiologist:  Carlyle Dolly, MD  Electrophysiologist:  None   Chief Complaint:  SOB  History of Present Illness:    Kent Bryant is a 68 y.o. male seen today for follow up of the following medical problems.   1. Constrictive pericarditis - patient previously followed at [facility] - we had seen him briefly during admission 07/2017 at [facility] presenting with anasarca ans signs of liver congestion - echo findings were concerning for constrictive pericarditis. Previous imaging at [facility] 06/2017 was reviewed and showed evidence of pericardial calcification. He was transferred to [facility] that admission since that is where he followed - from notes turned down by CT surgery for consideration for possible pericardectomy at the time due to comorbidities.  -weights 270 during 07/2017 admission, down to 190 lbs over last few months - home weights 190-192, no LE edema. Stable breathing - compliant with  diuretics.     - he cancelled his appointment with CHF clinic, has not been willing to reschedule - no recent SOB or DOE. Sedentary lifestyle.  - some recent leg swelling. Wraps legs with some improvement. Some abdominal distension - taking lasix 60mg  bid.  02/2018 pcp weight 200 lbs     2. Portal hypertension - followed by GI. 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 Chest wall hematoma - 06/2017 CT at [facility] showed larger left posterolateral chest wall hematoma.    5. Lymphadenopathy - 06/2017 CTA chest showed numerous axillary, mediastinal, abomdinal, and pelvic lymph nodes - appears had lymph node biospy done at [facility] nondiagnostic   The patient does not have symptoms concerning for COVID-19 infection (fever, chills, cough, or new shortness of breath).    Past Medical History:  Diagnosis Date  . Abnormal CXR (chest x-ray)   . Agoraphobia   . Anxiety   . Bipolar disorder ([place])   . Chronic low back pain   . Coagulopathy ([place])   . COPD (chronic obstructive pulmonary disease) ([place])   . Depression   . Depression   . Hemophilia ([place])   . Insomnia   . Osteopenia   . Peripheral edema   . Peripheral neuropathy   . Peripheral neuropathy   . Vitamin D deficiency    Past Surgical History:  Procedure Laterality Date  . CATARACT EXTRACTION  05/29/2017  . CATARACT EXTRACTION W/PHACO Left 06/13/2017   Procedure: CATARACT EXTRACTION PHACO AND INTRAOCULAR LENS PLACEMENT LEFT EYE;  Surgeon: Baruch Goldmann, MD;  Location: [facility];  Service: Ophthalmology;  Laterality: Left;  CDE: 23.32     No outpatient medications have been marked as taking for the 05/05/18 encounter (Appointment) with Arnoldo Lenis, MD.     Allergies:   Patient has no known allergies.   Social History   Tobacco Use  . Smoking status: Former Smoker    Packs/day: 0.50    Years: 15.00    Pack  years: 7.50    Types: Cigarettes    Last attempt to quit: 07/07/2017    Years since quitting: 0.8  . Smokeless tobacco: Never Used  Substance Use Topics  . Alcohol use: No  . Drug use: No     Family Hx: The patient's family history includes Stroke in his mother. There is no history of Colon cancer, Esophageal cancer, Liver disease, Pancreatic cancer, or Stomach cancer.  ROS:   Please see the history of present illness.     All other systems reviewed and are negative.   Prior CV studies:   The following studies were reviewed today:  07/2017 echo Study Conclusions  - Limited echo to evaluate further for constrictive physiology. - Left ventricle: Systolic function was normal. The estimated ejection fraction was in the range of 60% to 65%. There is a septal bounce present. - There is 25% respiratory variation across the MV and 30% across the TV. Medial e&' 16, lateral e&' 10, E/A ratio 3.2 and decel time 150. IVC is dilated and fixed. There is respiratory variation across the hepatic vein. - Findings consistent with constrictive physiology.   07/2017 echo Study Conclusions  - Left ventricle: The cavity size was normal. Wall thickness was normal. Systolic function was normal. The estimated ejection fraction was in the range of 55% to 60%. Mixed diastolic findings. High E/A ratio with borderline low deceleration time, blunting of pulmonary venous flow, normal left atrium, normal tissue velocities. There is a septal bounce. - Aortic valve: Valve area (VTI): 1.88 cm^2. Valve area (Vmax): 1.86 cm^2. Valve area (Vmean): 1.77 cm^2. - Mitral valve: There was mild regurgitation. - Pulmonary veins: There is systolic blunting of pulmonary vein flow consistent with elevated LA pressure. - Right ventricle: There is intermittent flattening of the ventricular septum that appears to vary with the respriatory cycle. - There are some signs concerning for  possible constrictive pericarditis. Will order repeat limited study to evaluate respiratory variation of inflow as well as repeat mitral inflow, tissue velocities, obtain hepatic flow Dopplers.  10/2017 CT A/P IMPRESSION: 1. Although there is only subtle lobularity of the hepatic margin, there are considerable findings of portal venous hypertension including uphill paraesophageal varices, recanalization of the umbilical vein, and splenorenal shunting. These factors raise suspicion for underlying cirrhotic liver disease. 2. Contracted gallbladder with small gallstones. 3. Third spacing of fluid with mesenteric and subcutaneous edema, upper abdominal ascites, and right greater than left pleural effusions. 4. Findings of prominent prior calcific pericarditis, with dense sheets of calcification along the pericardial margins. These outline several collections of complex fluid including a 97 cubic cm anterior inferior pericardial fluid collection. 5. Pelvic adenopathy in addition to mildly enlarged retroperitoneal adenopathy. While some of this could well be reactive adenopathy from cirrhosis, the pelvic adenopathy in particular is nonspecific and entity such as lymphoma are not readily excluded based on imaging. 6. Impingement at L4-5 and A1-K5. 7. Umbilical hernia containing adipose tissue and vessels.   [facility] studies 07/2017 echo SUMMARY The left ventricular size  is normal. There is normal left ventricular wall thickness.  Left ventricular systolic function is normal. LV ejection fraction = 65-70%. No segmental wall motion abnormalities seen in the left ventricle. Septal bounce is noted.  Left ventricular filling pattern is indeterminate. The right ventricular cavity is small on RV focused A4c views due to  extrinsic compression from a bright echodensity better visualized by prior CT  chests. The right ventricular systolic function is normal. The left atrium is  mildly dilated.  Right atrial size is normal with no evidence of collapse on diastole. There is mild aortic and mitral regurgitation. The IVC is mildly dilated with an abnormal collapsibility index, this  suggestive of increased right atrial pressure. There is a ascites present. Prior study, dated 06/26/17, is a limited study, however no significant change  is noted.   06/2017 CT PET 1.No hypermetabolic adenopathy to suggest aggressive lymphoma. PET/CT is limited in evaluation for indolent lymphoma. 2.Decreased size of the hematoma along the left chest wall.Mildly increased uptake within the right semitendinosus musculature and left lower quadrant abdominal wall, favored to relate to additional sites of contusion/hematoma.  06/2017 CT chest CONCLUSION:  Moderate bilateral pleural effusions have increased slightly.  Slight interval worsening of atelectasis associated with pleural effusions. The left lower lobe and lingula are densely opacified. Pneumonia or aspiration could be superimposed on atelectasis.  Unchanged extensive coarse pericardial calcification, likely related to remote hematoma. This places the patient at risk for constrictive physiology. There is a suggestion of distention of the inferior vena cava.  Patulous esophagus with fluid level in the mid thoracic esophagus, placing the patient risk for aspiration.  Interval development of small volume ascites.  Body wall edema has improved slightly. Left chest wall hematoma has improved slightly.  Suspected development of bilateral hemarthrosis in the partially visualized glenohumeral joints   06/2017 US guided lymph node biopsy  1.Technically successful U/S guided fine-needle aspiration of left inguinal lymph node. 2.No immediate complications. 3.Samples were sent to Pathology.   06/2017 CTA chest A/P 1. Large left posterolateral chest wall hematoma. Limited evaluation for active extravasation due to   incorrect bolus timing. If there are clinical findings of active extravasation, a follow-up chest CTA could be performed. 2. Numerous axillary, mediastinal, abdominal and pelvic lymph nodes. Bilateral inguinal lymph nodes and right pelvic lymph nodes are enlarged as described above. The differential includes lymphoma or infectious/inflammatory etiologies. Given the distribution, metastatic disease is felt to be less likely. 3. Extensive pericardial calcification which is associated with constrictive pericarditis. Echocardiogram may be helpful for further evaluation. 4. Small bilateral pleural effusions with bibasilar atelectasis.  Labs/Other Tests and Data Reviewed:    EKG:  na  Recent Labs: 07/22/2017: B Natriuretic Peptide 326.0 09/19/2017: Pro B Natriuretic peptide (BNP) 312.0; TSH 2.20 12/16/2017: Magnesium 1.9 02/20/2018: ALT 15; BUN 13; Creatinine, Ser 0.97; Hemoglobin 13.7; Platelets 133; Potassium 4.6; Sodium 136   Recent Lipid Panel Lab Results  Component Value Date/Time   CHOL 104 01/21/2018 04:32 PM   TRIG 47 01/21/2018 04:32 PM   TRIG 131 06/08/2014 10:24 AM   HDL 38 (L) 01/21/2018 04:32 PM   HDL 27 (L) 06/08/2014 10:24 AM   CHOLHDL 2.7 01/21/2018 04:32 PM   LDLCALC 57 01/21/2018 04:32 PM   LDLCALC 90 10/21/2013 03:54 PM    Wt Readings from Last 3 Encounters:  02/20/18 200 lb 9.6 oz (91 kg)  01/21/18 196 lb 6.4 oz (89.1 kg)  12/16/17 200 lb (90.7 kg)     Objective:  Vital Signs:   Today's Vitals   05/05/18 1315  BP: 101/63  Pulse: (!) 133  Weight: 221 lb (100.2 kg)  Height: 5\' 11"  (1.803 m)   Body mass index is 30.82 kg/m.   Repeat HR with new monitor was 98  ASSESSMENT & PLAN:     1. Constrictive pericarditis - clinical data supports constrictive pericarditis including prior echo fidings, CT findings - he is down 80 lbs since I had seen him in the hospital in 07/2017, volume status dramatically improved - echo personally reviewed and despite report  continues to support constrictive pericarditis. Did not show clear pericardial effusion as suggested by his recent CT A/P. He reports severe claustrophobia and not willing for cardiac MRI even with sedation, we will obtain cardiac CT to further clarify if there is a pericardial effusion - previously he was turned down by CT surgery at [facility] for pericardectomy consideration. He is down 80 lbs since then and fluid status much improved, may warrant reconsideration  - he is reluctant to see CHF clinic or consider surgical evaluation - increase lasix to 80mg  daily, update [place] with his weights in 1 week.   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 Lymph node biopsy 06/2017 was nondiagnostic. From heme/onc notes indolent lymphoma cannot be entirely ruled out. Has been clinically stable without recent bleeding.  - during admission at [facility] he received high dose dexamethasone and 1 dose of rituximab.   3. Chest wall hematoma - admitted to [facility] 06/2017 after a fall at home. Admitted with Hgb 6.9, abnormal PTT. Found toh ave a large extrathoracic hemoatoma long the left posterior chest and abdominal wall. 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 Plans were for cardiac MRI but patient was too dyspneic to lay down for procedure.     COVID-19 Education: The signs and symptoms of COVID-19 were discussed with the patient and how to seek care for testing (follow up with PCP or arrange E-visit).  The importance of social distancing was discussed today.  Time:   Today, I  have spent 15 minutes with the patient with telehealth technology discussing the above problems.     Medication Adjustments/Labs and Tests Ordered: Current medicines are reviewed at length with the patient today.  Concerns regarding medicines are outlined above.   Tests Ordered: No orders of the defined types were placed in this encounter.   Medication Changes: No orders of the defined types were placed in this encounter.   Disposition:  Follow up   SignedCarlyle Dolly, MD  05/05/2018 12:54 PM    [place]

## 2018-05-05 NOTE — Patient Instructions (Signed)
Your physician recommends that you schedule a follow-up appointment in: [place] physician has recommended you make the following change in your medication:   INCREASE LASIX 80 MG TWICE DAILY   PLEASE UPDATE [place] IN 1 WEEK WITH DAILY WEIGHTS AND 3 DAYS OF HEART RATES WHILE SITTING FOR AT LEAST 5 MINUTES.   Thank you for choosing [facility]!!

## 2018-05-11 ENCOUNTER — Other Ambulatory Visit: Payer: Self-pay

## 2018-05-11 ENCOUNTER — Encounter: Payer: Self-pay | Admitting: Family Medicine

## 2018-05-11 ENCOUNTER — Ambulatory Visit (INDEPENDENT_AMBULATORY_CARE_PROVIDER_SITE_OTHER): Payer: 59 | Admitting: Family Medicine

## 2018-05-11 DIAGNOSIS — R609 Edema, unspecified: Secondary | ICD-10-CM

## 2018-05-11 DIAGNOSIS — L03116 Cellulitis of left lower limb: Secondary | ICD-10-CM | POA: Diagnosis not present

## 2018-05-11 MED ORDER — CEPHALEXIN 500 MG PO CAPS: 500.0000 mg | ORAL_CAPSULE | Freq: Four times a day (QID) | ORAL | 0 refills | Status: DC

## 2018-05-11 NOTE — Progress Notes (Signed)
Virtual Visit via telephone Note  I connected with Kent Bryant on 05/11/18 at 1015 by telephone and verified that I am speaking with the correct person using two identifiers. Kent Bryant is currently located at home and sister Kent Bryant are currently with her during visit. The provider, Fransisca Kaufmann , MD is located in their office at time of visit.  Call ended at 1025  I discussed the limitations, risks, security and privacy concerns of performing an evaluation and management service by telephone and the availability of in person appointments. I also discussed with the patient that there may be a patient responsible charge related to this service. The patient expressed understanding and agreed to proceed.   History and Present Illness: They said he is have drainage and leaking more out of his right leg and is having redness and warmth. His cardiology did have his diuretic increased to 80 bid and his weight and fluid has been going down. His right leg is warm and swelling and warm.  He has been having drainage out of both but increasing on the right leg.  They have been using wraps and gauze to help absorb it but it does not seem to be improving and has been going over the past few days that is been causing this problem for him.  No diagnosis found.  Outpatient Encounter Medications as of 05/11/2018  Medication Sig  . atorvastatin (LIPITOR) 40 MG tablet Take 1 tablet (40 mg total) by mouth daily at 6 PM.  . cholecalciferol (VITAMIN D) 1000 units tablet Take 2,000 Units by mouth daily.  . ferrous sulfate 325 (65 FE) MG tablet Take 1 tablet (325 mg total) by mouth 2 (two) times daily with a meal.  . folic acid (FOLVITE) 1 MG tablet Take 1 tablet (1 mg total) by mouth daily at 2 PM.  . furosemide (LASIX) 80 MG tablet Take 1 tablet (80 mg total) by mouth 2 (two) times daily.  Marland Kitchen gabapentin (NEURONTIN) 300 MG capsule Take 2 capsules (600 mg total) by mouth 3 (three) times daily.  Marland Kitchen KLOR-CON  M20 20 MEQ tablet TAKE 4 TABLETS BY MOUTH 2 TIMES DAILY (Patient taking differently: 2 tablets BID)  . lactulose (CHRONULAC) 10 GM/15ML solution Take 15 mLs (10 g total) by mouth 3 (three) times daily.  . sertraline (ZOLOFT) 50 MG tablet Take 1 tablet (50 mg total) by mouth daily.  Marland Kitchen spironolactone (ALDACTONE) 25 MG tablet Take 1 tablet (25 mg total) by mouth 2 (two) times daily.   No facility-administered encounter medications on file as of 05/11/2018.     Review of Systems  Constitutional: Negative for chills and fever.  Eyes: Negative for visual disturbance.  Respiratory: Negative for shortness of breath and wheezing.   Cardiovascular: Positive for leg swelling. Negative for chest pain.  Musculoskeletal: Negative for back pain and gait problem.  Skin: Positive for color change. Negative for rash.  All other systems reviewed and are negative.   Observations/Objective: Sister was speaking for the patient on the phone.  Assessment and Plan: Problem List Items Addressed This Visit      Other   Peripheral edema - Primary    Other Visit Diagnoses    Cellulitis of left leg       Relevant Medications   cephALEXin (KEFLEX) 500 MG capsule       Follow Up Instructions: Recommended close follow-up and if it is not improved within 3 or 4 days he may need either home health care  or [place] to put an Haematologist on him and start following it weekly.  Start with the Keflex and keep wrapping it with the wraps that they are using currently.    I discussed the assessment and treatment plan with the patient. The patient was provided an opportunity to ask questions and all were answered. The patient agreed with the plan and demonstrated an understanding of the instructions.   The patient was advised to call back or seek an in-person evaluation if the symptoms worsen or if the condition fails to improve as anticipated.  The above assessment and management plan was discussed with the patient. The  patient verbalized understanding of and has agreed to the management plan. Patient is aware to call the clinic if symptoms persist or worsen. Patient is aware when to return to the clinic for a follow-up visit. Patient educated on when it is appropriate to go to the emergency department.    I provided 10 minutes of non-face-to-face time during this encounter.    Worthy Rancher, MD

## 2018-05-15 ENCOUNTER — Telehealth: Payer: Self-pay | Admitting: Cardiology

## 2018-05-15 NOTE — Telephone Encounter (Signed)
Please give pt's sister Nicholes Rough a call concerning pt's BP and weight.   [phone]

## 2018-05-20 ENCOUNTER — Other Ambulatory Visit: Payer: Self-pay

## 2018-05-21 ENCOUNTER — Encounter: Payer: Self-pay | Admitting: Family Medicine

## 2018-05-21 ENCOUNTER — Other Ambulatory Visit: Payer: Self-pay

## 2018-05-21 ENCOUNTER — Ambulatory Visit (INDEPENDENT_AMBULATORY_CARE_PROVIDER_SITE_OTHER): Payer: 59 | Admitting: Family Medicine

## 2018-05-21 VITALS — BP 101/63 | HR 74 | Temp 97.5°F | Ht 71.0 in | Wt 225.8 lb

## 2018-05-21 DIAGNOSIS — K7460 Unspecified cirrhosis of liver: Secondary | ICD-10-CM | POA: Diagnosis not present

## 2018-05-21 DIAGNOSIS — L03115 Cellulitis of right lower limb: Secondary | ICD-10-CM

## 2018-05-21 DIAGNOSIS — E785 Hyperlipidemia, unspecified: Secondary | ICD-10-CM | POA: Diagnosis not present

## 2018-05-21 DIAGNOSIS — R609 Edema, unspecified: Secondary | ICD-10-CM

## 2018-05-21 DIAGNOSIS — F411 Generalized anxiety disorder: Secondary | ICD-10-CM

## 2018-05-21 MED ORDER — SULFAMETHOXAZOLE-TRIMETHOPRIM 800-160 MG PO TABS: 1.0000 | ORAL_TABLET | Freq: Two times a day (BID) | ORAL | 0 refills | Status: DC

## 2018-05-21 NOTE — Progress Notes (Signed)
BP 101/63   Pulse 74   Temp (!) 97.5 F (36.4 C) (Oral)   Ht _0  (1.803 m)   Wt 225 lb 12.8 oz (102.4 kg)   BMI 31.49 kg/m    Subjective:   Patient ID: Kent Bryant, male    DOB: 01-03-1951, 68 y.o.   MRN: [id number]  HPI: Kent Bryant is a 68 y.o. male presenting on 05/21/2018 for Edema (Patient was seen 5/4 and states it is no better) and Hyperlipidemia (3 month follow up)   HPI Patient is coming in for recheck of peripheral edema.  He was seen on 05/11/2018 for this and feels like it is no better and feels like he still got infection especially on the right lower leg.  He was given an antibiotic at that time of the Keflex and has taken that and is been trying to keep his feet elevated but feels like he is still weeping and around the toes he feels like it is more red and more of the almost purulent discharge.  Patient does have cirrhosis and that is where a lot of his fluid overload comes from.  Patient is coming in today for anxiety recheck as well.  He is currently taking the Zoloft and feels like his anxiety has been doing well and mostly controlled on this.  He denies any suicidal ideations or thoughts of hurting himself.  Even with all the coronavirus stuff he still feels like he is doing well with anxiety.  Hyperlipidemia Patient is coming in for recheck of his hyperlipidemia. The patient is currently taking Lipitor. They deny any issues with myalgias or history of liver damage from it. They deny any focal numbness or weakness or chest pain.    Relevant past medical, surgical, family and social history reviewed and updated as indicated. Interim medical history since our last visit reviewed. Allergies and medications reviewed and updated.  Review of Systems  Constitutional: Negative for chills and fever.  Respiratory: Negative for shortness of breath and wheezing.   Cardiovascular: Positive for leg swelling (3+ pitting edema with weeping from loose skin areas on both lower legs  and an area of erythema concentrated over the dorsum of the foot at the MTP joints). Negative for chest pain.  Musculoskeletal: Negative for back pain and gait problem.  Skin: Positive for color change. Negative for rash.  Psychiatric/Behavioral: Negative for decreased concentration, dysphoric mood, self-injury, sleep disturbance and suicidal ideas. The patient is not nervous/anxious.   All other systems reviewed and are negative.   Per HPI unless specifically indicated above   Allergies as of 05/21/2018   No Known Allergies     Medication List       Accurate as of May 21, 2018  2:37 PM. If you have any questions, ask your nurse or doctor.        STOP taking these medications   cephALEXin 500 MG capsule Commonly known as:  KEFLEX Stopped by:  Fransisca Kaufmann Dettinger, MD     TAKE these medications   atorvastatin 40 MG tablet Commonly known as:  LIPITOR Take 1 tablet (40 mg total) by mouth daily at 6 PM.   cholecalciferol 1000 units tablet Commonly known as:  VITAMIN D Take 2,000 Units by mouth daily.   ferrous sulfate 325 (65 FE) MG tablet Take 1 tablet (325 mg total) by mouth 2 (two) times daily with a meal.   folic acid 1 MG tablet Commonly known as:  FOLVITE Take 1 tablet (1  mg total) by mouth daily at 2 PM.   furosemide 80 MG tablet Commonly known as:  LASIX Take 1 tablet (80 mg total) by mouth 2 (two) times daily.   gabapentin 300 MG capsule Commonly known as:  NEURONTIN Take 2 capsules (600 mg total) by mouth 3 (three) times daily.   Klor-Con M20 20 MEQ tablet Generic drug:  potassium chloride SA TAKE 4 TABLETS BY MOUTH 2 TIMES DAILY What changed:  See the new instructions.   lactulose 10 GM/15ML solution Commonly known as:  CHRONULAC Take 15 mLs (10 g total) by mouth 3 (three) times daily.   sertraline 50 MG tablet Commonly known as:  ZOLOFT Take 1 tablet (50 mg total) by mouth daily.   spironolactone 25 MG tablet Commonly known as:  ALDACTONE Take 1  tablet (25 mg total) by mouth 2 (two) times daily.   sulfamethoxazole-trimethoprim 800-160 MG tablet Commonly known as:  BACTRIM DS Take 1 tablet by mouth 2 (two) times daily. Started by:  Fransisca Kaufmann Dettinger, MD        Objective:   BP 101/63   Pulse 74   Temp (!) 97.5 F (36.4 C) (Oral)   Ht _0  (1.803 m)   Wt 225 lb 12.8 oz (102.4 kg)   BMI 31.49 kg/m   Wt Readings from Last 3 Encounters:  05/21/18 225 lb 12.8 oz (102.4 kg)  05/05/18 221 lb (100.2 kg)  02/20/18 200 lb 9.6 oz (91 kg)    Physical Exam Vitals signs and nursing note reviewed.  Constitutional:      General: He is not in acute distress.    Appearance: He is well-developed. He is not diaphoretic.  Neck:     Thyroid: No thyromegaly.  Musculoskeletal:        General: Swelling (3+ pitting edema with weeping in bilateral lower extremities) present.  Skin:    General: Skin is warm and dry.     Findings: Erythema (Erythema and open areas over the dorsal part of his right foot at the MTP joint) present. No rash.  Neurological:     Mental Status: He is alert and oriented to person, place, and time.     Coordination: Coordination normal.  Psychiatric:        Behavior: Behavior normal.     Nurse to place Unna boots bilaterally, return in 5 to 6 days for replacement, leave on until then  Assessment & Plan:   Problem List Items Addressed This Visit      Digestive   Cirrhosis of liver without ascites ([place])   Relevant Orders   CBC with Differential/Platelet (Completed)   CMP14+EGFR (Completed)     Other   GAD (generalized anxiety disorder)   Hyperlipidemia with target LDL less than 130   Peripheral edema    Other Visit Diagnoses    Cellulitis of right lower extremity    -  Primary   Relevant Medications   sulfamethoxazole-trimethoprim (BACTRIM DS) 800-160 MG tablet      Concerning visual of right lower extremity especially around the toes where it looks like more purulent discharge rather than the  clear drainage that he gets from other places.  The right lower extremities more swollen than the left like it always is. Follow up plan: Return in about 6 days (around 05/27/2018), or if symptoms worsen or fail to improve, for Cellulitis recheck and Unna boot.  Counseling provided for all of the vaccine components Orders Placed This Encounter  Procedures  . CBC  with Differential/Platelet  . Potwin Dettinger, MD [facility] 05/21/2018, 2:37 PM

## 2018-05-22 LAB — CBC WITH DIFFERENTIAL/PLATELET
Basophils Absolute: 0.1 10*3/uL (ref 0.0–0.2)
Basos: 1 %
EOS (ABSOLUTE): 0.1 10*3/uL (ref 0.0–0.4)
Eos: 1 %
Hematocrit: 37.6 % (ref 37.5–51.0)
Hemoglobin: 13.2 g/dL (ref 13.0–17.7)
Immature Grans (Abs): 0.1 10*3/uL (ref 0.0–0.1)
Immature Granulocytes: 1 %
Lymphocytes Absolute: 1.1 10*3/uL (ref 0.7–3.1)
Lymphs: 10 %
MCH: 31.1 pg (ref 26.6–33.0)
MCHC: 35.1 g/dL (ref 31.5–35.7)
MCV: 89 fL (ref 79–97)
Monocytes Absolute: 1.2 10*3/uL — ABNORMAL HIGH (ref 0.1–0.9)
Monocytes: 11 %
Neutrophils Absolute: 8.4 10*3/uL — ABNORMAL HIGH (ref 1.4–7.0)
Neutrophils: 76 %
Platelets: 173 10*3/uL (ref 150–450)
RBC: 4.24 x10E6/uL (ref 4.14–5.80)
RDW: 15.2 % (ref 11.6–15.4)
WBC: 11 10*3/uL — ABNORMAL HIGH (ref 3.4–10.8)

## 2018-05-22 LAB — CMP14+EGFR
ALT: 21 IU/L (ref 0–44)
AST: 38 IU/L (ref 0–40)
Albumin/Globulin Ratio: 0.8 — ABNORMAL LOW (ref 1.2–2.2)
Albumin: 3.1 g/dL — ABNORMAL LOW (ref 3.8–4.8)
Alkaline Phosphatase: 163 IU/L — ABNORMAL HIGH (ref 39–117)
BUN/Creatinine Ratio: 12 (ref 10–24)
BUN: 13 mg/dL (ref 8–27)
Bilirubin Total: 2.5 mg/dL — ABNORMAL HIGH (ref 0.0–1.2)
CO2: 35 mmol/L — ABNORMAL HIGH (ref 20–29)
Calcium: 8.5 mg/dL — ABNORMAL LOW (ref 8.6–10.2)
Chloride: 85 mmol/L — ABNORMAL LOW (ref 96–106)
Creatinine, Ser: 1.06 mg/dL (ref 0.76–1.27)
GFR calc Af Amer: 84 mL/min/{1.73_m2} (ref >59)
GFR calc non Af Amer: 73 mL/min/{1.73_m2} (ref >59)
Globulin, Total: 3.7 g/dL (ref 1.5–4.5)
Glucose: 87 mg/dL (ref 65–99)
Potassium: 3.5 mmol/L (ref 3.5–5.2)
Sodium: 141 mmol/L (ref 134–144)
Total Protein: 6.8 g/dL (ref 6.0–8.5)

## 2018-05-26 ENCOUNTER — Other Ambulatory Visit: Payer: Self-pay | Admitting: Family Medicine

## 2018-05-26 ENCOUNTER — Telehealth: Payer: 59 | Admitting: Cardiology

## 2018-05-26 DIAGNOSIS — G609 Hereditary and idiopathic neuropathy, unspecified: Secondary | ICD-10-CM

## 2018-05-26 NOTE — Telephone Encounter (Signed)
Message was never routed to anyone - called pt for telehealth appt and sister didn't know anything about it, was trying to report BP/HR /weight from last week- says she took them daily from last Sunday through Friday - will forward to provider  217lbs BP 106/58 HR 75  217lbs BP 117/64 HR 70 217lbs BP 107/67 HR 80  214lbs BP 107/68 HR 80  218lbs BP 11764 HR 70  214lbs BP 114/63 HR 69

## 2018-05-26 NOTE — Progress Notes (Unsigned)
{Choose 1 Note Type (Telehealth Visit or Telephone Visit):[id number]}   Date:  05/26/2018   ID:  Kent Bryant, DOB 10-23-1950, MRN [id number]  {Patient Location:[id number]::"Home"} {Provider Location:[phone]::"Home"}  PCP:  Dettinger, Fransisca Kaufmann, MD  Cardiologist:  Carlyle Dolly, MD *** Electrophysiologist:  None   Evaluation Performed:  {Choose Visit Type:[id number]::"Follow-Up Visit"}  Chief Complaint:  ***  History of Present Illness:    Kent Bryant is a 68 y.o. male with ***   seen today for follow up of the following medical problems.   1.Constrictive pericarditis - patient previously followed at [facility] - we had seen him briefly during admission 07/2017 at [facility] presenting with anasarca ans signs of liver congestion - echo findings were concerning for constrictive pericarditis. Previous imaging at [facility] 06/2017 was reviewed and showed evidence of pericardial calcification. He was transferred to [facility] that admission since that is where he followed - from notes turned down by CT surgery for consideration for possible pericardectomy at the time due to comorbidities.  -weights 270 during 07/2017 admission,down to 190 lbs over last few months - home weights 190-192, no LE edema. Stable breathing - compliant with diuretics.    - he cancelled his appointment with CHF clinic, has not been willing to reschedule - no recent SOB or DOE. Sedentary lifestyle.  - some recent leg swelling. Wraps legs with some improvement. Some abdominal distension - taking lasix 60mg  bid.  02/2018 pcp weight 200 lbs   - last visit we increased his lasix to 80mg  bid - slow downtrend in weight, to 214 lbs most recently. Hearts rates 70s, bp's 110s/60s 05/21/18: Cr 1.06 BUN 13    2. Portal hypertension - followed by GI. 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 Chest wall  hematoma - 06/2017 CT at [facility] showed larger left posterolateral chest wall hematoma.    5. Lymphadenopathy - 06/2017 CTA chest showed numerous axillary, mediastinal, abomdinal, and pelvic lymph nodes - appears had lymph node biospy done at [facility]   The patient {does/does not:200015} have symptoms concerning for COVID-19 infection (fever, chills, cough, or new shortness of breath).    Past Medical History:  Diagnosis Date  . Abnormal CXR (chest x-ray)   . Agoraphobia   . Anxiety   . Bipolar disorder ([place])   . Chronic low back pain   . Coagulopathy ([place])   . COPD (chronic obstructive pulmonary disease) ([place])   . Depression   . Depression   . Hemophilia ([place])   . Insomnia   . Osteopenia   . Peripheral edema   . Peripheral neuropathy   . Peripheral neuropathy   . Vitamin D deficiency    Past Surgical History:  Procedure Laterality Date  . CATARACT EXTRACTION  05/29/2017  . CATARACT EXTRACTION W/PHACO Left 06/13/2017   Procedure: CATARACT EXTRACTION PHACO AND INTRAOCULAR LENS PLACEMENT LEFT EYE;  Surgeon: Baruch Goldmann, MD;  Location: [facility];  Service: Ophthalmology;  Laterality: Left;  CDE: 23.32     No outpatient medications have been marked as taking for the 05/26/18 encounter (Appointment) with Arnoldo Lenis, MD.     Allergies:   Patient has no known allergies.   Social History   Tobacco Use  . Smoking status: Former Smoker    Packs/day: 0.50    Years: 15.00    Pack years: 7.50    Types: Cigarettes    Last attempt to quit:  07/07/2017    Years since quitting: 0.8  . Smokeless tobacco: Never Used  Substance Use Topics  . Alcohol use: No  . Drug use: No     Family Hx: The patient's family history includes Stroke in his mother. There is no history of Colon cancer, Esophageal cancer, Liver disease, Pancreatic cancer, or Stomach cancer.  ROS:   Please see the history of present illness.    *** All other systems reviewed and are  negative.   Prior CV studies:   The following studies were reviewed today:  07/2017 echo Study Conclusions  - Left ventricle: The cavity size was normal. Wall thickness was normal. Systolic function was normal. The estimated ejection fraction was in the range of 55% to 60%. Mixed diastolic findings. High E/A ratio with borderline low deceleration time, blunting of pulmonary venous flow, normal left atrium, normal tissue velocities. There is a septal bounce. - Aortic valve: Valve area (VTI): 1.88 cm^2. Valve area (Vmax): 1.86 cm^2. Valve area (Vmean): 1.77 cm^2. - Mitral valve: There was mild regurgitation. - Pulmonary veins: There is systolic blunting of pulmonary vein flow consistent with elevated LA pressure. - Right ventricle: There is intermittent flattening of the ventricular septum that appears to vary with the respriatory cycle. - There are some signs concerning for possible constrictive pericarditis. Will order repeat limited study to evaluate respiratory variation of inflow as well as repeat mitral inflow, tissue velocities, obtain hepatic flow Dopplers.  10/2017 CT A/P IMPRESSION: 1. Although there is only subtle lobularity of the hepatic margin, there are considerable findings of portal venous hypertension including uphill paraesophageal varices, recanalization of the umbilical vein, and splenorenal shunting. These factors raise suspicion for underlying cirrhotic liver disease. 2. Contracted gallbladder with small gallstones. 3. Third spacing of fluid with mesenteric and subcutaneous edema, upper abdominal ascites, and right greater than left pleural effusions. 4. Findings of prominent prior calcific pericarditis, with dense sheets of calcification along the pericardial margins. These outline several collections of complex fluid including a 97 cubic cm anterior inferior pericardial fluid collection. 5. Pelvic adenopathy in addition to  mildly enlarged retroperitoneal adenopathy. While some of this could well be reactive adenopathy from cirrhosis, the pelvic adenopathy in particular is nonspecific and entity such as lymphoma are not readily excluded based on imaging. 6. Impingement at L4-5 and N8-G9. 7. Umbilical hernia containing adipose tissue and vessels.   [facility] studies 07/2017 echo SUMMARY The left ventricular size is normal. There is normal left ventricular wall thickness.  Left ventricular systolic function is normal. LV ejection fraction = 65-70%. No segmental wall motion abnormalities seen in the left ventricle. Septal bounce is noted.  Left ventricular filling pattern is indeterminate. The right ventricular cavity is small on RV focused A4c views due to  extrinsic compression from a bright echodensity better visualized by prior CT  chests. The right ventricular systolic function is normal. The left atrium is mildly dilated.  Right atrial size is normal with no evidence of collapse on diastole. There is mild aortic and mitral regurgitation. The IVC is mildly dilated with an abnormal collapsibility index, this  suggestive of increased right atrial pressure. There is a ascites present. Prior study, dated 06/26/17, is a limited study, however no significant change  is noted.   06/2017 CT PET 1.No hypermetabolic adenopathy to suggest aggressive lymphoma. PET/CT is limited in evaluation for indolent lymphoma. 2.Decreased size of the hematoma along the left chest wall.Mildly increased uptake within the right semitendinosus musculature and left lower  quadrant abdominal wall, favored to relate to additional sites of contusion/hematoma.  06/2017 CT chest CONCLUSION:  Moderate bilateral pleural effusions have increased slightly.  Slight interval worsening of atelectasis associated with pleural effusions. The left lower lobe and lingula are densely opacified. Pneumonia or aspiration could be  superimposed on atelectasis.  Unchanged extensive coarse pericardial calcification, likely related to remote hematoma. This places the patient at risk for constrictive physiology. There is a suggestion of distention of the inferior vena cava.  Patulous esophagus with fluid level in the mid thoracic esophagus, placing the patient risk for aspiration.  Interval development of small volume ascites.  Body wall edema has improved slightly. Left chest wall hematoma has improved slightly.  Suspected development of bilateral hemarthrosis in the partially visualized glenohumeral joints   06/2017 US guided lymph node biopsy  1.Technically successful U/S guided fine-needle aspiration of left inguinal lymph node. 2.No immediate complications. 3.Samples were sent to Pathology.   06/2017 CTA chest A/P 1. Large left posterolateral chest wall hematoma. Limited evaluation for active extravasation due to  incorrect bolus timing. If there are clinical findings of active extravasation, a follow-up chest CTA could be performed. 2. Numerous axillary, mediastinal, abdominal and pelvic lymph nodes. Bilateral inguinal lymph nodes and right pelvic lymph nodes are enlarged as described above. The differential includes lymphoma or infectious/inflammatory etiologies. Given the distribution, metastatic disease is felt to be less likely. 3. Extensive pericardial calcification which is associated with constrictive pericarditis. Echocardiogram may be helpful for further evaluation. 4. Small bilateral pleural effusions with bibasilar atelectasis.  Labs/Other Tests and Data Reviewed:    EKG:  {EKG/Telemetry Strips [phone]}  Recent Labs: 07/22/2017: B Natriuretic Peptide 326.0 09/19/2017: Pro B Natriuretic peptide (BNP) 312.0; TSH 2.20 12/16/2017: Magnesium 1.9 05/21/2018: ALT 21; BUN 13; Creatinine, Ser 1.06; Hemoglobin 13.2; Platelets 173; Potassium 3.5; Sodium 141   Recent Lipid Panel Lab Results   Component Value Date/Time   CHOL 104 01/21/2018 04:32 PM   TRIG 47 01/21/2018 04:32 PM   TRIG 131 06/08/2014 10:24 AM   HDL 38 (L) 01/21/2018 04:32 PM   HDL 27 (L) 06/08/2014 10:24 AM   CHOLHDL 2.7 01/21/2018 04:32 PM   LDLCALC 57 01/21/2018 04:32 PM   LDLCALC 90 10/21/2013 03:54 PM    Wt Readings from Last 3 Encounters:  05/21/18 225 lb 12.8 oz (102.4 kg)  05/05/18 221 lb (100.2 kg)  02/20/18 200 lb 9.6 oz (91 kg)     Objective:    Vital Signs:  There were no vitals taken for this visit.   {[facility] Virtual Exam (Optional):[phone]::"VITAL SIGNS:  reviewed"}  ASSESSMENT & PLAN:     1. Constrictive pericarditis - clinical data supports constrictive pericarditis including prior echo fidings, CT findings - he is down 80 lbs since I had seen him in the hospital in 07/2017, volume status dramatically improved - echo personally reviewed and despite report continues to support constrictive pericarditis. Did not show clear pericardial effusion as suggested by his recent CT A/P. He reports severe claustrophobia and not willing for cardiac MRI even with sedation - previously he was turned down by CT surgery at [facility] for pericardectomy consideration. He is down 80 lbs since then and fluid status much improved, may warrant reconsideration  - he is reluctant to see CHF clinic or consider surgical evaluation - increase lasix to 80mg  bid, update [place] with his weights in 1 week.   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 Lymph node biopsy 06/2017 was nondiagnostic. From heme/onc notes indolent lymphoma cannot be entirely ruled out. Has been clinically stable without recent bleeding.  - during admission at [facility] he received high dose  dexamethasone and 1 dose of rituximab.   3. Chest wall hematoma - admitted to [facility] 06/2017 after a fall at home. Admitted with Hgb 6.9, abnormal PTT. Found toh ave a large extrathoracic hemoatoma long the left posterior chest and abdominal wall. 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 Plans were for cardiac MRI but patient was too dyspneic to lay down for procedure.    COVID-19 Education: The signs and symptoms of COVID-19 were discussed with the patient and how to seek care for testing (follow up with PCP or arrange E-visit).  ***The importance of social distancing was discussed today.  Time:   Today, I have spent *** minutes with the patient with telehealth technology discussing the above problems.     Medication Adjustments/Labs and Tests Ordered: Current medicines are reviewed at length with the patient today.  Concerns regarding medicines are outlined above.   Tests Ordered: No orders of the defined types were placed in this encounter.   Medication Changes: No orders of the defined types were placed in this encounter.   Disposition:  Follow up {follow up:15908}  Merrily Pew, MD  05/26/2018 2:40 PM    [facility]

## 2018-05-27 ENCOUNTER — Other Ambulatory Visit: Payer: Self-pay | Admitting: Family Medicine

## 2018-05-27 ENCOUNTER — Other Ambulatory Visit: Payer: Self-pay

## 2018-05-27 DIAGNOSIS — F325 Major depressive disorder, single episode, in full remission: Secondary | ICD-10-CM

## 2018-05-27 DIAGNOSIS — F5104 Psychophysiologic insomnia: Secondary | ICD-10-CM

## 2018-05-28 ENCOUNTER — Encounter: Payer: Self-pay | Admitting: Cardiology

## 2018-05-28 ENCOUNTER — Telehealth: Payer: Self-pay | Admitting: Family Medicine

## 2018-05-28 ENCOUNTER — Encounter: Payer: 59 | Admitting: Family Medicine

## 2018-05-28 ENCOUNTER — Ambulatory Visit (INDEPENDENT_AMBULATORY_CARE_PROVIDER_SITE_OTHER): Payer: 59 | Admitting: Family Medicine

## 2018-05-28 ENCOUNTER — Telehealth (INDEPENDENT_AMBULATORY_CARE_PROVIDER_SITE_OTHER): Payer: 59 | Admitting: Cardiology

## 2018-05-28 ENCOUNTER — Encounter: Payer: Self-pay | Admitting: Family Medicine

## 2018-05-28 VITALS — HR 75 | Ht 71.0 in | Wt 221.0 lb

## 2018-05-28 VITALS — BP 109/58 | HR 75 | Temp 97.2°F | Ht 71.0 in | Wt 224.4 lb

## 2018-05-28 DIAGNOSIS — L03115 Cellulitis of right lower limb: Secondary | ICD-10-CM

## 2018-05-28 DIAGNOSIS — R609 Edema, unspecified: Secondary | ICD-10-CM | POA: Diagnosis not present

## 2018-05-28 DIAGNOSIS — I311 Chronic constrictive pericarditis: Secondary | ICD-10-CM

## 2018-05-28 NOTE — Telephone Encounter (Signed)
I do not see on patients medication list 

## 2018-05-28 NOTE — Progress Notes (Signed)
BP (!) 109/58   Pulse 75   Temp (!) 97.2 F (36.2 C) (Oral)   Ht 5\' 11"  (1.803 m)   Wt 224 lb 6.4 oz (101.8 kg)   BMI 31.30 kg/m    Subjective:   Patient ID: Kent Bryant, male    DOB: Jul 19, 1950, 68 y.o.   MRN: [id number]  HPI: Kent Bryant is a 68 y.o. male presenting on 05/28/2018 for Cellulitis (bilateral lower legs- 1 week re check)   HPI Bilateral lower extremity edema and erythema Patient is coming in today for bilateral lower extremity recheck and right lower extremity cellulitis around his foot recheck.  He has been using the in the boot for 6 days now and is coming in for this recheck.  He has been on the antibiotic as well.  His sister who is here with him today says that she feels like the redness looks better than previous, it does slightly look better.  The purulent discharge is also slightly looking better.  Patient denies any fevers or chills.  He had elevated liver function is trying to get back into his liver doctor and has an appointment.  Relevant past medical, surgical, family and social history reviewed and updated as indicated. Interim medical history since our last visit reviewed. Allergies and medications reviewed and updated.  Review of Systems  Constitutional: Negative for chills and fever.  Respiratory: Negative for shortness of breath and wheezing.   Cardiovascular: Negative for chest pain and leg swelling.  Musculoskeletal: Negative for back pain and gait problem.  Skin: Positive for color change, rash and wound.  All other systems reviewed and are negative.   Per HPI unless specifically indicated above      Objective:   BP (!) 109/58   Pulse 75   Temp (!) 97.2 F (36.2 C) (Oral)   Ht 5\' 11"  (1.803 m)   Wt 224 lb 6.4 oz (101.8 kg)   BMI 31.30 kg/m   Wt Readings from Last 3 Encounters:  05/28/18 224 lb 6.4 oz (101.8 kg)  05/21/18 225 lb 12.8 oz (102.4 kg)  05/05/18 221 lb (100.2 kg)    Physical Exam Vitals signs and nursing note  reviewed.  Constitutional:      General: He is not in acute distress.    Appearance: He is well-developed. He is not diaphoretic.  Neck:     Thyroid: No thyromegaly.  Musculoskeletal:        General: Swelling (Swelling has improved and his legs are about two thirds now what they were previously) present.  Skin:    General: Skin is warm and dry.     Findings: Erythema (Says a small amount of erythema around the top of his foot around the MTP joints of the second third and fourth digits.) and wound (Weeping and peeling legs of both lower extremities but much improved on the left lower extremity and still has some on the right lower extremity.) present. No rash.  Neurological:     Mental Status: He is alert and oriented to person, place, and time.     Coordination: Coordination normal.  Psychiatric:        Behavior: Behavior normal.      Assessment & Plan:   Problem List Items Addressed This Visit      Other   Peripheral edema - Primary    Other Visit Diagnoses    Cellulitis of right lower extremity          Nurse to place [facility]  boot again, looks improved slightly, continue antibiotic for now as well.  Patient had elevated bilirubin on last has appointment with gastroenterology on 16 July, call can get in sooner. Follow up plan: Return in about 6 days (around 06/03/2018), or if symptoms worsen or fail to improve, for Edema right lower extremity cellulitis recheck, [place] boot.  Counseling provided for all of the vaccine components No orders of the defined types were placed in this encounter.   Caryl Pina, MD [facility] 05/28/2018, 2:06 PM

## 2018-05-28 NOTE — Progress Notes (Signed)
Virtual Visit via Telephone Note   This visit type was conducted due to national recommendations for restrictions regarding the COVID-19 Pandemic (e.g. social distancing) in an effort to limit this patient's exposure and mitigate transmission in our community.  Due to his co-morbid illnesses, this patient is at least at moderate risk for complications without adequate follow up.  This format is felt to be most appropriate for this patient at this time.  The patient did not have access to video technology/had technical difficulties with video requiring transitioning to audio format only (telephone).  All issues noted in this document were discussed and addressed.  No physical exam could be performed with this format.  Please refer to the patient's chart for his  consent to telehealth for [facility].   Date:  05/28/2018   ID:  Kent Bryant, DOB 10/20/1950, MRN [id number]  Patient Location: Home Provider Location: Home  PCP:  Dettinger, Fransisca Kaufmann, MD  Cardiologist:  Carlyle Dolly, MD  Electrophysiologist:  None   Evaluation Performed:  Follow-Up Visit  Chief Complaint:  3 week follow up  History of Present Illness:    Kent Bryant is a 68 y.o. male with seen today for a focused visit today on history of constrictive pericardits and recent fluid retention   1.Constrictive pericarditis - patient previously followed at [facility] - we had seen him briefly during admission 07/2017 at [facility] presenting with anasarca ans signs of liver congestion - echo findings were concerning for constrictive pericarditis. Previous imaging at [facility] 06/2017 was reviewed and showed evidence of pericardial calcification. He was transferred to [facility] that admission since that is where he followed - from notes turned down by CT surgery for consideration for possible pericardectomy at the time due to comorbidities.  -weights 270 during 07/2017 admission,down to 190 lbs over last few months - home weights  190-192, no LE edema. Stable breathing - compliant with diuretics.    - he cancelled his appointment with CHF clinic, has not been willing to reschedule - no recent SOB or DOE. Sedentary lifestyle.  - some recent leg swelling. Wraps legs with some improvement. Some abdominal distension - taking lasix 60mg  bid.  02/2018 pcp weight 200 lbs      - last visit we increased his lasix to 80mg  bid - slow downtrend in weight, to 214 lbs most recently. Hearts rates 70s, bp's 110s/60s 05/21/18: Cr 1.06 BUN 13  -From pcp visit today weight 224 lbs. Though looks like historically weights run higher in clinic  - swelling has improved in legs.      The patient does not have symptoms concerning for COVID-19 infection (fever, chills, cough, or new shortness of breath).    Past Medical History:  Diagnosis Date  . Abnormal CXR (chest x-ray)   . Agoraphobia   . Anxiety   . Bipolar disorder ([place])   . Chronic low back pain   . Coagulopathy ([place])   . COPD (chronic obstructive pulmonary disease) ([place])   . Depression   . Depression   . 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 Insomnia   . Osteopenia   . Peripheral edema   . Peripheral neuropathy   . Peripheral neuropathy   . Vitamin D deficiency    Past Surgical History:  Procedure Laterality Date  . CATARACT EXTRACTION  05/29/2017  . CATARACT EXTRACTION W/PHACO Left 06/13/2017   Procedure: CATARACT EXTRACTION PHACO AND INTRAOCULAR LENS PLACEMENT LEFT EYE;  Surgeon: Baruch Goldmann, MD;  Location: [facility];  Service:  Ophthalmology;  Laterality: Left;  CDE: 23.32     No outpatient medications have been marked as taking for the 05/28/18 encounter (Appointment) with Arnoldo Lenis, MD.     Allergies:   Patient has no known allergies.   Social History   Tobacco Use  . Smoking status: Former Smoker    Packs/day: 0.50    Years: 15.00    Pack years: 7.50    Types: Cigarettes    Last attempt to quit: 07/07/2017    Years since quitting: 0.8  .  Smokeless tobacco: Never Used  Substance Use Topics  . Alcohol use: No  . Drug use: No     Family Hx: The patient's family history includes Stroke in his mother. There is no history of Colon cancer, Esophageal cancer, Liver disease, Pancreatic cancer, or Stomach cancer.  ROS:   Please see the history of present illness.    All other systems reviewed and are negative.   Prior CV studies:   The following studies were reviewed today:  07/2017 echo Study Conclusions  - Left ventricle: The cavity size was normal. Wall thickness was normal. Systolic function was normal. The estimated ejection fraction was in the range of 55% to 60%. Mixed diastolic findings. High E/A ratio with borderline low deceleration time, blunting of pulmonary venous flow, normal left atrium, normal tissue velocities. There is a septal bounce. - Aortic valve: Valve area (VTI): 1.88 cm^2. Valve area (Vmax): 1.86 cm^2. Valve area (Vmean): 1.77 cm^2. - Mitral valve: There was mild regurgitation. - Pulmonary veins: There is systolic blunting of pulmonary vein flow consistent with elevated LA pressure. - Right ventricle: There is intermittent flattening of the ventricular septum that appears to vary with the respriatory cycle. - There are some signs concerning for possible constrictive pericarditis. Will order repeat limited study to evaluate respiratory variation of inflow as well as repeat mitral inflow, tissue velocities, obtain hepatic flow Dopplers.  10/2017 CT A/P IMPRESSION: 1. Although there is only subtle lobularity of the hepatic margin, there are considerable findings of portal venous hypertension including uphill paraesophageal varices, recanalization of the umbilical vein, and splenorenal shunting. These factors raise suspicion for underlying cirrhotic liver disease. 2. Contracted gallbladder with small gallstones. 3. Third spacing of fluid with mesenteric and  subcutaneous edema, upper abdominal ascites, and right greater than left pleural effusions. 4. Findings of prominent prior calcific pericarditis, with dense sheets of calcification along the pericardial margins. These outline several collections of complex fluid including a 97 cubic cm anterior inferior pericardial fluid collection. 5. Pelvic adenopathy in addition to mildly enlarged retroperitoneal adenopathy. While some of this could well be reactive adenopathy from cirrhosis, the pelvic adenopathy in particular is nonspecific and entity such as lymphoma are not readily excluded based on imaging. 6. Impingement at L4-5 and W0-J8. 7. Umbilical hernia containing adipose tissue and vessels.   [place] studies 07/2017 echo SUMMARY The left ventricular size is normal. There is normal left ventricular wall thickness.  Left ventricular systolic function is normal. LV ejection fraction = 65-70%. No segmental wall motion abnormalities seen in the left ventricle. Septal bounce is noted.  Left ventricular filling pattern is indeterminate. The right ventricular cavity is small on RV focused A4c views due to  extrinsic compression from a bright echodensity better visualized by prior CT  chests. The right ventricular systolic function is normal. The left atrium is mildly dilated.  Right atrial size is normal with no evidence of collapse on diastole. There is mild  aortic and mitral regurgitation. The IVC is mildly dilated with an abnormal collapsibility index, this  suggestive of increased right atrial pressure. There is a ascites present. Prior study, dated 06/26/17, is a limited study, however no significant change  is noted.   06/2017 CT PET 1.No hypermetabolic adenopathy to suggest aggressive lymphoma. PET/CT is limited in evaluation for indolent lymphoma. 2.Decreased size of the hematoma along the left chest wall.Mildly increased uptake within the right semitendinosus  musculature and left lower quadrant abdominal wall, favored to relate to additional sites of contusion/hematoma.  06/2017 CT chest CONCLUSION:  Moderate bilateral pleural effusions have increased slightly.  Slight interval worsening of atelectasis associated with pleural effusions. The left lower lobe and lingula are densely opacified. Pneumonia or aspiration could be superimposed on atelectasis.  Unchanged extensive coarse pericardial calcification, likely related to remote hematoma. This places the patient at risk for constrictive physiology. There is a suggestion of distention of the inferior vena cava.  Patulous esophagus with fluid level in the mid thoracic esophagus, placing the patient risk for aspiration.  Interval development of small volume ascites.  Body wall edema has improved slightly. Left chest wall hematoma has improved slightly.  Suspected development of bilateral hemarthrosis in the partially visualized glenohumeral joints   06/2017 US guided lymph node biopsy  1.Technically successful U/S guided fine-needle aspiration of left inguinal lymph node. 2.No immediate complications. 3.Samples were sent to Pathology.   06/2017 CTA chest A/P 1. Large left posterolateral chest wall hematoma. Limited evaluation for active extravasation due to  incorrect bolus timing. If there are clinical findings of active extravasation, a follow-up chest CTA could be performed. 2. Numerous axillary, mediastinal, abdominal and pelvic lymph nodes. Bilateral inguinal lymph nodes and right pelvic lymph nodes are enlarged as described above. The differential includes lymphoma or infectious/inflammatory etiologies. Given the distribution, metastatic disease is felt to be less likely. 3. Extensive pericardial calcification which is associated with constrictive pericarditis. Echocardiogram may be helpful for further evaluation. 4. Small bilateral pleural effusions with bibasilar atelectasis.   Labs/Other Tests and Data Reviewed:    EKG:  No ECG reviewed.  Recent Labs: 07/22/2017: B Natriuretic Peptide 326.0 09/19/2017: Pro B Natriuretic peptide (BNP) 312.0; TSH 2.20 12/16/2017: Magnesium 1.9 05/21/2018: ALT 21; BUN 13; Creatinine, Ser 1.06; Hemoglobin 13.2; Platelets 173; Potassium 3.5; Sodium 141   Recent Lipid Panel Lab Results  Component Value Date/Time   CHOL 104 01/21/2018 04:32 PM   TRIG 47 01/21/2018 04:32 PM   TRIG 131 06/08/2014 10:24 AM   HDL 38 (L) 01/21/2018 04:32 PM   HDL 27 (L) 06/08/2014 10:24 AM   CHOLHDL 2.7 01/21/2018 04:32 PM   LDLCALC 57 01/21/2018 04:32 PM   LDLCALC 90 10/21/2013 03:54 PM    Wt Readings from Last 3 Encounters:  05/28/18 224 lb 6.4 oz (101.8 kg)  05/21/18 225 lb 12.8 oz (102.4 kg)  05/05/18 221 lb (100.2 kg)     Objective:    Vital Signs:   Today's Vitals   05/28/18 1547  Pulse: 75  Weight: 221 lb (100.2 kg)  Height: 5\' 11"  (1.803 m)   Body mass index is 30.82 kg/m.  Normal affect. Normal speech pattern and tone. Comfortable, no apparent distress. No audible signs of SOb or wheezing   ASSESSMENT & PLAN:    1. Constrictive pericarditis - clinical data supports constrictive pericarditis including prior echo fidings, CT findings - he is down 80 lbs since I had seen him in the hospital in 07/2017, volume status  dramatically improved - echo personally reviewed and despite report continues to support constrictive pericarditis. Did not show clear pericardial effusion as suggested by his recent CT A/P. He reports severe claustrophobia and not willing for cardiac MRI even with sedation - previously he was turned down by CT surgery at [facility] for pericardectomy consideration. He is down 80 lbs since then and fluid status much improved, may warrant reconsideration  - he is reluctant to see CHF clinic or consider surgical evaluation   - edema improving with diuretic increase but no significant sustained weight loss.  - we will  stop lasix, start torsemide 40mg  bid.    COVID-19 Education: The signs and symptoms of COVID-19 were discussed with the patient and how to seek care for testing (follow up with PCP or arrange E-visit).  The importance of social distancing was discussed today.  Time:   Today, I have spent 16 minutes with the patient with telehealth technology discussing the above problems.     Medication Adjustments/Labs and Tests Ordered: Current medicines are reviewed at length with the patient today.  Concerns regarding medicines are outlined above.   Tests Ordered: No orders of the defined types were placed in this encounter.   Medication Changes: No orders of the defined types were placed in this encounter.   Disposition:  Follow up 2 weeks   Signed, Carlyle Dolly, MD  05/28/2018 3:23 PM    [place]

## 2018-05-29 MED ORDER — TORSEMIDE 20 MG PO TABS: ORAL_TABLET | ORAL | 3 refills | Status: DC

## 2018-05-29 NOTE — Patient Instructions (Signed)
Your physician recommends that you schedule a follow-up appointment in: [place] physician has recommended you make the following change in your medication:   STOP LASIX   START TORSEMIDE 40 MG (2 TABLETS) TWICE DAILY   Thank you for choosing [facility]!!

## 2018-06-02 ENCOUNTER — Other Ambulatory Visit: Payer: Self-pay

## 2018-06-02 ENCOUNTER — Telehealth: Payer: Self-pay | Admitting: Family Medicine

## 2018-06-02 ENCOUNTER — Encounter: Payer: Self-pay | Admitting: Family Medicine

## 2018-06-02 ENCOUNTER — Ambulatory Visit (INDEPENDENT_AMBULATORY_CARE_PROVIDER_SITE_OTHER): Payer: 59 | Admitting: Family Medicine

## 2018-06-02 VITALS — BP 102/60 | HR 77 | Temp 98.7°F | Ht 71.0 in | Wt 214.0 lb

## 2018-06-02 DIAGNOSIS — E876 Hypokalemia: Secondary | ICD-10-CM | POA: Diagnosis not present

## 2018-06-02 DIAGNOSIS — L03115 Cellulitis of right lower limb: Secondary | ICD-10-CM

## 2018-06-02 DIAGNOSIS — L03116 Cellulitis of left lower limb: Secondary | ICD-10-CM | POA: Diagnosis not present

## 2018-06-02 MED ORDER — DOXYCYCLINE HYCLATE 100 MG PO TABS: 100.0000 mg | ORAL_TABLET | Freq: Two times a day (BID) | ORAL | 0 refills | Status: DC

## 2018-06-02 MED ORDER — MEDIHONEY WOUND/BURN DRESSING EX GEL: 1.0000 "application " | Freq: Every day | CUTANEOUS | 1 refills | Status: AC

## 2018-06-02 NOTE — Telephone Encounter (Signed)
Apt scheduled.  

## 2018-06-02 NOTE — Progress Notes (Signed)
BP 102/60   Pulse 77   Temp 98.7 F (37.1 C) (Oral)   Ht _0  (1.803 m)   Wt 214 lb (97.1 kg)   BMI 29.85 kg/m    Subjective:   Patient ID: Kent Bryant, male    DOB: [birth year], 68 y.o.   MRN: [id number]  HPI: Kent Bryant is a 68 y.o. male presenting on 06/02/2018 for Cellulitis (bilateral lower legs- 6 day re check )   HPI Bilateral lower extremity edema and weeping and cellulitis and infection Patient is coming in for recheck of bilateral edema and weeping and cellulitis infection that we have been treating over the past few weeks.  There is continued to be drainage and it continues to be tender and swollen although the Unna boots have been helping that is been wearing.  He is still taking the doxycycline and still having some weeping especially around the toes and cracking.  Relevant past medical, surgical, family and social history reviewed and updated as indicated. Interim medical history since our last visit reviewed. Allergies and medications reviewed and updated.  Review of Systems  Constitutional: Negative for chills and fever.  Eyes: Negative for visual disturbance.  Respiratory: Negative for shortness of breath and wheezing.   Cardiovascular: Positive for leg swelling. Negative for chest pain.  Musculoskeletal: Negative for back pain and gait problem.  Skin: Positive for color change and wound. Negative for rash.  All other systems reviewed and are negative.   Per HPI unless specifically indicated above   Allergies as of 06/02/2018   No Known Allergies     Medication List       Accurate as of Jun 02, 2018  3:03 PM. If you have any questions, ask your nurse or doctor.        atorvastatin 40 MG tablet Commonly known as:  LIPITOR Take 1 tablet (40 mg total) by mouth daily at 6 PM.   cholecalciferol 1000 units tablet Commonly known as:  VITAMIN D Take 2,000 Units by mouth daily.   doxycycline 100 MG tablet Commonly known as:  VIBRA-TABS Take 1 tablet  (100 mg total) by mouth 2 (two) times daily. 1 po bid Started by:  Fransisca Kaufmann , MD   ferrous sulfate 325 (65 FE) MG tablet Take 1 tablet (325 mg total) by mouth 2 (two) times daily with a meal.   folic acid 1 MG tablet Commonly known as:  FOLVITE Take 1 tablet (1 mg total) by mouth daily at 2 PM.   gabapentin 300 MG capsule Commonly known as:  NEURONTIN TAKE 2 CAPSULES (600 MG TOTAL) BY MOUTH 3 (THREE) TIMES DAILY.   Klor-Con M20 20 MEQ tablet Generic drug:  potassium chloride SA TAKE 4 TABLETS BY MOUTH 2 TIMES DAILY What changed:  See the new instructions.   lactulose 10 GM/15ML solution Commonly known as:  CHRONULAC Take 15 mLs (10 g total) by mouth 3 (three) times daily.   Medihoney Wound/Burn Dressing Gel Apply 1 application topically daily. Started by:  Worthy Rancher, MD   sertraline 50 MG tablet Commonly known as:  ZOLOFT Take 1 tablet (50 mg total) by mouth daily.   spironolactone 25 MG tablet Commonly known as:  ALDACTONE Take 1 tablet (25 mg total) by mouth 2 (two) times daily.   sulfamethoxazole-trimethoprim 800-160 MG tablet Commonly known as:  BACTRIM DS Take 1 tablet by mouth 2 (two) times daily.   torsemide 20 MG tablet Commonly known as:  DEMADEX TAKE 2 TABLETS  TWICE DAILY   traZODone 50 MG tablet Commonly known as:  DESYREL TAKE 1-2 TABLETS BY MOUTH AT BEDTIME AS NEEDED FOR SLEEP.        Objective:   BP 102/60   Pulse 77   Temp 98.7 F (37.1 C) (Oral)   Ht _0  (1.803 m)   Wt 214 lb (97.1 kg)   BMI 29.85 kg/m   Wt Readings from Last 3 Encounters:  06/02/18 214 lb (97.1 kg)  05/28/18 221 lb (100.2 kg)  05/28/18 224 lb 6.4 oz (101.8 kg)    Physical Exam Vitals signs and nursing note reviewed.  Constitutional:      General: He is not in acute distress.    Appearance: He is well-developed. He is not diaphoretic.  Eyes:     General: No scleral icterus.    Conjunctiva/sclera: Conjunctivae normal.  Neck:     Thyroid: No  thyromegaly.  Musculoskeletal: Normal range of motion.        General: Swelling (Patient still has at least 2+ bilateral extremity edema with swelling especially around the feet and cracking and some erythema around the toes and is still has weeping throughout his legs although much improved on the left lower extremity compared to the ) present.  Skin:    General: Skin is warm and dry.     Findings: No rash.  Neurological:     Mental Status: He is alert and oriented to person, place, and time.     Coordination: Coordination normal.  Psychiatric:        Behavior: Behavior normal.       Assessment & Plan:   Problem List Items Addressed This Visit      Other   Hypokalemia   Relevant Orders   BMP8+EGFR    Other Visit Diagnoses    Cellulitis of right lower extremity    -  Primary   Relevant Medications   Wound Dressings (MEDIHONEY WOUND/BURN DRESSING) GEL   doxycycline (VIBRA-TABS) 100 MG tablet   Cellulitis of left leg       Relevant Medications   Wound Dressings (MEDIHONEY WOUND/BURN DRESSING) GEL   doxycycline (VIBRA-TABS) 100 MG tablet      Nurse to place an Haematologist bilaterally, patient tolerated well, return in 6 days  We will try and get the medicated zinc Unna boots and continue antibiotic and will try and get meta honey and see if we can put that on before we do the wrap next time he will bring it with him. Follow up plan: Return in about 6 days (around 06/08/2018), or if symptoms worsen or fail to improve.  Counseling provided for all of the vaccine components Orders Placed This Encounter  Procedures  . BMP8+EGFR    Caryl Pina, MD [facility] 06/02/2018, 3:03 PM

## 2018-06-03 LAB — BMP8+EGFR
BUN/Creatinine Ratio: 9 — ABNORMAL LOW (ref 10–24)
BUN: 12 mg/dL (ref 8–27)
CO2: 26 mmol/L (ref 20–29)
Calcium: 8.6 mg/dL (ref 8.6–10.2)
Chloride: 93 mmol/L — ABNORMAL LOW (ref 96–106)
Creatinine, Ser: 1.29 mg/dL — ABNORMAL HIGH (ref 0.76–1.27)
GFR calc Af Amer: 66 mL/min/{1.73_m2} (ref >59)
GFR calc non Af Amer: 57 mL/min/{1.73_m2} — ABNORMAL LOW (ref >59)
Glucose: 89 mg/dL (ref 65–99)
Potassium: 4.2 mmol/L (ref 3.5–5.2)
Sodium: 135 mmol/L (ref 134–144)

## 2018-06-08 ENCOUNTER — Encounter: Payer: 59 | Admitting: Family Medicine

## 2018-06-10 ENCOUNTER — Ambulatory Visit (INDEPENDENT_AMBULATORY_CARE_PROVIDER_SITE_OTHER): Payer: 59 | Admitting: Family Medicine

## 2018-06-10 ENCOUNTER — Encounter: Payer: Self-pay | Admitting: Family Medicine

## 2018-06-10 ENCOUNTER — Other Ambulatory Visit: Payer: Self-pay

## 2018-06-10 ENCOUNTER — Ambulatory Visit: Payer: 59

## 2018-06-10 VITALS — BP 87/47 | HR 79 | Temp 97.7°F

## 2018-06-10 DIAGNOSIS — L03115 Cellulitis of right lower limb: Secondary | ICD-10-CM

## 2018-06-10 DIAGNOSIS — L03116 Cellulitis of left lower limb: Secondary | ICD-10-CM | POA: Diagnosis not present

## 2018-06-10 LAB — CBC WITH DIFFERENTIAL/PLATELET
Basophils Absolute: 0.1 10*3/uL (ref 0.0–0.2)
Basos: 1 %
EOS (ABSOLUTE): 0.1 10*3/uL (ref 0.0–0.4)
Eos: 1 %
Hematocrit: 36.6 % — ABNORMAL LOW (ref 37.5–51.0)
Hemoglobin: 12.4 g/dL — ABNORMAL LOW (ref 13.0–17.7)
Immature Grans (Abs): 0.1 10*3/uL (ref 0.0–0.1)
Immature Granulocytes: 1 %
Lymphocytes Absolute: 1 10*3/uL (ref 0.7–3.1)
Lymphs: 11 %
MCH: 30.8 pg (ref 26.6–33.0)
MCHC: 33.9 g/dL (ref 31.5–35.7)
MCV: 91 fL (ref 79–97)
Monocytes Absolute: 1.4 10*3/uL — ABNORMAL HIGH (ref 0.1–0.9)
Monocytes: 16 %
Neutrophils Absolute: 6.1 10*3/uL (ref 1.4–7.0)
Neutrophils: 70 %
Platelets: 146 10*3/uL — ABNORMAL LOW (ref 150–450)
RBC: 4.03 x10E6/uL — ABNORMAL LOW (ref 4.14–5.80)
RDW: 15.1 % (ref 11.6–15.4)
WBC: 8.8 10*3/uL (ref 3.4–10.8)

## 2018-06-10 LAB — CMP14+EGFR
ALT: 21 IU/L (ref 0–44)
AST: 41 IU/L — ABNORMAL HIGH (ref 0–40)
Albumin/Globulin Ratio: 0.8 — ABNORMAL LOW (ref 1.2–2.2)
Albumin: 2.8 g/dL — ABNORMAL LOW (ref 3.8–4.8)
Alkaline Phosphatase: 143 IU/L — ABNORMAL HIGH (ref 39–117)
BUN/Creatinine Ratio: 14 (ref 10–24)
BUN: 19 mg/dL (ref 8–27)
Bilirubin Total: 1.8 mg/dL — ABNORMAL HIGH (ref 0.0–1.2)
CO2: 30 mmol/L — ABNORMAL HIGH (ref 20–29)
Calcium: 8.5 mg/dL — ABNORMAL LOW (ref 8.6–10.2)
Chloride: 89 mmol/L — ABNORMAL LOW (ref 96–106)
Creatinine, Ser: 1.33 mg/dL — ABNORMAL HIGH (ref 0.76–1.27)
GFR calc Af Amer: 64 mL/min/{1.73_m2} (ref >59)
GFR calc non Af Amer: 55 mL/min/{1.73_m2} — ABNORMAL LOW (ref >59)
Globulin, Total: 3.6 g/dL (ref 1.5–4.5)
Glucose: 98 mg/dL (ref 65–99)
Potassium: 4.1 mmol/L (ref 3.5–5.2)
Sodium: 131 mmol/L — ABNORMAL LOW (ref 134–144)
Total Protein: 6.4 g/dL (ref 6.0–8.5)

## 2018-06-10 MED ORDER — DOXYCYCLINE HYCLATE 100 MG PO TABS: 100.0000 mg | ORAL_TABLET | Freq: Two times a day (BID) | ORAL | 0 refills | Status: DC

## 2018-06-10 NOTE — Progress Notes (Signed)
BP (!) 87/47   Pulse 79   Temp 97.7 F (36.5 C) (Oral)    Subjective:   Patient ID: Kent Bryant, male    DOB: [birth year], 68 y.o.   MRN: [id number]  HPI: Kent Bryant is a 68 y.o. male presenting on 06/10/2018 for Cellulitis (1 week follow up)   HPI Leg is still more painful warm or swollen, his blood pressure is down today just slightly as well, the concern is that he is getting more more the infection or inflammation.  He has been doing the [employer] with calamine, will do an Haematologist with zinc because we have those now.  Patient denies any fevers or chills or nausea or vomiting.  He is eating and drinking just fine.  His blood pressure is normally in the low 100s and is now running in the high 25K systolic today.  Besides the pain and swelling in that right foot being slightly worse per patient he denies any other symptoms today.  He denies any fevers or chills.  Relevant past medical, surgical, family and social history reviewed and updated as indicated. Interim medical history since our last visit reviewed. Allergies and medications reviewed and updated.  Review of Systems  Constitutional: Negative for chills and fever.  Eyes: Negative for visual disturbance.  Respiratory: Negative for shortness of breath and wheezing.   Cardiovascular: Positive for leg swelling. Negative for chest pain.  Musculoskeletal: Negative for back pain and gait problem.  Skin: Positive for color change and wound. Negative for rash.  All other systems reviewed and are negative.   Per HPI unless specifically indicated above   Allergies as of 06/10/2018   No Known Allergies     Medication List       Accurate as of June 10, 2018  9:24 AM. If you have any questions, ask your nurse or doctor.        STOP taking these medications   sulfamethoxazole-trimethoprim 800-160 MG tablet Commonly known as:  BACTRIM DS Stopped by:  Fransisca Kaufmann Kent Favia, MD     TAKE these medications   atorvastatin 40 MG tablet  Commonly known as:  LIPITOR Take 1 tablet (40 mg total) by mouth daily at 6 PM.   cholecalciferol 1000 units tablet Commonly known as:  VITAMIN D Take 2,000 Units by mouth daily.   doxycycline 100 MG tablet Commonly known as:  VIBRA-TABS Take 1 tablet (100 mg total) by mouth 2 (two) times daily. 1 po bid   ferrous sulfate 325 (65 FE) MG tablet Take 1 tablet (325 mg total) by mouth 2 (two) times daily with a meal.   folic acid 1 MG tablet Commonly known as:  FOLVITE Take 1 tablet (1 mg total) by mouth daily at 2 PM.   gabapentin 300 MG capsule Commonly known as:  NEURONTIN TAKE 2 CAPSULES (600 MG TOTAL) BY MOUTH 3 (THREE) TIMES DAILY.   Klor-Con M20 20 MEQ tablet Generic drug:  potassium chloride SA TAKE 4 TABLETS BY MOUTH 2 TIMES DAILY What changed:  See the new instructions.   lactulose 10 GM/15ML solution Commonly known as:  CHRONULAC Take 15 mLs (10 g total) by mouth 3 (three) times daily.   Medihoney Wound/Burn Dressing Gel Apply 1 application topically daily.   sertraline 50 MG tablet Commonly known as:  ZOLOFT Take 1 tablet (50 mg total) by mouth daily.   spironolactone 25 MG tablet Commonly known as:  ALDACTONE Take 1 tablet (25 mg total) by mouth 2 (  two) times daily.   torsemide 20 MG tablet Commonly known as:  DEMADEX TAKE 2 TABLETS TWICE DAILY   traZODone 50 MG tablet Commonly known as:  DESYREL TAKE 1-2 TABLETS BY MOUTH AT BEDTIME AS NEEDED FOR SLEEP.        Objective:   BP (!) 87/47   Pulse 79   Temp 97.7 F (36.5 C) (Oral)   Wt Readings from Last 3 Encounters:  06/02/18 214 lb (97.1 kg)  05/28/18 221 lb (100.2 kg)  05/28/18 224 lb 6.4 oz (101.8 kg)    Physical Exam Vitals signs and nursing note reviewed.  Constitutional:      General: He is not in acute distress.    Appearance: He is well-developed. He is not diaphoretic.  Eyes:     General: No scleral icterus.    Conjunctiva/sclera: Conjunctivae normal.  Neck:     Thyroid: No  thyromegaly.  Musculoskeletal:        General: Swelling (2+ pitting edema weeping clear fluid, worse right than left) present.  Skin:    General: Skin is warm and dry.     Findings: Erythema (Small amount of erythema and purulence overlying the MTP joints on the foot, similar to previous but may be slightly worse.) present. No rash.  Neurological:     Mental Status: He is alert and oriented to person, place, and time.     Coordination: Coordination normal.  Psychiatric:        Behavior: Behavior normal.     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  We will do Unna boots with zinc because we have those now and also placement medi honey around the infected sites. Follow up plan: Return in about 6 days (around 06/16/2018), or if symptoms worsen or fail to improve, for Follow-up cellulitis.  Counseling provided for all of the vaccine components Orders Placed This Encounter  Procedures  . CBC with Differential/Platelet  . [place] Analiya Porco, MD [facility] 06/10/2018, 9:24 AM

## 2018-06-11 ENCOUNTER — Ambulatory Visit: Payer: 59 | Admitting: Gastroenterology

## 2018-06-12 ENCOUNTER — Telehealth: Payer: 59 | Admitting: Student

## 2018-06-15 ENCOUNTER — Encounter: Payer: Self-pay | Admitting: Physician Assistant

## 2018-06-15 ENCOUNTER — Telehealth: Payer: Self-pay | Admitting: Family Medicine

## 2018-06-15 ENCOUNTER — Telehealth (INDEPENDENT_AMBULATORY_CARE_PROVIDER_SITE_OTHER): Payer: 59 | Admitting: Physician Assistant

## 2018-06-15 VITALS — BP 111/54 | HR 76 | Ht 68.0 in | Wt 210.0 lb

## 2018-06-15 DIAGNOSIS — Z79899 Other long term (current) drug therapy: Secondary | ICD-10-CM

## 2018-06-15 DIAGNOSIS — L03119 Cellulitis of unspecified part of limb: Secondary | ICD-10-CM

## 2018-06-15 DIAGNOSIS — I311 Chronic constrictive pericarditis: Secondary | ICD-10-CM | POA: Diagnosis not present

## 2018-06-15 DIAGNOSIS — R234 Changes in skin texture: Secondary | ICD-10-CM

## 2018-06-15 NOTE — Progress Notes (Signed)
Medication Instructions:  Your physician recommends that you continue on your current medications as directed. Please refer to the Current Medication list given to you today.   Labwork: BMET  AT PCP NEXT WEEK   Testing/Procedures: NONE  Follow-Up: Your physician recommends that you schedule a follow-up appointment in: [facility]   Any Other Special Instructions Will Be Listed Below (If Applicable).     If you need a refill on your cardiac medications before your next appointment, please call your pharmacy.

## 2018-06-15 NOTE — Progress Notes (Signed)
Virtual Visit via Telephone Note   This visit type was conducted due to national recommendations for restrictions regarding the COVID-19 Pandemic (e.g. social distancing) in an effort to limit this patient's exposure and mitigate transmission in our community.  Due to his co-morbid illnesses, this patient is at least at moderate risk for complications without adequate follow up.  This format is felt to be most appropriate for this patient at this time.  The patient did not have access to video technology/had technical difficulties with video requiring transitioning to audio format only (telephone).  All issues noted in this document were discussed and addressed.  No physical exam could be performed with this format.  Please refer to the patient's chart for his  consent to telehealth for [facility].   Date:  06/15/2018   ID:  Kent Bryant, DOB Jan 09, 1950, MRN [id number]  Patient Location: Home Provider Location: Home  PCP:  Dettinger, Fransisca Kaufmann, MD  Cardiologist:  Carlyle Dolly, MD  Electrophysiologist:  None   Evaluation Performed:  Follow-Up Visit  Chief Complaint:  Follow up swelling  History of Present Illness:    Kent Bryant is a 68 y.o. male with history of constrictive pericarditis followed at [facility].  Dr. Harl Bowie initially saw him during a hospitalization 07/2017 when he presented with anasarca and liver congestion.  Echo was concerning for constrictive pericarditis and [facility] and was turned down by CT surgery for consideration of possible pericardiectomy due to comorbidities.  Dr. Harl Bowie had a telemedicine visit with him 05/28/2018 at which time his weight was 221 pounds which was down 80 pounds since 07/2017.  At his an appointment with the heart failure clinic and was not willing to reschedule.  Dr. Harl Bowie reviewed his studies and the patient was refusing to have MRI because of severe claustrophobia.  He also felt CT surgery may be  reconsidered because of 80 pound weight loss since last year but patient was reluctant to consider this or being followed in our heart failure clinic.  Weight had trended up to 221 so Dr. Harl Bowie changed his Lasix to torsemide 40 mg twice daily.  Patient has lost 11 lbs since torsemide. Swelling is down some but still has some. Legs are being wrapped weekly and wearing una boot. Also on Doxycycline for cellulitis and his sister says he's hands are peeling.  The patient does not have symptoms concerning for COVID-19 infection (fever, chills, cough, or new shortness of breath).    Past Medical History:  Diagnosis Date  . Abnormal CXR (chest x-ray)   . Agoraphobia   . Anxiety   . Bipolar disorder ([place])   . Chronic low back pain   . Coagulopathy ([place])   . COPD (chronic obstructive pulmonary disease) ([place])   . Depression   . Depression   . 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 Insomnia   . Osteopenia   . Peripheral edema   . Peripheral neuropathy   . Peripheral neuropathy   . Vitamin D deficiency    Past Surgical History:  Procedure Laterality Date  . CATARACT EXTRACTION  05/29/2017  . CATARACT EXTRACTION W/PHACO Left 06/13/2017   Procedure: CATARACT EXTRACTION PHACO AND INTRAOCULAR LENS PLACEMENT LEFT EYE;  Surgeon: Baruch Goldmann, MD;  Location: [facility];  Service: Ophthalmology;  Laterality: Left;  CDE: 23.32     Current Meds  Medication Sig  . atorvastatin (LIPITOR) 40 MG tablet Take 1 tablet (40 mg total) by mouth daily at 6  PM.  . cholecalciferol (VITAMIN D) 1000 units tablet Take 2,000 Units by mouth daily.  Marland Kitchen doxycycline (VIBRA-TABS) 100 MG tablet Take 1 tablet (100 mg total) by mouth 2 (two) times daily. 1 po bid  . ferrous sulfate 325 (65 FE) MG tablet Take 1 tablet (325 mg total) by mouth 2 (two) times daily with a meal.  . folic acid (FOLVITE) 1 MG tablet Take 1 tablet (1 mg total) by mouth daily at 2 PM.  . gabapentin (NEURONTIN) 300 MG capsule TAKE 2 CAPSULES (600 MG TOTAL) BY MOUTH 3  (THREE) TIMES DAILY.  Marland Kitchen KLOR-CON M20 20 MEQ tablet TAKE 4 TABLETS BY MOUTH 2 TIMES DAILY (Patient taking differently: 2 tablets BID)  . lactulose (CHRONULAC) 10 GM/15ML solution Take 15 mLs (10 g total) by mouth 3 (three) times daily.  . sertraline (ZOLOFT) 50 MG tablet Take 1 tablet (50 mg total) by mouth daily.  Marland Kitchen spironolactone (ALDACTONE) 25 MG tablet Take 1 tablet (25 mg total) by mouth 2 (two) times daily.  Marland Kitchen torsemide (DEMADEX) 20 MG tablet TAKE 2 TABLETS TWICE DAILY  . traZODone (DESYREL) 50 MG tablet TAKE 1-2 TABLETS BY MOUTH AT BEDTIME AS NEEDED FOR SLEEP.  . Wound Dressings (MEDIHONEY WOUND/BURN DRESSING) GEL Apply 1 application topically daily.     Allergies:   Patient has no known allergies.   Social History   Tobacco Use  . Smoking status: Current Every Day Smoker    Packs/day: 0.50    Years: 15.00    Pack years: 7.50    Types: Cigarettes    Last attempt to quit: 07/07/2017    Years since quitting: 0.9  . Smokeless tobacco: Never Used  Substance Use Topics  . Alcohol use: No  . Drug use: No     Family Hx: The patient's family history includes Stroke in his mother. There is no history of Colon cancer, Esophageal cancer, Liver disease, Pancreatic cancer, or Stomach cancer.  ROS:   Please see the history of present illness.      All other systems reviewed and are negative.   Prior CV studies:   The following studies were reviewed today:   07/2017 echo Study Conclusions   - Left ventricle: The cavity size was normal. Wall thickness was   normal. Systolic function was normal. The estimated ejection   fraction was in the range of 55% to 60%. Mixed diastolic   findings. High E/A ratio with borderline low deceleration time,   blunting of pulmonary venous flow, normal left atrium, normal   tissue velocities. There is a septal bounce. - Aortic valve: Valve area (VTI): 1.88 cm^2. Valve area (Vmax):   1.86 cm^2. Valve area (Vmean): 1.77 cm^2. - Mitral valve: There  was mild regurgitation. - Pulmonary veins: There is systolic blunting of pulmonary vein   flow consistent with elevated LA pressure. - Right ventricle: There is intermittent flattening of the   ventricular septum that appears to vary with the respriatory   cycle. - There are some signs concerning for possible constrictive   pericarditis. Will order repeat limited study to evaluate   respiratory variation of inflow as well as repeat mitral inflow,   tissue velocities, obtain hepatic flow Dopplers.   10/2017 CT A/P IMPRESSION: 1. Although there is only subtle lobularity of the hepatic margin, there are considerable findings of portal venous hypertension including uphill paraesophageal varices, recanalization of the umbilical vein, and splenorenal shunting. These factors raise suspicion for underlying cirrhotic liver disease. 2. Contracted gallbladder  with small gallstones. 3. Third spacing of fluid with mesenteric and subcutaneous edema, upper abdominal ascites, and right greater than left pleural effusions. 4. Findings of prominent prior calcific pericarditis, with dense sheets of calcification along the pericardial margins. These outline several collections of complex fluid including a 97 cubic cm anterior inferior pericardial fluid collection. 5. Pelvic adenopathy in addition to mildly enlarged retroperitoneal adenopathy. While some of this could well be reactive adenopathy from cirrhosis, the pelvic adenopathy in particular is nonspecific and entity such as lymphoma are not readily excluded based on imaging. 6. Impingement at L4-5 and H2-C9. 7. Umbilical hernia containing adipose tissue and vessels.     [place] studies 07/2017 echo SUMMARY The left ventricular size is normal. There is normal left ventricular wall thickness.  Left ventricular systolic function is normal. LV ejection fraction = 65-70%. No segmental wall motion abnormalities seen in the left ventricle.  Septal bounce is noted.  Left ventricular filling pattern is indeterminate. The right ventricular cavity is small on RV focused A4c views due to  extrinsic compression from a bright echodensity better visualized by prior CT  chests. The right ventricular systolic function is normal. The left atrium is mildly dilated.  Right atrial size is normal with no evidence of collapse on diastole. There is mild aortic and mitral regurgitation. The IVC is mildly dilated with an abnormal collapsibility index, this  suggestive of increased right atrial pressure. There is a ascites present. Prior study, dated 06/26/17, is a limited study, however no significant change  is noted.     06/2017 CT PET 1.  No hypermetabolic adenopathy to suggest aggressive lymphoma. PET/CT is limited in evaluation for indolent lymphoma. 2.  Decreased size of the hematoma along the left chest wall.  Mildly increased uptake within the right semitendinosus musculature and left lower quadrant abdominal wall, favored to relate to additional sites of contusion/hematoma.   06/2017 CT chest CONCLUSION:  Moderate bilateral pleural effusions have increased slightly.  Slight interval worsening of atelectasis associated with pleural effusions. The left lower lobe and lingula are densely opacified. Pneumonia or aspiration could be superimposed on atelectasis.  Unchanged extensive coarse pericardial calcification, likely related to remote hematoma. This places the patient at risk for constrictive physiology. There is a suggestion of distention of the inferior vena cava.  Patulous esophagus with fluid level in the mid thoracic esophagus, placing the patient risk for aspiration.  Interval development of small volume ascites.  Body wall edema has improved slightly. Left chest wall hematoma has improved slightly.  Suspected development of bilateral hemarthrosis in the partially visualized glenohumeral joints     06/2017 US guided  lymph node biopsy  1.  Technically successful U/S guided fine-needle aspiration of left inguinal lymph node. 2.  No immediate complications. 3.  Samples were sent to Pathology.    06/2017 CTA chest A/P 1. Large left posterolateral chest wall hematoma. Limited evaluation for active extravasation due to   incorrect bolus timing. If there are clinical findings of active extravasation, a follow-up chest CTA could be performed. 2. Numerous axillary, mediastinal, abdominal and pelvic lymph nodes. Bilateral inguinal lymph nodes and right pelvic lymph nodes are enlarged as described above. The differential includes lymphoma or infectious/inflammatory etiologies. Given the distribution, metastatic disease is felt to be less likely. 3. Extensive pericardial calcification which is associated with constrictive pericarditis. Echocardiogram may be helpful for further evaluation. 4. Small bilateral pleural effusions with bibasilar atelectasis.     Labs/Other Tests and Data Reviewed:  EKG:  No ECG reviewed.  Recent Labs: 07/22/2017: B Natriuretic Peptide 326.0 09/19/2017: Pro B Natriuretic peptide (BNP) 312.0; TSH 2.20 12/16/2017: Magnesium 1.9 06/10/2018: ALT 21; BUN 19; Creatinine, Ser 1.33; Hemoglobin 12.4; Platelets 146; Potassium 4.1; Sodium 131   Recent Lipid Panel Lab Results  Component Value Date/Time   CHOL 104 01/21/2018 04:32 PM   TRIG 47 01/21/2018 04:32 PM   TRIG 131 06/08/2014 10:24 AM   HDL 38 (L) 01/21/2018 04:32 PM   HDL 27 (L) 06/08/2014 10:24 AM   CHOLHDL 2.7 01/21/2018 04:32 PM   LDLCALC 57 01/21/2018 04:32 PM   LDLCALC 90 10/21/2013 03:54 PM    Wt Readings from Last 3 Encounters:  06/15/18 210 lb (95.3 kg)  06/02/18 214 lb (97.1 kg)  05/28/18 221 lb (100.2 kg)     Objective:    Vital Signs:  BP (!) 111/54   Pulse 76   Ht 5\' 8"  (1.727 m)   Wt 210 lb (95.3 kg)   BMI 31.93 kg/m    VITAL SIGNS:  reviewed  ASSESSMENT & PLAN:    1. Constrictive pericarditis weight  down 80 pounds since 07/2017.  Weight had started to trend up and Dr. Harl Bowie change Lasix to torsemide 40 mg twice daily 05/28/2018. Crt 1.33. Weight down 11 lbs. Legs wrapped.   Patient unwilling to consider MRI secondary to claustrophobia, talking with CT surgery again about possible surgery or being followed in the heart failure clinic. 2. Cellulitis on Doxycycline 3. Hands peeling since started on Doxycycline and torsemide. A bit concerned about SJS. Have asked them to call PCP for in office visit as I can't evaluate over the phone.  COVID-19 Education: The signs and symptoms of COVID-19 were discussed with the patient and how to seek care for testing (follow up with PCP or arrange E-visit).   The importance of social distancing was discussed today.  Time:   Today, I have spent 20  minutes with the patient with telehealth technology discussing the above problems.     Medication Adjustments/Labs and Tests Ordered: Current medicines are reviewed at length with the patient today.  Concerns regarding medicines are outlined above.   Tests Ordered: No orders of the defined types were placed in this encounter.   Medication Changes: No orders of the defined types were placed in this encounter.   Disposition:  Follow up in 4 week(s) Dr Harl Bowie  Signed, Ermalinda Barrios, PA-C  06/15/2018 12:47 PM    [place]

## 2018-06-15 NOTE — Addendum Note (Signed)
Addended by: Debbora Lacrosse R on: 06/15/2018 01:37 PM   Modules accepted: Orders

## 2018-06-16 ENCOUNTER — Other Ambulatory Visit: Payer: Self-pay

## 2018-06-16 ENCOUNTER — Ambulatory Visit (INDEPENDENT_AMBULATORY_CARE_PROVIDER_SITE_OTHER): Payer: 59 | Admitting: *Deleted

## 2018-06-16 VITALS — Ht 68.0 in | Wt 210.0 lb

## 2018-06-16 DIAGNOSIS — Z0000 Encounter for general adult medical examination without abnormal findings: Secondary | ICD-10-CM

## 2018-06-16 NOTE — Progress Notes (Signed)
MEDICARE ANNUAL WELLNESS VISIT  06/16/2018  Telephone Visit Disclaimer This Medicare AWV was conducted by telephone due to national recommendations for restrictions regarding the COVID-19 Pandemic (e.g. social distancing).  I verified, using two identifiers, that I am speaking with Kent Bryant or their authorized healthcare agent. I discussed the limitations, risks, security, and privacy concerns of performing an evaluation and management service by telephone and the potential availability of an in-person appointment in the future. The patient expressed understanding and agreed to proceed.   Subjective:  Kent Bryant is a 68 y.o. male patient of Dettinger, Fransisca Kaufmann, MD who had a Medicare Annual Wellness Visit today via telephone. Kent Bryant is Disabled and Legally disabled and lives with their family. he has 2 children. he reports that he is socially active and does interact with friends/family regularly. he is not physically active and enjoys music.  Patient Care Team: Dettinger, Fransisca Kaufmann, MD as PCP - General (Family Medicine) Harl Bowie Alphonse Guild, MD as PCP - Cardiology (Cardiology) Charolette Forward, MD as Consulting Physician (Cardiology) Mansouraty, Telford Nab., MD as Consulting Physician (Gastroenterology) Larey Dresser, MD as Consulting Physician (Cardiology)  Advanced Directives 06/16/2018 07/22/2017 07/22/2017 06/13/2017 06/10/2017  Does Patient Have a Medical Advance Directive? No No No No No  Does patient want to make changes to medical advance directive? Yes (MAU/Ambulatory/Procedural Areas - Information given) - - - -  Would patient like information on creating a medical advance directive? - No - Patient declined - No - Patient declined No - Patient declined    Hospital Utilization Over the Past 12 Months: # of hospitalizations or ER visits: 2 # of surgeries: 0  Review of Systems    Patient reports that his overall health is unchanged compared to last year.  Patient Reported Readings  (BP, Pulse, CBG, Weight, etc) none  Review of Systems: History obtained from chart review and the patient General ROS: negative  All other systems negative.  Pain Assessment Pain : 0-10 Pain Score: 7  Pain Type: Acute pain Pain Location: Foot Pain Orientation: Right, Left Pain Descriptors / Indicators: Tingling, Sharp Pain Onset: More than a month ago Pain Frequency: Constant     Current Medications & Allergies (verified) Allergies as of 06/16/2018   No Known Allergies     Medication List       Accurate as of June 16, 2018  3:52 PM. If you have any questions, ask your nurse or doctor.        atorvastatin 40 MG tablet Commonly known as:  LIPITOR Take 1 tablet (40 mg total) by mouth daily at 6 PM.   cholecalciferol 1000 units tablet Commonly known as:  VITAMIN D Take 2,000 Units by mouth daily.   doxycycline 100 MG tablet Commonly known as:  VIBRA-TABS Take 1 tablet (100 mg total) by mouth 2 (two) times daily. 1 po bid   ferrous sulfate 325 (65 FE) MG tablet Take 1 tablet (325 mg total) by mouth 2 (two) times daily with a meal.   folic acid 1 MG tablet Commonly known as:  FOLVITE Take 1 tablet (1 mg total) by mouth daily at 2 PM.   gabapentin 300 MG capsule Commonly known as:  NEURONTIN TAKE 2 CAPSULES (600 MG TOTAL) BY MOUTH 3 (THREE) TIMES DAILY.   Klor-Con M20 20 MEQ tablet Generic drug:  potassium chloride SA TAKE 4 TABLETS BY MOUTH 2 TIMES DAILY What changed:  See the new instructions.   lactulose 10 GM/15ML solution Commonly known  as:  CHRONULAC Take 15 mLs (10 g total) by mouth 3 (three) times daily.   Medihoney Wound/Burn Dressing Gel Apply 1 application topically daily.   sertraline 50 MG tablet Commonly known as:  ZOLOFT Take 1 tablet (50 mg total) by mouth daily.   spironolactone 25 MG tablet Commonly known as:  ALDACTONE Take 1 tablet (25 mg total) by mouth 2 (two) times daily.   torsemide 20 MG tablet Commonly known as:  DEMADEX  TAKE 2 TABLETS TWICE DAILY   traZODone 50 MG tablet Commonly known as:  DESYREL TAKE 1-2 TABLETS BY MOUTH AT BEDTIME AS NEEDED FOR SLEEP.       History (reviewed): Past Medical History:  Diagnosis Date  . Abnormal CXR (chest x-ray)   . Agoraphobia   . Anxiety   . Bipolar disorder ([place])   . Chronic low back pain   . Coagulopathy ([place])   . COPD (chronic obstructive pulmonary disease) ([place])   . Depression   . Depression   . 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 Insomnia   . Osteopenia   . Peripheral edema   . Peripheral neuropathy   . Peripheral neuropathy   . Vitamin D deficiency    Past Surgical History:  Procedure Laterality Date  . CATARACT EXTRACTION  05/29/2017  . CATARACT EXTRACTION W/PHACO Left 06/13/2017   Procedure: CATARACT EXTRACTION PHACO AND INTRAOCULAR LENS PLACEMENT LEFT EYE;  Surgeon: Baruch Goldmann, MD;  Location: [facility];  Service: Ophthalmology;  Laterality: Left;  CDE: 23.32   Family History  Problem Relation Age of Onset  . Stroke Mother   . Colon cancer Neg Hx   . Esophageal cancer Neg Hx   . Liver disease Neg Hx   . Pancreatic cancer Neg Hx   . Stomach cancer Neg Hx    Social History   Socioeconomic History  . Marital status: Divorced    Spouse name: Not on file  . Number of children: 2  . Years of education: Not on file  . Highest education level: 10th grade  Occupational History  . Occupation: Disabled  Social Needs  . Financial resource strain: Not hard at all  . Food insecurity:    Worry: Never true    Inability: Never true  . Transportation needs:    Medical: No    Non-medical: No  Tobacco Use  . Smoking status: Current Every Day Smoker    Packs/day: 0.50    Years: 15.00    Pack years: 7.50    Types: Cigarettes    Last attempt to quit: 07/07/2017    Years since quitting: 0.9  . Smokeless tobacco: Never Used  Substance and Sexual Activity  . Alcohol use: No  . Drug use: No  . Sexual activity: Not Currently    Birth control/protection:  None  Lifestyle  . Physical activity:    Days per week: 0 days    Minutes per session: 0 min  . Stress: Only a little  Relationships  . Social connections:    Talks on phone: More than three times a week    Gets together: Never    Attends religious service: Never    Active member of club or organization: No    Attends meetings of clubs or organizations: Never    Relationship status: Divorced  Other Topics Concern  . Not on file  Social History Narrative  . Not on file    Activities of Daily Living In your present state of health, do you have any  difficulty performing the following activities: 06/16/2018 07/22/2017  Hearing? N N  Vision? Y N  Difficulty concentrating or making decisions? N N  Walking or climbing stairs? Y Y  Dressing or bathing? Y N  Doing errands, shopping? Kent Bryant  Preparing Food and eating ? Y -  Using the Toilet? N -  In the past six months, have you accidently leaked urine? N -  Do you have problems with loss of bowel control? N -  Managing your Medications? N -  Managing your Finances? N -  Housekeeping or managing your Housekeeping? N -  Some recent data might be hidden    Patient Literacy How often do you need to have someone help you when you read instructions, pamphlets, or other written materials from your doctor or pharmacy?: 1 - Never  Exercise Current Exercise Habits: The patient does not participate in regular exercise at present, Exercise limited by: orthopedic condition(s)  Diet Patient reports consuming 3 meals a day and 2 snack(s) a day Patient reports that his primary diet is: Regular Patient reports that she does have regular access to food.   Depression Screen PHQ 2/9 Scores 06/16/2018 02/20/2018 01/21/2018 11/05/2017 08/28/2017 08/08/2017 06/18/2017  PHQ - 2 Score 0 0 1 0 0 0 1  PHQ- 9 Score - - - - - - -     Fall Risk Fall Risk  06/16/2018 06/16/2018 02/20/2018 01/21/2018 08/28/2017  Falls in the past year? 1 0 0 0 Yes  Number falls in past  yr: 0 - - - 1  Comment - - - - -  Injury with Fall? 0 - - - No     Objective:  Kent Bryant seemed alert and oriented and he participated appropriately during our telephone visit.  Blood Pressure Weight BMI  BP Readings from Last 3 Encounters:  06/15/18 (!) 111/54  06/10/18 (!) 87/47  06/02/18 102/60   Wt Readings from Last 3 Encounters:  06/16/18 210 lb (95.3 kg)  06/15/18 210 lb (95.3 kg)  06/02/18 214 lb (97.1 kg)   BMI Readings from Last 1 Encounters:  06/16/18 31.93 kg/m    *Unable to obtain current vital signs, weight, and BMI due to telephone visit type  Hearing/Vision  . Kent Bryant did not seem to have difficulty with hearing/understanding during the telephone conversation . Reports that he has not had a formal eye exam by an eye care professional within the past year . Reports that he has not had a formal hearing evaluation within the past year *Unable to fully assess hearing and vision during telephone visit type  Cognitive Function: 6CIT Screen 06/16/2018  What Year? 0 points  What month? 0 points  What time? 0 points  Count back from 20 0 points  Months in reverse 0 points  Repeat phrase 0 points  Total Score 0    Normal Cognitive Function Screening: Yes (Normal:0-7, Significant for Dysfunction: >8)  Immunization & Health Maintenance Record Immunization History  Administered Date(s) Administered  . Influenza, High Dose Seasonal PF 11/12/2017  . Influenza,inj,Quad PF,6+ Mos 02/08/2016, 02/28/2017  . Pneumococcal Conjugate-13 02/28/2017  . Tdap 08/28/2016    Health Maintenance  Topic Date Due  . COLON CANCER SCREENING ANNUAL FOBT  11/09/2001  . COLONOSCOPY  11/09/2001  . PNA vac Low Risk Adult (2 of 2 - PPSV23) 02/28/2018  . INFLUENZA VACCINE  08/08/2018  . TETANUS/TDAP  08/29/2026  . Hepatitis C Screening  Completed       Assessment  This is a  routine wellness examination for Kent Bryant.  Health Maintenance: Due or Overdue Health Maintenance Due   Topic Date Due  . COLON CANCER SCREENING ANNUAL FOBT  11/09/2001  . COLONOSCOPY  11/09/2001  . PNA vac Low Risk Adult (2 of 2 - PPSV23) 02/28/2018    Kent Bryant does not need a referral for Community Assistance: Care Management:   no Social Work:    no Prescription Assistance:  no Nutrition/Diabetes Education:  no   Plan:  Personalized Goals Goals Addressed            This Visit's Progress   . Exercise 3x per week (30 min per time)        Personalized Health Maintenance & Screening Recommendations  Pneumococcal vaccine  Colorectal cancer screening Advanced directives: has NO advanced directive  - add't info requested. Referral to SW: no  Lung Cancer Screening Recommended: no (Low Dose CT Chest recommended if Age 57-80 years, 30 pack-year currently smoking OR have quit w/in past 15 years) Hepatitis C Screening recommended: no HIV Screening recommended: no  Advanced Directives: Written information was prepared per patient's request.  Referrals & Orders No orders of the defined types were placed in this encounter.   Follow-up Plan . Follow-up with Dettinger, Fransisca Kaufmann, MD as planned    I have personally reviewed and noted the following in the patient's chart:   . Medical and social history . Use of alcohol, tobacco or illicit drugs  . Current medications and supplements . Functional ability and status . Nutritional status . Physical activity . Advanced directives . List of other physicians . Hospitalizations, surgeries, and ER visits in previous 12 months . Vitals . Screenings to include cognitive, depression, and falls . Referrals and appointments  In addition, I have reviewed and discussed with Kent Bryant certain preventive protocols, quality metrics, and best practice recommendations. A written personalized care plan for preventive services as well as general preventive health recommendations is available and can be mailed to the patient at his request.       Wardell Heath  06/16/2018     I have reviewed and agree with the above AWV documentation.   [name], FNP

## 2018-06-16 NOTE — Patient Instructions (Signed)
  Kent Bryant , Thank you for taking time to come for your Medicare Wellness Visit. I appreciate your ongoing commitment to your health goals. Please review the following plan we discussed and let me know if I can assist you in the future.   These are the goals we discussed: Goals    . Exercise 3x per week (30 min per time)       This is a list of the screening recommended for you and due dates:  Health Maintenance  Topic Date Due  . Stool Blood Test  11/09/2001  . Colon Cancer Screening  11/09/2001  . Pneumonia vaccines (2 of 2 - PPSV23) 02/28/2018  . Flu Shot  08/08/2018  . Tetanus Vaccine  08/29/2026  .  Hepatitis C: One time screening is recommended by Center for Disease Control  (CDC) for  adults born from [birth year] through 1964.   Completed

## 2018-06-17 ENCOUNTER — Encounter: Payer: Self-pay | Admitting: Family Medicine

## 2018-06-17 ENCOUNTER — Ambulatory Visit (INDEPENDENT_AMBULATORY_CARE_PROVIDER_SITE_OTHER): Payer: 59 | Admitting: Family Medicine

## 2018-06-17 VITALS — BP 90/51 | HR 75 | Temp 97.5°F

## 2018-06-17 DIAGNOSIS — L03115 Cellulitis of right lower limb: Secondary | ICD-10-CM | POA: Diagnosis not present

## 2018-06-17 DIAGNOSIS — L03116 Cellulitis of left lower limb: Secondary | ICD-10-CM

## 2018-06-17 DIAGNOSIS — R609 Edema, unspecified: Secondary | ICD-10-CM

## 2018-06-17 MED ORDER — SULFAMETHOXAZOLE-TRIMETHOPRIM 800-160 MG PO TABS: 1.0000 | ORAL_TABLET | Freq: Two times a day (BID) | ORAL | 0 refills | Status: DC

## 2018-06-17 NOTE — Progress Notes (Signed)
BP (!) 90/51    Pulse 75    Temp (!) 97.5 F (36.4 C) (Oral)    Subjective:   Patient ID: Kent Bryant, male    DOB: [birth year], 68 y.o.   MRN: [id number]  HPI: Kent Bryant is a 69 y.o. male presenting on 06/17/2018 for Cellulitis (6 day re check)   HPI Patient is coming in today for wound care and cellulitis recheck. Patient is coming in with complaints of increased odor and increased rash and irritation on both his hands and his legs, he did see cardiologist other day and they are concerned that it could have been the antibiotic that was causing it so recommended for him to stop it and he did stop it but that the odor has come up and is increased.  He has wound of both of his legs.  He and his sister who is here helping take care of him feel like things are worsening.  Relevant past medical, surgical, family and social history reviewed and updated as indicated. Interim medical history since our last visit reviewed. Allergies and medications reviewed and updated.  Review of Systems  Constitutional: Negative for chills and fever.  Respiratory: Negative for shortness of breath and wheezing.   Cardiovascular: Negative for chest pain and leg swelling.  Musculoskeletal: Negative for back pain and gait problem.  Skin: Positive for color change, rash and wound.  All other systems reviewed and are negative.   Per HPI unless specifically indicated above   Allergies as of 06/17/2018   No Known Allergies     Medication List       Accurate as of June 17, 2018 11:59 AM. If you have any questions, ask your nurse or doctor.        STOP taking these medications   doxycycline 100 MG tablet Commonly known as:  VIBRA-TABS Stopped by:  Fransisca Kaufmann Ruthe Roemer, MD     TAKE these medications   atorvastatin 40 MG tablet Commonly known as:  LIPITOR Take 1 tablet (40 mg total) by mouth daily at 6 PM.   cholecalciferol 1000 units tablet Commonly known as:  VITAMIN D Take 2,000 Units by mouth  daily.   ferrous sulfate 325 (65 FE) MG tablet Take 1 tablet (325 mg total) by mouth 2 (two) times daily with a meal.   folic acid 1 MG tablet Commonly known as:  FOLVITE Take 1 tablet (1 mg total) by mouth daily at 2 PM.   gabapentin 300 MG capsule Commonly known as:  NEURONTIN TAKE 2 CAPSULES (600 MG TOTAL) BY MOUTH 3 (THREE) TIMES DAILY.   Klor-Con M20 20 MEQ tablet Generic drug:  potassium chloride SA TAKE 4 TABLETS BY MOUTH 2 TIMES DAILY What changed:  See the new instructions.   lactulose 10 GM/15ML solution Commonly known as:  CHRONULAC Take 15 mLs (10 g total) by mouth 3 (three) times daily.   Medihoney Wound/Burn Dressing Gel Apply 1 application topically daily.   sertraline 50 MG tablet Commonly known as:  ZOLOFT Take 1 tablet (50 mg total) by mouth daily.   spironolactone 25 MG tablet Commonly known as:  ALDACTONE Take 1 tablet (25 mg total) by mouth 2 (two) times daily.   torsemide 20 MG tablet Commonly known as:  DEMADEX TAKE 2 TABLETS TWICE DAILY   traZODone 50 MG tablet Commonly known as:  DESYREL TAKE 1-2 TABLETS BY MOUTH AT BEDTIME AS NEEDED FOR SLEEP.        Objective:   BP Marland Kitchen)  90/51    Pulse 75    Temp (!) 97.5 F (36.4 C) (Oral)   Wt Readings from Last 3 Encounters:  06/16/18 210 lb (95.3 kg)  06/15/18 210 lb (95.3 kg)  06/02/18 214 lb (97.1 kg)    Physical Exam Vitals signs and nursing note reviewed.  Constitutional:      General: He is not in acute distress.    Appearance: He is well-developed. He is not diaphoretic.  Eyes:     General: No scleral icterus.    Conjunctiva/sclera: Conjunctivae normal.  Neck:     Thyroid: No thyromegaly.  Skin:    General: Skin is warm and dry.     Findings: Erythema (Erythema and open weeping on bilateral lower extremities, worse on the right foot than the left, peeling skin) and rash (Scaly rash on inner thighs and on hands with slight pink discoloration on inner thighs.) present.  Neurological:       Mental Status: He is alert and oriented to person, place, and time.     Coordination: Coordination normal.  Psychiatric:        Behavior: Behavior normal.    Debrided away some of the dead skin and applied meta honey and wet to dry dressing and applied Ace bandages over top.  Wound care: Apply meta honey to open sites on feet that look infected and then wrapped with Ace bandages for compression on the legs all the way down to the feet, changed twice daily.  Apply wet-to-dry dressings on the seeping parts to prevent the Ace bandages from sticking  Assessment & Plan:   Problem List Items Addressed This Visit      Other   Peripheral edema   Relevant Medications   sulfamethoxazole-trimethoprim (BACTRIM DS) 800-160 MG tablet   Other Relevant Orders   BMP8+EGFR   Ambulatory referral to [facility]    Other Visit Diagnoses    Cellulitis of left leg    -  Primary   Relevant Medications   sulfamethoxazole-trimethoprim (BACTRIM DS) 800-160 MG tablet   Other Relevant Orders   Ambulatory referral to [facility]   Ambulatory referral to Home Health   Cellulitis of right leg       Relevant Medications   sulfamethoxazole-trimethoprim (BACTRIM DS) 800-160 MG tablet   Other Relevant Orders   Ambulatory referral to [facility]      Will do urgent referral to wound care and home health care for home wound and gave a new antibiotic because of possible reaction to the doxycycline. Follow up plan: Return in about 1 week (around 06/24/2018), or if symptoms worsen or fail to improve, for Wound care recheck.  Counseling provided for all of the vaccine components Orders Placed This Encounter  Procedures   BMP8+EGFR   Ambulatory referral to [place], MD [place] 06/17/2018, 11:59 AM

## 2018-06-18 LAB — BMP8+EGFR
BUN/Creatinine Ratio: 17 (ref 10–24)
BUN: 19 mg/dL (ref 8–27)
CO2: 28 mmol/L (ref 20–29)
Calcium: 8.1 mg/dL — ABNORMAL LOW (ref 8.6–10.2)
Chloride: 90 mmol/L — ABNORMAL LOW (ref 96–106)
Creatinine, Ser: 1.1 mg/dL (ref 0.76–1.27)
GFR calc Af Amer: 80 mL/min/{1.73_m2} (ref >59)
GFR calc non Af Amer: 70 mL/min/{1.73_m2} (ref >59)
Glucose: 68 mg/dL (ref 65–99)
Potassium: 3.9 mmol/L (ref 3.5–5.2)
Sodium: 132 mmol/L — ABNORMAL LOW (ref 134–144)

## 2018-06-20 ENCOUNTER — Other Ambulatory Visit: Payer: Self-pay | Admitting: Family Medicine

## 2018-06-20 DIAGNOSIS — F5104 Psychophysiologic insomnia: Secondary | ICD-10-CM

## 2018-06-20 DIAGNOSIS — F325 Major depressive disorder, single episode, in full remission: Secondary | ICD-10-CM

## 2018-06-22 ENCOUNTER — Telehealth: Payer: Self-pay | Admitting: Family Medicine

## 2018-06-22 NOTE — Telephone Encounter (Signed)
Sorry I meant to send this to [employer]

## 2018-06-22 NOTE — Telephone Encounter (Addendum)
Aware sending [place] referral to [facility] now that [place].

## 2018-06-22 NOTE — Telephone Encounter (Signed)
Do you know anything about this Juliann Pulse

## 2018-06-23 ENCOUNTER — Other Ambulatory Visit: Payer: Self-pay

## 2018-06-23 ENCOUNTER — Ambulatory Visit: Payer: 59 | Admitting: *Deleted

## 2018-06-24 ENCOUNTER — Encounter: Payer: Self-pay | Admitting: Family Medicine

## 2018-06-24 ENCOUNTER — Ambulatory Visit: Payer: 59 | Admitting: Gastroenterology

## 2018-06-24 ENCOUNTER — Ambulatory Visit (INDEPENDENT_AMBULATORY_CARE_PROVIDER_SITE_OTHER): Payer: 59 | Admitting: Family Medicine

## 2018-06-24 VITALS — BP 97/59 | HR 71 | Temp 97.0°F

## 2018-06-24 DIAGNOSIS — R609 Edema, unspecified: Secondary | ICD-10-CM | POA: Diagnosis not present

## 2018-06-24 DIAGNOSIS — I872 Venous insufficiency (chronic) (peripheral): Secondary | ICD-10-CM | POA: Diagnosis not present

## 2018-06-24 NOTE — Progress Notes (Signed)
BP (!) 97/59   Pulse 71   Temp (!) 97 F (36.1 C) (Oral)    Subjective:   Patient ID: Kent Bryant, male    DOB: 1950/11/25, 68 y.o.   MRN: [id number]  HPI: Kent Bryant is a 68 y.o. male presenting on 06/24/2018 for Cellulitis (1 week follow up)   HPI Patient is coming in for recheck of bilateral lower extremity edema with weeping.  They did go to see wound care and they put some kind a different dressing on which we do not have and they are not going back to wound care until 3 weeks and he has not been able to get home health care because of where they live we have tried 2 different companies and will try and call third 1 at this point.  Relevant past medical, surgical, family and social history reviewed and updated as indicated. Interim medical history since our last visit reviewed. Allergies and medications reviewed and updated.  Review of Systems  Constitutional: Negative for chills and fever.  Respiratory: Negative for shortness of breath and wheezing.   Cardiovascular: Negative for chest pain and leg swelling.  Musculoskeletal: Negative for gait problem.  Skin: Positive for color change and wound. Negative for rash.  All other systems reviewed and are negative.   Per HPI unless specifically indicated above   Allergies as of 06/24/2018   No Known Allergies     Medication List       Accurate as of June 24, 2018  2:23 PM. If you have any questions, ask your nurse or doctor.        atorvastatin 40 MG tablet Commonly known as: LIPITOR Take 1 tablet (40 mg total) by mouth daily at 6 PM.   cholecalciferol 1000 units tablet Commonly known as: VITAMIN D Take 2,000 Units by mouth daily.   ferrous sulfate 325 (65 FE) MG tablet Take 1 tablet (325 mg total) by mouth 2 (two) times daily with a meal.   folic acid 1 MG tablet Commonly known as: FOLVITE Take 1 tablet (1 mg total) by mouth daily at 2 PM.   gabapentin 300 MG capsule Commonly known as: NEURONTIN TAKE 2  CAPSULES (600 MG TOTAL) BY MOUTH 3 (THREE) TIMES DAILY.   Klor-Con M20 20 MEQ tablet Generic drug: potassium chloride SA TAKE 4 TABLETS BY MOUTH 2 TIMES DAILY What changed: See the new instructions.   lactulose 10 GM/15ML solution Commonly known as: CHRONULAC Take 15 mLs (10 g total) by mouth 3 (three) times daily.   Medihoney Wound/Burn Dressing Gel Apply 1 application topically daily.   sertraline 50 MG tablet Commonly known as: ZOLOFT Take 1 tablet (50 mg total) by mouth daily.   spironolactone 25 MG tablet Commonly known as: ALDACTONE Take 1 tablet (25 mg total) by mouth 2 (two) times daily.   sulfamethoxazole-trimethoprim 800-160 MG tablet Commonly known as: BACTRIM DS Take 1 tablet by mouth 2 (two) times daily.   torsemide 20 MG tablet Commonly known as: DEMADEX TAKE 2 TABLETS TWICE DAILY   traZODone 50 MG tablet Commonly known as: DESYREL TAKE 1 TO 2 TABLETS BY MOUTH AT BEDTIME AS NEEDED FOR SLEEP   triamcinolone ointment 0.1 % Commonly known as: KENALOG   triamcinolone cream 0.1 % Commonly known as: KENALOG        Objective:   BP (!) 97/59   Pulse 71   Temp (!) 97 F (36.1 C) (Oral)   Wt Readings from Last 3 Encounters:  06/16/18  210 lb (95.3 kg)  06/15/18 210 lb (95.3 kg)  06/02/18 214 lb (97.1 kg)    Physical Exam Vitals signs and nursing note reviewed.  Constitutional:      General: He is not in acute distress.    Appearance: He is well-developed. He is not diaphoretic.  Eyes:     General: No scleral icterus.    Conjunctiva/sclera: Conjunctivae normal.  Neck:     Thyroid: No thyromegaly.  Musculoskeletal: Normal range of motion.  Skin:    General: Skin is warm and dry.     Findings: No rash.  Neurological:     Mental Status: He is alert and oriented to person, place, and time.     Coordination: Coordination normal.  Psychiatric:        Behavior: Behavior normal.       Assessment & Plan:   Problem List Items Addressed This  Visit      Other   Peripheral edema - Primary    Other Visit Diagnoses    Stasis dermatitis of both legs          Slight improvement from previous, will place wet-to-dry dressings using Xeroform gauze and will place topically triamcinolone and will have him change the dressings every 2 days or daily if possible and placed the triamcinolone twice a week like they instructed him.  And still trying to get home health wound care.  He saw wound care last week but they told him that he will come back in 3 weeks at this point.  We will also place spacers between the toes using rolled up 3 x 3 Follow up plan: Return in about 1 week (around 07/01/2018), or if symptoms worsen or fail to improve, for Wound care recheck.   We will set up nurse triage visit just for dressing change in 2 days  Counseling provided for all of the vaccine components No orders of the defined types were placed in this encounter.   Caryl Pina, MD [facility] 06/24/2018, 2:23 PM

## 2018-06-26 ENCOUNTER — Encounter: Payer: Self-pay | Admitting: Family Medicine

## 2018-06-26 ENCOUNTER — Other Ambulatory Visit: Payer: Self-pay

## 2018-06-26 ENCOUNTER — Ambulatory Visit (INDEPENDENT_AMBULATORY_CARE_PROVIDER_SITE_OTHER): Payer: 59 | Admitting: Family Medicine

## 2018-06-26 VITALS — BP 93/58 | HR 74 | Temp 97.7°F

## 2018-06-26 DIAGNOSIS — R609 Edema, unspecified: Secondary | ICD-10-CM

## 2018-06-26 DIAGNOSIS — I872 Venous insufficiency (chronic) (peripheral): Secondary | ICD-10-CM

## 2018-06-26 NOTE — Progress Notes (Signed)
   BP (!) 93/58   Pulse 74   Temp 97.7 F (36.5 C) (Oral)    Subjective:   Patient ID: Kent Bryant, male    DOB: [birth year], 68 y.o.   MRN: [id number]  HPI: Kent Bryant is a 68 y.o. male presenting on 06/26/2018 for Cellulitis (2 day re check)   HPI Bilateral lower extremity edema and swelling recheck Patient is coming in for bilateral lower extremity edema and cellulitis and drainage and wound care.  He did get into wound care but does not have an appointment with them until July 6 and was supposed to get home health wound care but that has not been working out.  We think we finally have home health wound care coming by next week.  Patient denies any fevers or chills.  He says he has some soreness on his lateral ankle on the right side.  Relevant past medical, surgical, family and social history reviewed and updated as indicated. Interim medical history since our last visit reviewed. Allergies and medications reviewed and updated.  Review of Systems  Constitutional: Negative for chills and fever.  Respiratory: Negative for wheezing.   Cardiovascular: Positive for leg swelling. Negative for chest pain.  Musculoskeletal: Negative for back pain and gait problem.  Skin: Positive for color change, rash and wound.  All other systems reviewed and are negative.   Per HPI unless specifically indicated above      Objective:   BP (!) 93/58   Pulse 74   Temp 97.7 F (36.5 C) (Oral)   Wt Readings from Last 3 Encounters:  06/16/18 210 lb (95.3 kg)  06/15/18 210 lb (95.3 kg)  06/02/18 214 lb (97.1 kg)    Physical Exam Vitals signs and nursing note reviewed.  Constitutional:      General: He is not in acute distress.    Appearance: He is well-developed. He is not diaphoretic.  Eyes:     General: No scleral icterus.    Conjunctiva/sclera: Conjunctivae normal.  Neck:     Thyroid: No thyromegaly.  Musculoskeletal:        General: Swelling (Bilateral lower extremity edema and  weeping, the purulence around the toes looks to be less than what it was previously.  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  Skin:    General: Skin is warm and dry.     Findings: No rash.  Neurological:     Mental Status: He is alert and oriented to person, place, and time.     Coordination: Coordination normal.     Continue same wraps and change every 2 days, continue with the triamcinolone twice a week like they said and hopefully he will have wound care coming out of the home this next week.  Assessment & Plan:   Problem List Items Addressed This Visit      Other   Peripheral edema - Primary    Other Visit Diagnoses    Stasis dermatitis of both legs          Looking slightly improved, will come back Monday for recheck. Follow up plan: Return in about 3 months (around 09/26/2018), or if symptoms worsen or fail to improve, for Recheck.  Counseling provided for all of the vaccine components No orders of the defined types were placed in this encounter.   Caryl Pina, MD [facility] 06/26/2018, 12:32 PM

## 2018-06-29 ENCOUNTER — Ambulatory Visit (INDEPENDENT_AMBULATORY_CARE_PROVIDER_SITE_OTHER): Payer: 59 | Admitting: Family

## 2018-06-29 ENCOUNTER — Other Ambulatory Visit: Payer: Self-pay

## 2018-06-29 ENCOUNTER — Encounter: Payer: Self-pay | Admitting: Family

## 2018-06-29 VITALS — BP 97/60 | HR 71 | Temp 97.4°F | Ht 68.0 in

## 2018-06-29 DIAGNOSIS — R609 Edema, unspecified: Secondary | ICD-10-CM

## 2018-06-29 DIAGNOSIS — I872 Venous insufficiency (chronic) (peripheral): Secondary | ICD-10-CM | POA: Diagnosis not present

## 2018-06-29 NOTE — Progress Notes (Signed)
   Subjective:    Patient ID: Kent Bryant, male    DOB: 12/26/1950, 67 y.o.   MRN: [id number]  Chief Complaint  Patient presents with  . change dressing on legs    HPI PT presents to the office today for dressing change for lower edema and cellulitis. His sister is present takes care of him and reports the legs are improved, but continues to peel and drain. He is in the process of having home health set up to come to his house for dressing changes. They are hoping to have these set up by Wednesday.  He completed the Bactrim and states the peeling of his hands has improved since stopping the doxycycline.   He continues to have soreness in his later right ankle. Continues to have weeping edema. Had BMP drawn on 06/17/18 that was stable.    Review of Systems  Skin: Positive for wound.  All other systems reviewed and are negative.      Objective:   Physical Exam Constitutional:      Appearance: Normal appearance.  Pulmonary:     Effort: Pulmonary effort is normal.     Breath sounds: Normal breath sounds.  Musculoskeletal:     Right lower leg: Edema present.     Left lower leg: Edema present.     Comments: Pt in wheelchair with generalized weakness  Skin:    Findings: Erythema present.     Comments: Discoloration of bilateral legs, weeping edema present, skin peeling  Neurological:     Mental Status: He is alert.  Psychiatric:        Mood and Affect: Mood normal.        Behavior: Behavior normal.         Dressing removed, bilateral legs cleaned and new dressing applied.    BP 97/60   Pulse 71   Temp (!) 97.4 F (36.3 C) (Oral)   Ht 5\' 8"  (1.727 m)   BMI 31.93 kg/m      Assessment & Plan:  [name] comes in today with chief complaint of change dressing on legs   Diagnosis and orders addressed:  1. Peripheral edema  2. Stasis dermatitis of both legs  Keep follow up with PCP Keep feet elevated Keep appt with wound care and hopefully home health will be  out Wednesday Keep dressing clean and dry Call office if redness, fever, discharge, or pain worsens  [name], FNP

## 2018-06-30 ENCOUNTER — Ambulatory Visit (INDEPENDENT_AMBULATORY_CARE_PROVIDER_SITE_OTHER): Payer: 59

## 2018-06-30 ENCOUNTER — Telehealth: Payer: Self-pay | Admitting: Family Medicine

## 2018-06-30 DIAGNOSIS — I110 Hypertensive heart disease with heart failure: Secondary | ICD-10-CM

## 2018-06-30 DIAGNOSIS — I509 Heart failure, unspecified: Secondary | ICD-10-CM | POA: Diagnosis not present

## 2018-06-30 DIAGNOSIS — R238 Other skin changes: Secondary | ICD-10-CM

## 2018-06-30 DIAGNOSIS — I739 Peripheral vascular disease, unspecified: Secondary | ICD-10-CM

## 2018-06-30 DIAGNOSIS — G9009 Other idiopathic peripheral autonomic neuropathy: Secondary | ICD-10-CM

## 2018-06-30 NOTE — Telephone Encounter (Signed)
The only interactions are to watch the potassium which we have watched.  Patient is on very high dose diuretic that flushes potassium out of his system so continue the Klor-Con and the other medications and check BMP every 2 weeks.  We just checked it a week ago in our office and it was normal.

## 2018-06-30 NOTE — Telephone Encounter (Signed)
Attempted to return Jennifer's call - NA

## 2018-07-01 ENCOUNTER — Ambulatory Visit (INDEPENDENT_AMBULATORY_CARE_PROVIDER_SITE_OTHER): Payer: 59 | Admitting: Family Medicine

## 2018-07-01 ENCOUNTER — Encounter: Payer: Self-pay | Admitting: Family Medicine

## 2018-07-01 ENCOUNTER — Other Ambulatory Visit: Payer: Self-pay

## 2018-07-01 VITALS — BP 96/57 | HR 78 | Temp 97.1°F

## 2018-07-01 DIAGNOSIS — R609 Edema, unspecified: Secondary | ICD-10-CM

## 2018-07-01 DIAGNOSIS — I872 Venous insufficiency (chronic) (peripheral): Secondary | ICD-10-CM

## 2018-07-02 ENCOUNTER — Telehealth: Payer: Self-pay

## 2018-07-02 NOTE — Telephone Encounter (Signed)
Kent Bryant called from [place] and states that patient did not want to mention at his visit the other day that he has been having swelling in his penis x 1 week and it is not getting any better.  Patient was not comfortable saying it in front of family and nurse. Please advise and Kent Bryant would like a call back.

## 2018-07-02 NOTE — Telephone Encounter (Signed)
I would have him discuss this with his liver doctor and cardiologist because this is related to fluid overload, he is already on very high levels of diuretic so I would discuss this with them and see if they would be okay with him taking extra doses.

## 2018-07-02 NOTE — Telephone Encounter (Signed)
Kent Bryant aware and verbalizes understanding.

## 2018-07-03 ENCOUNTER — Ambulatory Visit: Payer: 59 | Admitting: Gastroenterology

## 2018-07-03 ENCOUNTER — Telehealth: Payer: Self-pay | Admitting: *Deleted

## 2018-07-03 NOTE — Telephone Encounter (Signed)
Need verbal order for pt to continue with pt: She would like to see him: 1 x 2 wk, then 2 x 5 wk, then 1 x 2 wk, then stop (or change order)   I gave verbal approval = please note that this is OK>

## 2018-07-04 ENCOUNTER — Other Ambulatory Visit: Payer: Self-pay | Admitting: Family Medicine

## 2018-07-05 NOTE — Progress Notes (Signed)
BP (!) 96/57   Pulse 78   Temp (!) 97.1 F (36.2 C) (Oral)    Subjective:   Patient ID: Kent Bryant, male    DOB: 06/01/1950, 68 y.o.   MRN: [id number]  HPI: Kent Bryant is a 68 y.o. male presenting on 07/01/2018 for peripheral edema (2 day follow up)   HPI Peripheral edema and wounds recheck Patient has peripheral edema and is coming in for wound care recheck for peripheral edema.  He still has a follow-up appointment with wound care next week and has home health set up finally and they will be starting to come out to his house.  As far as the dressings go it seems like it is been improved and there is less odor to the dressing changes.  There is still clear drainage and he still has an ulceration on the right lateral ankle as well as some cracking around the heels of both feet.  The pain is the worse around the right lateral ankle.  Relevant past medical, surgical, family and social history reviewed and updated as indicated. Interim medical history since our last visit reviewed. Allergies and medications reviewed and updated.  Review of Systems  Constitutional: Negative for chills and fever.  Respiratory: Negative for shortness of breath and wheezing.   Cardiovascular: Positive for leg swelling. Negative for chest pain.  Musculoskeletal: Negative for back pain and gait problem.  Skin: Positive for wound. Negative for color change and rash.  All other systems reviewed and are negative.   Per HPI unless specifically indicated above   Allergies as of 07/01/2018      Reactions   Doxycycline Dermatitis, Itching   Petrolatum Rash      Medication List       Accurate as of July 01, 2018 11:59 PM. If you have any questions, ask your nurse or doctor.        atorvastatin 40 MG tablet Commonly known as: LIPITOR Take 1 tablet (40 mg total) by mouth daily at 6 PM.   cholecalciferol 1000 units tablet Commonly known as: VITAMIN D Take 2,000 Units by mouth daily.   ferrous sulfate  325 (65 FE) MG tablet Take 1 tablet (325 mg total) by mouth 2 (two) times daily with a meal.   folic acid 1 MG tablet Commonly known as: FOLVITE Take 1 tablet (1 mg total) by mouth daily at 2 PM.   gabapentin 300 MG capsule Commonly known as: NEURONTIN TAKE 2 CAPSULES (600 MG TOTAL) BY MOUTH 3 (THREE) TIMES DAILY.   Klor-Con M20 20 MEQ tablet Generic drug: potassium chloride SA TAKE 4 TABLETS BY MOUTH 2 TIMES DAILY What changed: See the new instructions.   lactulose 10 GM/15ML solution Commonly known as: CHRONULAC Take 15 mLs (10 g total) by mouth 3 (three) times daily.   Medihoney Wound/Burn Dressing Gel Apply 1 application topically daily.   sertraline 50 MG tablet Commonly known as: ZOLOFT Take 1 tablet (50 mg total) by mouth daily.   spironolactone 25 MG tablet Commonly known as: ALDACTONE Take 1 tablet (25 mg total) by mouth 2 (two) times daily.   sulfamethoxazole-trimethoprim 800-160 MG tablet Commonly known as: BACTRIM DS Take 1 tablet by mouth 2 (two) times daily.   torsemide 20 MG tablet Commonly known as: DEMADEX TAKE 2 TABLETS TWICE DAILY   traZODone 50 MG tablet Commonly known as: DESYREL TAKE 1 TO 2 TABLETS BY MOUTH AT BEDTIME AS NEEDED FOR SLEEP   triamcinolone ointment 0.1 % Commonly known  as: KENALOG   triamcinolone cream 0.1 % Commonly known as: KENALOG        Objective:   BP (!) 96/57   Pulse 78   Temp (!) 97.1 F (36.2 C) (Oral)   Wt Readings from Last 3 Encounters:  06/16/18 210 lb (95.3 kg)  06/15/18 210 lb (95.3 kg)  06/02/18 214 lb (97.1 kg)    Physical Exam Vitals signs and nursing note reviewed.  Constitutional:      General: He is not in acute distress.    Appearance: He is well-developed. He is not diaphoretic.  Neck:     Thyroid: No thyromegaly.  Musculoskeletal: Normal range of motion.        General: Swelling (2+ pitting edema bilateral) present.  Skin:    General: Skin is warm and dry.     Findings: Wound  (Superficial ulceration on right lateral ankle and cracking around both heels) present. No rash.  Neurological:     Mental Status: He is alert and oriented to person, place, and time.     Coordination: Coordination normal.    Redressed wounds in the same manner, placed gauze between the toes and then wrapped with Xeroform gauze and then roll gauze and then Ace wraps with compression.  Patient has home health set up and they will change this at least every 2 days  Assessment & Plan:   Problem List Items Addressed This Visit      Other   Peripheral edema    Other Visit Diagnoses    Stasis dermatitis of both legs    -  Primary    Patient has wound care coming out, will schedule appointment back with [place] if he needs but will hopefully schedule with wound care and home wound care.  Follow up plan: Return if symptoms worsen or fail to improve.  Counseling provided for all of the vaccine components No orders of the defined types were placed in this encounter.   Caryl Pina, MD [facility] 07/01/2018, 4:40 PM

## 2018-07-05 NOTE — Telephone Encounter (Signed)
Yes okay to go ahead and continue

## 2018-07-07 ENCOUNTER — Telehealth: Payer: Self-pay | Admitting: Cardiology

## 2018-07-07 ENCOUNTER — Ambulatory Visit (INDEPENDENT_AMBULATORY_CARE_PROVIDER_SITE_OTHER): Payer: 59 | Admitting: Gastroenterology

## 2018-07-07 ENCOUNTER — Telehealth: Payer: Self-pay | Admitting: *Deleted

## 2018-07-07 DIAGNOSIS — L03115 Cellulitis of right lower limb: Secondary | ICD-10-CM

## 2018-07-07 DIAGNOSIS — R601 Generalized edema: Secondary | ICD-10-CM

## 2018-07-07 DIAGNOSIS — D649 Anemia, unspecified: Secondary | ICD-10-CM

## 2018-07-07 DIAGNOSIS — L03116 Cellulitis of left lower limb: Secondary | ICD-10-CM

## 2018-07-07 DIAGNOSIS — I311 Chronic constrictive pericarditis: Secondary | ICD-10-CM

## 2018-07-07 DIAGNOSIS — K766 Portal hypertension: Secondary | ICD-10-CM | POA: Diagnosis not present

## 2018-07-07 DIAGNOSIS — R609 Edema, unspecified: Secondary | ICD-10-CM

## 2018-07-07 DIAGNOSIS — Z79899 Other long term (current) drug therapy: Secondary | ICD-10-CM

## 2018-07-07 MED ORDER — SULFAMETHOXAZOLE-TRIMETHOPRIM 800-160 MG PO TABS: 1.0000 | ORAL_TABLET | Freq: Two times a day (BID) | ORAL | 0 refills | Status: DC

## 2018-07-07 NOTE — Telephone Encounter (Signed)
Left message for [facility] nurse

## 2018-07-07 NOTE — Telephone Encounter (Signed)
Wife states pt's weight is 214-217 lbs and he has weeping in one leg.Pt takes torsemide 40 BID

## 2018-07-07 NOTE — Patient Instructions (Signed)
You have been scheduled for an abdominal ultrasound at [facility] (1st floor of hospital) on 07/14/2018 at 10:00am. Please arrive 15 minutes prior to your appointment for registration. Make certain not to have anything to eat or drink 6 hours prior to your appointment. Should you need to reschedule your appointment, please contact radiology at [phone]. This test typically takes about 30 minutes to perform.   Your provider has requested that you go to the basement level for lab work before leaving today. Press "B" on the elevator. The lab is located at the first door on the left as you exit the elevator.   - order will be faxed to [facility], if unable to have labs drawn with [facility], you will need to come to our office(location in basement) for your labs.     Thank you for choosing me and [facility].  Dr. Rush Landmark

## 2018-07-07 NOTE — Progress Notes (Signed)
d  [place] VISIT   Primary Care Provider Dettinger, Fransisca Kaufmann, MD [place] [address] [phone]  Patient Profile: Kent Bryant is a 68 y.o. 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  The patient presents to the [facility] for an evaluation and management of problem(s) noted below:  Problem List 1. Portal hypertension ([place])   2. Anasarca   3. Anemia, unspecified type     I connected with  [name] on 07/07/18. I verified that I was speaking with the correct person using two identifiers. Due to the COVID-19 Pandemic, this service was provided via telemedicine using Audio-Only. Interactive audio and video telecommunications were attempted between this provider and patient and his family, however failed, as patient did not have access to video capability and thus to provide timely and excellent care, we continued and completed visit with audio only. The patient was located at home. The provider was located in the office. The patient did consent to this visit and is aware of charges through their insurance as well as the limitations of evaluation and management by telemedicine. Other persons participating in this telemedicine service was his sisters. Time spent on visit was greater than 25 minutes in discussion and coordination of care.   History of Present Illness: Please see initial consultation note and progress notes for full details of HPI.  Interval History Patient has been seen by Cardiology.  The patient has deferred further therapy for possible constrictive pericarditis.  Based on documentation in the chart and per discussion with patient and with sister, still having issues with fluid in legs and concern  for abdominal distention as well as increasing scrotal edema.  He was transitioned by Cardiology to Torsemide with some effect compared to Lasix in just the last couple of weeks.  He is still having to have his legs wrapped and is followed by the wound clinic.  He was seen by his Home health RN and there is concern for possible cellulitis and the patient is going to have antibiotics called in per report.  He is drinking Ensures on a daily basis or nearly every day.  He has a good appetite per report of patient and sister.  He feels that his breathing isn't at a point that he could have any procedures however, per his report at this time.  GI Review of Systems Positive as above Negative for early satiety, jaundice, melena, hematochezia, maroon stool, change in bowel habits  Review of Systems General: Denies fevers/chills Cardiovascular: Denies chest pain/palpitations Pulmonary: Shortness of breath remains and as noted above he is concerned about this Gastroenterological: See HPI Genitourinary: Denies darkened urine Hematological: Positive for easy bruising/bleeding Dermatological: Denies jaundice Psychological: Mood is decreased Musculoskeletal: Pain throughout his legs bilaterally   Medications Current Outpatient Medications  Medication Sig Dispense Refill   atorvastatin (LIPITOR) 40 MG tablet Take 1 tablet (40 mg total) by mouth daily at 6 PM. 90 tablet 3   cholecalciferol (VITAMIN D) 1000 units tablet Take 2,000 Units by mouth daily.     ferrous sulfate 325 (65 FE) MG tablet Take 1 tablet (325 mg total) by mouth 2 (two) times daily with a meal. 865 tablet 3   folic acid (FOLVITE) 1 MG tablet Take 1 tablet (1 mg total) by mouth daily at 2 PM. 90  tablet 3   gabapentin (NEURONTIN) 300 MG capsule TAKE 2 CAPSULES (600 MG TOTAL) BY MOUTH 3 (THREE) TIMES DAILY. 540 capsule 0   KLOR-CON M20 20 MEQ tablet TAKE 4 TABLETS BY MOUTH 2 TIMES DAILY (Patient taking differently: 2 tablets BID) 240  tablet 1   lactulose (CHRONULAC) 10 GM/15ML solution Take 15 mLs (10 g total) by mouth 3 (three) times daily. 1350 mL 1   sertraline (ZOLOFT) 50 MG tablet Take 1 tablet (50 mg total) by mouth daily. 90 tablet 3   spironolactone (ALDACTONE) 25 MG tablet Take 1 tablet (25 mg total) by mouth 2 (two) times daily. 180 tablet 3   sulfamethoxazole-trimethoprim (BACTRIM DS) 800-160 MG tablet Take 1 tablet by mouth 2 (two) times daily. 20 tablet 0   torsemide (DEMADEX) 20 MG tablet TAKE 2 TABLETS TWICE DAILY 120 tablet 3   traZODone (DESYREL) 50 MG tablet TAKE 1 TO 2 TABLETS BY MOUTH AT BEDTIME AS NEEDED FOR SLEEP 180 tablet 0   triamcinolone cream (KENALOG) 0.1 %      triamcinolone ointment (KENALOG) 0.1 %      Wound Dressings (MEDIHONEY WOUND/BURN DRESSING) GEL Apply 1 application topically daily. 44 mL 1   No current facility-administered medications for this visit.     Allergies Allergies  Allergen Reactions   Doxycycline Dermatitis and Itching   Petrolatum Rash    Histories Past Medical History:  Diagnosis Date   Abnormal CXR (chest x-ray)    Agoraphobia    Anxiety    Bipolar disorder (HCC)    Chronic low back pain    Coagulopathy (HCC)    COPD (chronic obstructive pulmonary disease) (HCC)    Depression    Depression    Hemophilia (HCC)    Insomnia    Osteopenia    Peripheral edema    Peripheral neuropathy    Peripheral neuropathy    Vitamin D deficiency    Past Surgical History:  Procedure Laterality Date   CATARACT EXTRACTION  05/29/2017   CATARACT EXTRACTION W/PHACO Left 06/13/2017   Procedure: CATARACT EXTRACTION PHACO AND INTRAOCULAR LENS PLACEMENT LEFT EYE;  Surgeon: Baruch Goldmann, MD;  Location: [facility];  Service: Ophthalmology;  Laterality: Left;  CDE: 23.32   Social History   Socioeconomic History   Marital status: Divorced    Spouse name: Not on file   Number of children: 2   Years of education: Not on file   Highest education  level: 10th grade  Occupational History   Occupation: Disabled  Scientist, product/process development strain: Not hard at all   Food insecurity    Worry: Never true    Inability: Never true   Transportation needs    Medical: No    Non-medical: No  Tobacco Use   Smoking status: Current Every Day Smoker    Packs/day: 0.50    Years: 15.00    Pack years: 7.50    Types: Cigarettes    Last attempt to quit: 07/07/2017    Years since quitting: 1.0   Smokeless tobacco: Never Used  Substance and Sexual Activity   Alcohol use: No   Drug use: No   Sexual activity: Not Currently    Birth control/protection: None  Lifestyle   Physical activity    Days per week: 0 days    Minutes per session: 0 min   Stress: Only a little  Relationships   Social connections    Talks on phone: More than three times a week  Gets together: Never    Attends religious service: Never    Active member of club or organization: No    Attends meetings of clubs or organizations: Never    Relationship status: Divorced   Intimate partner violence    Fear of current or ex partner: No    Emotionally abused: No    Physically abused: No    Forced sexual activity: No  Other Topics Concern   Not on file  Social History Narrative   Not on file   Family History  Problem Relation Age of Onset   Stroke Mother    Colon cancer Neg Hx    Esophageal cancer Neg Hx    Liver disease Neg Hx    Pancreatic cancer Neg Hx    Stomach cancer Neg Hx    Inflammatory bowel disease Neg Hx    I have reviewed his medical, social, and family history in detail and updated the electronic medical record as necessary.    PHYSICAL EXAMINATION  Telemedicine Visit   REVIEW OF DATA  I reviewed the following data at the time of this encounter:  GI Procedures and Studies  None to review  Laboratory Studies  Reviewed in Epic  Imaging Studies  No new imaging since initial clinic visit   ASSESSMENT  Mr. Goodley  is a 68 y.o. 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  The patient is seen today for evaluation and management of:  1. Portal hypertension ([place])   2. Anasarca   3. Anemia, unspecified type    The patient has evidence of portal hypertension on previous imaging.  I suspect that he does have a component of baseline liver disease but also in the setting of his significant cardiac disease.  He diuretics have been recently titrated by cardiology and since they are working on that currently I would like them to have an opportunity to adjust her torsemide as necessary.  I think he would be able to benefit from potentially increase spironolactone dosing as well.  We will follow-up blood counts and check his iron indices to evaluate for iron deficiency and if he needs IV iron will give that to him.  He will need an ultrasound of his abdomen to evaluate for any ascites.  Previously his CT scan showed significant abdominal wall swelling but no significant amount of ascites that could be tapped after my discussion with radiology previously.  If there is significant increase in ascites then we should tap that more for the understanding of whether the patient may require SBP prophylaxis in the future.  I do not think he can tolerate an endoscopy for variceal screening based on what is being described by his sister but we may need to consider this in the near future.  He will need a hepatitis A and hepatitis B vaccine series to be initiated.  Follow-up will be dictated after a few weeks time.  He is reportedly getting antibiotics by his primary care provider after his home health RN reaches out to his primary care provider.  He will continue his wound care as able as there has been some benefit to the patient per  the patient's sister.  He will continue taking protein supplements in order to optimize his protein state.  We spoke together about the role of minimal salt intake less than 1500  mg/day and they will work on trying to do that however, the patient and his diet is not always able to know exactly how much salt is in his foods.  That is something that the patient's sisters will work with him about.  We will follow him up in a few weeks time.  All patient questions were answered, to the best of my ability, and the patient agrees to the aforementioned plan of action with follow-up as indicated.   PLAN  Laboratories as outlined below US Abdomen to evaluate for ascites and if found will proceed with tapping and to screen for [facility] Will need HAV/HBV Vaccine series at some point in future Follow up Anemia studies and give IV Iron if indicated Hold on EGD for now based on clinical status Patient to have his diuretics titrated by Cardiology since just recently transitioned to Torsemide - may benefit from increased Spironolactone dosing   Orders Placed This Encounter  Procedures   US Abdomen Complete   CBC   Comp Met (CMET)   Magnesium   Alkaline phosphatase   IBC + Ferritin   INR/PT   Hepatitis B Core Antibody, total    New Prescriptions   No medications on file   Modified Medications   Modified Medication Previous Medication   SULFAMETHOXAZOLE-TRIMETHOPRIM (BACTRIM DS) 800-160 MG TABLET sulfamethoxazole-trimethoprim (BACTRIM DS) 800-160 MG tablet      Take 1 tablet by mouth 2 (two) times daily.    Take 1 tablet by mouth 2 (two) times daily.    Planned Follow Up: No follow-ups on file.   Justice Britain, MD [facility] Office # [phone]

## 2018-07-07 NOTE — Telephone Encounter (Signed)
Pt notified of RX 

## 2018-07-07 NOTE — Telephone Encounter (Signed)
Kent Bryant called (RN seeing pt -[facility])  She request a verbal order to change dressings to UNA boots   - she also thinks that the pt should be on a antibiotic for leg wounds - there is a ODOR.

## 2018-07-07 NOTE — Telephone Encounter (Signed)
Bactrim Prescription sent to pharmacy. Please give verbal order to change dressing to UNA boots

## 2018-07-07 NOTE — Telephone Encounter (Signed)
Pt is having swelling all over and they're wanting to have his torsemide (DEMADEX) 20 MG tablet [[id number]]  Increased.

## 2018-07-08 ENCOUNTER — Encounter: Payer: Self-pay | Admitting: Gastroenterology

## 2018-07-08 DIAGNOSIS — D649 Anemia, unspecified: Secondary | ICD-10-CM | POA: Insufficient documentation

## 2018-07-08 NOTE — Telephone Encounter (Signed)
His weight is actually lower than our last visit but if significant swelling can increase torsemide to 60mg  bid. Update [place] on Monday on weights and swelling   J Kyrsten Deleeuw MD

## 2018-07-08 NOTE — Telephone Encounter (Signed)
Called pt's sister Inez Catalina. No answer, left message for her to return call.

## 2018-07-08 NOTE — Telephone Encounter (Signed)
Left message with [facility] nurse, with details,  to please call our office to confirm receiving .

## 2018-07-08 NOTE — Telephone Encounter (Signed)
Pt's sister notified. She voiced understanding. She will call on Monday with update on weights.

## 2018-07-08 NOTE — Telephone Encounter (Signed)
That definitely is something that they can try, just please let them know that we did Unna boots for 4 or 5 weeks and the progression did not go well which is why I switched to the current dressing changes.  My recommendation is that if they are going to do Unna boots to not leave it on for a week and change it and either like 3 or 4 days.  Let me know what you think about this.

## 2018-07-08 NOTE — Telephone Encounter (Signed)
Please give Nicholes Rough, pt's sister a call -- has not heard anything concerning the pt's Torsemide

## 2018-07-09 ENCOUNTER — Telehealth (INDEPENDENT_AMBULATORY_CARE_PROVIDER_SITE_OTHER): Payer: 59 | Admitting: Student

## 2018-07-09 ENCOUNTER — Encounter: Payer: Self-pay | Admitting: Student

## 2018-07-09 ENCOUNTER — Telehealth: Payer: Self-pay | Admitting: Gastroenterology

## 2018-07-09 ENCOUNTER — Other Ambulatory Visit: Payer: Self-pay

## 2018-07-09 ENCOUNTER — Other Ambulatory Visit: Payer: 59

## 2018-07-09 VITALS — BP 102/60 | HR 80 | Temp 98.6°F | Wt 211.0 lb

## 2018-07-09 DIAGNOSIS — I10 Essential (primary) hypertension: Secondary | ICD-10-CM | POA: Diagnosis not present

## 2018-07-09 DIAGNOSIS — R7989 Other specified abnormal findings of blood chemistry: Secondary | ICD-10-CM

## 2018-07-09 DIAGNOSIS — D649 Anemia, unspecified: Secondary | ICD-10-CM

## 2018-07-09 DIAGNOSIS — K766 Portal hypertension: Secondary | ICD-10-CM | POA: Diagnosis not present

## 2018-07-09 DIAGNOSIS — R945 Abnormal results of liver function studies: Secondary | ICD-10-CM

## 2018-07-09 DIAGNOSIS — R600 Localized edema: Secondary | ICD-10-CM

## 2018-07-09 DIAGNOSIS — I311 Chronic constrictive pericarditis: Secondary | ICD-10-CM | POA: Diagnosis not present

## 2018-07-09 LAB — CBC WITH DIFFERENTIAL/PLATELET
Basophils Absolute: 0.1 10*3/uL (ref 0.0–0.2)
Basos: 1 %
EOS (ABSOLUTE): 0.2 10*3/uL (ref 0.0–0.4)
Eos: 2 %
Hematocrit: 35.3 % — ABNORMAL LOW (ref 37.5–51.0)
Hemoglobin: 12.3 g/dL — ABNORMAL LOW (ref 13.0–17.7)
Immature Grans (Abs): 0.1 10*3/uL (ref 0.0–0.1)
Immature Granulocytes: 1 %
Lymphocytes Absolute: 1.1 10*3/uL (ref 0.7–3.1)
Lymphs: 12 %
MCH: 31.5 pg (ref 26.6–33.0)
MCHC: 34.8 g/dL (ref 31.5–35.7)
MCV: 90 fL (ref 79–97)
Monocytes Absolute: 1.2 10*3/uL — ABNORMAL HIGH (ref 0.1–0.9)
Monocytes: 13 %
Neutrophils Absolute: 6.7 10*3/uL (ref 1.4–7.0)
Neutrophils: 71 %
Platelets: 141 10*3/uL — ABNORMAL LOW (ref 150–450)
RBC: 3.91 x10E6/uL — ABNORMAL LOW (ref 4.14–5.80)
RDW: 15.2 % (ref 11.6–15.4)
WBC: 9.4 10*3/uL (ref 3.4–10.8)

## 2018-07-09 LAB — COMPREHENSIVE METABOLIC PANEL
ALT: 29 IU/L (ref 0–44)
AST: 41 IU/L — ABNORMAL HIGH (ref 0–40)
Albumin/Globulin Ratio: 0.6 — ABNORMAL LOW (ref 1.2–2.2)
Albumin: 2.6 g/dL — ABNORMAL LOW (ref 3.8–4.8)
Alkaline Phosphatase: 159 IU/L — ABNORMAL HIGH (ref 39–117)
BUN/Creatinine Ratio: 16 (ref 10–24)
BUN: 23 mg/dL (ref 8–27)
Bilirubin Total: 1.4 mg/dL — ABNORMAL HIGH (ref 0.0–1.2)
CO2: 26 mmol/L (ref 20–29)
Calcium: 8.6 mg/dL (ref 8.6–10.2)
Chloride: 92 mmol/L — ABNORMAL LOW (ref 96–106)
Creatinine, Ser: 1.4 mg/dL — ABNORMAL HIGH (ref 0.76–1.27)
GFR calc Af Amer: 60 mL/min/{1.73_m2} (ref >59)
GFR calc non Af Amer: 52 mL/min/{1.73_m2} — ABNORMAL LOW (ref >59)
Globulin, Total: 4.1 g/dL (ref 1.5–4.5)
Glucose: 65 mg/dL (ref 65–99)
Potassium: 4.7 mmol/L (ref 3.5–5.2)
Sodium: 133 mmol/L — ABNORMAL LOW (ref 134–144)
Total Protein: 6.7 g/dL (ref 6.0–8.5)

## 2018-07-09 LAB — ALKALINE PHOSPHATASE: Alkaline Phosphatase: 159 IU/L — ABNORMAL HIGH (ref 39–117)

## 2018-07-09 NOTE — Telephone Encounter (Signed)
Lab orders changed to [place] as requested

## 2018-07-09 NOTE — Patient Instructions (Signed)
Medication Instructions:  Your physician recommends that you continue on your current medications as directed. Please refer to the Current Medication list given to you today.  Take Torsemide 60 mg Two Times Daily   If you need a refill on your cardiac medications before your next appointment, please call your pharmacy.   Lab work: Your physician recommends that you return for lab work to be done by [facility]   If you have labs (blood work) drawn today and your tests are completely normal, you will receive your results only by: Marland Kitchen MyChart Message (if you have MyChart) OR . A paper copy in the mail If you have any lab test that is abnormal or we need to change your treatment, we will call you to review the results.  Testing/Procedures: NONE   Follow-Up: At [facility] you and your health needs are our priority.  As part of our continuing mission to provide you with exceptional heart care, we have created designated Provider Care Teams.  These Care Teams include your primary Cardiologist (physician) and Advanced Practice Providers (APPs -  Physician Assistants and Nurse Practitioners) who all work together to provide you with the care you need, when you need it. You will need a follow up appointment in 2 months.  Please call our office 2 months in advance to schedule this appointment.  You may see Carlyle Dolly, MD or one of the following Advanced Practice Providers on your designated Care Team:   Bernerd Pho, PA-C [facility]) . Ermalinda Barrios, PA-C ([place])  Any Other Special Instructions Will Be Listed Below (If Applicable). Thank you for choosing [facility]!

## 2018-07-09 NOTE — Progress Notes (Signed)
Virtual Visit via Telephone Note   This visit type was conducted due to national recommendations for restrictions regarding the COVID-19 Pandemic (e.g. social distancing) in an effort to limit this patient's exposure and mitigate transmission in our community.  Due to his co-morbid illnesses, this patient is at least at moderate risk for complications without adequate follow up.  This format is felt to be most appropriate for this patient at this time.  The patient did not have access to video technology/had technical difficulties with video requiring transitioning to audio format only (telephone).  All issues noted in this document were discussed and addressed.  No physical exam could be performed with this format.  Please refer to the patient's chart for his  consent to telehealth for [facility]   Date:  07/09/2018   ID:  [name], DOB 09-27-1950, MRN [id number]  Patient Location: Home Provider Location: Office  PCP:  Dettinger, Fransisca Kaufmann, MD  Cardiologist:  Carlyle Dolly, MD  Electrophysiologist:  None   Evaluation Performed:  Follow-Up Visit  Chief Complaint: Edema and Weight Gain  History of Present Illness:    Kent Bryant is a 68 y.o. male with past medical history of constrictive pericarditis (followed by [facility], not felt to be candidate for pericardiectomy by CT Surgery due to comorbidities), portal HTN, HTN, and HLD who presents for a 3-week follow-up telehealth visit.   He most recently had a phone visit with Ermalinda Barrios, PA-C on 06/15/2018 and reported having lost 11 lbs since being switched from Lasix to Torsemide 40mg  BID. Was also being treated for cellulitis at that time and was on Doxycycline. The patient was reluctant to follow-up with the CHF clinic at that time. Has followed up with his PCP in the interim and was referred to the Wound Clinic.   He called the office on 07/07/2018 reporting worsening edema and weight gain, now at 214 - 217lbs. Was recommended to  increase Torsemide from 40mg  BID to 60mg  BID and call with updated weights.   In talking with the patient and his sister today, he reports worsening lower extremity edema extending into his scrotum for the past few weeks. Has been evaluated by his PCP and Unna boots were ordered and he is being followed by the wound clinic for an open sore along his calf. He denies any associated dyspnea on exertion, PND, chest pain, or palpitations. Does have 2-pillow orthopnea.   He just started the higher dose of Torsemide yesterday evening and reports he has experienced more frequent urination. Weight has already declined back to 211 lbs. Unsure of his baseline weight but this was 200 lbs in 12/2017.  The patient does not have symptoms concerning for COVID-19 infection (fever, chills, cough, or new shortness of breath).    Past Medical History:  Diagnosis Date   Abnormal CXR (chest x-ray)    Agoraphobia    Anxiety    Bipolar disorder (HCC)    Chronic low back pain    Coagulopathy (HCC)    COPD (chronic obstructive pulmonary disease) (HCC)    Depression    Depression    Hemophilia (HCC)    Insomnia    Osteopenia    Peripheral edema    Peripheral neuropathy    Peripheral neuropathy    Vitamin D deficiency    Past Surgical History:  Procedure Laterality Date   CATARACT EXTRACTION  05/29/2017   CATARACT EXTRACTION W/PHACO Left 06/13/2017   Procedure: CATARACT EXTRACTION PHACO AND INTRAOCULAR LENS PLACEMENT LEFT EYE;  Surgeon: Baruch Goldmann, MD;  Location: [facility];  Service: Ophthalmology;  Laterality: Left;  CDE: 23.32     Current Meds  Medication Sig   atorvastatin (LIPITOR) 40 MG tablet Take 1 tablet (40 mg total) by mouth daily at 6 PM.   cholecalciferol (VITAMIN D) 1000 units tablet Take 2,000 Units by mouth daily.   ferrous sulfate 325 (65 FE) MG tablet Take 1 tablet (325 mg total) by mouth 2 (two) times daily with a meal.   folic acid (FOLVITE) 1 MG tablet Take 1  tablet (1 mg total) by mouth daily at 2 PM.   gabapentin (NEURONTIN) 300 MG capsule TAKE 2 CAPSULES (600 MG TOTAL) BY MOUTH 3 (THREE) TIMES DAILY.   KLOR-CON M20 20 MEQ tablet TAKE 4 TABLETS BY MOUTH 2 TIMES DAILY (Patient taking differently: 2 tablets BID)   lactulose (CHRONULAC) 10 GM/15ML solution Take 15 mLs (10 g total) by mouth 3 (three) times daily.   sertraline (ZOLOFT) 50 MG tablet Take 1 tablet (50 mg total) by mouth daily.   spironolactone (ALDACTONE) 25 MG tablet Take 1 tablet (25 mg total) by mouth 2 (two) times daily.   torsemide (DEMADEX) 20 MG tablet TAKE 2 TABLETS TWICE DAILY (Patient taking differently: 60 mg 2 (two) times daily. TAKE 2 TABLETS TWICE DAILY)   traZODone (DESYREL) 50 MG tablet TAKE 1 TO 2 TABLETS BY MOUTH AT BEDTIME AS NEEDED FOR SLEEP   triamcinolone cream (KENALOG) 0.1 %    triamcinolone ointment (KENALOG) 0.1 %    Wound Dressings (MEDIHONEY WOUND/BURN DRESSING) GEL Apply 1 application topically daily.     Allergies:   Doxycycline and Petrolatum   Social History   Tobacco Use   Smoking status: Current Every Day Smoker    Packs/day: 0.10    Years: 15.00    Pack years: 1.50    Types: Cigarettes    Last attempt to quit: 07/07/2017    Years since quitting: 1.0   Smokeless tobacco: Never Used  Substance Use Topics   Alcohol use: No   Drug use: No     Family Hx: The patient's family history includes Stroke in his mother. There is no history of Colon cancer, Esophageal cancer, Liver disease, Pancreatic cancer, Stomach cancer, or Inflammatory bowel disease.  ROS:   Please see the history of present illness.     All other systems reviewed and are negative.   Prior CV studies:   The following studies were reviewed today:  Limited Echo: 07/2017 Study Conclusions  - Limited echo to evaluate further for constrictive physiology. - Left ventricle: Systolic function was normal. The estimated   ejection fraction was in the range of 60% to  65%. There is a   septal bounce present. - There is 25% respiratory variation across the MV and 30% across   the TV. Medial e&' 16, lateral e&' 10, E/A ratio 3.2 and decel time   150. IVC is dilated and fixed. There is respiratory variation   across the hepatic vein. - Findings consistent with constrictive physiology.  Labs/Other Tests and Data Reviewed:    EKG:  No ECG reviewed.  Recent Labs: 07/22/2017: B Natriuretic Peptide 326.0 09/19/2017: Pro B Natriuretic peptide (BNP) 312.0; TSH 2.20 12/16/2017: Magnesium 1.9 06/10/2018: ALT 21; Hemoglobin 12.4; Platelets 146 06/17/2018: BUN 19; Creatinine, Ser 1.10; Potassium 3.9; Sodium 132   Recent Lipid Panel Lab Results  Component Value Date/Time   CHOL 104 01/21/2018 04:32 PM   TRIG 47 01/21/2018 04:32 PM  TRIG 131 06/08/2014 10:24 AM   HDL 38 (L) 01/21/2018 04:32 PM   HDL 27 (L) 06/08/2014 10:24 AM   CHOLHDL 2.7 01/21/2018 04:32 PM   LDLCALC 57 01/21/2018 04:32 PM   LDLCALC 90 10/21/2013 03:54 PM    Wt Readings from Last 3 Encounters:  07/09/18 211 lb (95.7 kg)  06/16/18 210 lb (95.3 kg)  06/15/18 210 lb (95.3 kg)     Objective:    Vital Signs:  BP 102/60    Pulse 80    Temp 98.6 F (37 C)    Wt 211 lb (95.7 kg)    SpO2 96%    BMI 32.08 kg/m    General: Pleasant male sounding in NAD Psych: Normal affect. Neuro: Alert and oriented X 3.  Lungs:  Resp regular and unlabored while talking on the phone.   ASSESSMENT & PLAN:    1. Bilateral Lower Extremity Edema - he was previously experiencing worsening edema despite titration of Lasix and was transitioned to Torsemide 40mg  BID in 05/2018 with titration to 60mg  BID on 07/07/2018 in the setting of weight gain and edema. Since adjusting the dose, his weight has already declined 6 lbs from 217 lbs to 211 lbs. Unknown baseline but was 200 lbs in 12/2017. I have asked them to continue to follow daily weights and report back with his numbers next Monday or Tuesday as originally  instructed. He just had repeat labs with his PCP today but results are pending at this time. Will request a repeat BMET in 2 weeks to be obtained by [facility] for reassessment of renal function and electrolytes following recent dose adjustment.  - sodium and fluid restriction reviewed with the patient and his sister.   2. Constrictive Pericarditis - followed by [facility] but not felt to be candidate for pericardiectomy by CT Surgery due to comorbidities. Reviewed with the patient again today and he reports feeling uncomfortable with the CT Surgeon at [facility]. Offered referral for second opinion but he declines at this time.   3. Portal HTN - he has established with Dr. Rush Landmark. Scheduled for a repeat Abdominal US next week.   4. HTN - BP has been well-controlled when checked at home, at 102/60 on most recent check. Remains on Spironolactone 25mg  daily and Torsemide as outlined above.     COVID-19 Education: The signs and symptoms of COVID-19 were discussed with the patient and how to seek care for testing (follow up with PCP or arrange E-visit).  The importance of social distancing was discussed today.  Time:   Today, I have spent 22 minutes with the patient with telehealth technology discussing the above problems.     Medication Adjustments/Labs and Tests Ordered: Current medicines are reviewed at length with the patient today.  Concerns regarding medicines are outlined above.   Tests Ordered: No orders of the defined types were placed in this encounter.   Medication Changes: No orders of the defined types were placed in this encounter.   Follow Up: Call with weights next week. Follow-up in 8 weeks.   Signed, Erma Heritage, PA-C  07/09/2018 4:40 PM    [facility]

## 2018-07-10 LAB — PROTIME-INR
INR: 1.2 (ref 0.8–1.2)
Prothrombin Time: 12.2 s — ABNORMAL HIGH (ref 9.1–12.0)

## 2018-07-10 LAB — FERRITIN: Ferritin: 475 ng/mL — ABNORMAL HIGH (ref 30–400)

## 2018-07-10 LAB — HEPATITIS B CORE ANTIBODY, TOTAL: Hep B Core Total Ab: NEGATIVE

## 2018-07-10 LAB — MAGNESIUM: Magnesium: 2 mg/dL (ref 1.6–2.3)

## 2018-07-13 ENCOUNTER — Telehealth: Payer: Self-pay | Admitting: Family Medicine

## 2018-07-13 NOTE — Telephone Encounter (Signed)
Verbal order given to [employer] from [facility].  Sister Inez Catalina aware

## 2018-07-13 NOTE — Telephone Encounter (Signed)
Patient needs an order for chemistry panel to be done every 2 weeks, starting next week.  Patient does not need an INR every check because he is not currently as far as INR on warfarin.

## 2018-07-13 NOTE — Telephone Encounter (Signed)
Patient's sister (betty) states that patient was to have his kidney function and INR checked by home health nurse. No orders have been given to home health nurse.  Advise sister that labs were checked last week and INR and kidney function were both checked.

## 2018-07-14 ENCOUNTER — Ambulatory Visit (HOSPITAL_COMMUNITY): Payer: 59

## 2018-07-14 NOTE — Telephone Encounter (Signed)
[id number]-cc 

## 2018-07-14 NOTE — Telephone Encounter (Signed)
07/09/2018 211 lbs,   7/3, 211 lbs    7/4 208 lbs, 7/5- 207 lbs  7/6 207 lbs  today 206 lbs  Groin not as swollen  she needs to know how much medication to still give him

## 2018-07-14 NOTE — Telephone Encounter (Signed)
Left voicemail to give weights from the weekend.  [phone]

## 2018-07-15 ENCOUNTER — Ambulatory Visit: Payer: 59 | Admitting: Student

## 2018-07-15 MED ORDER — TORSEMIDE 20 MG PO TABS: ORAL_TABLET | ORAL | 3 refills | Status: DC

## 2018-07-15 NOTE — Telephone Encounter (Signed)
Weights contniue to improve. His last labs showed some stress on the kidneys. Would change to torsemide 60mg  in AM and 40mg  in PM. Repaet BMET/Mg in 2 weeks, I believe he has home health that can draw labs   J Latravia Southgate MD

## 2018-07-15 NOTE — Telephone Encounter (Signed)
[id number] 

## 2018-07-15 NOTE — Telephone Encounter (Signed)
Spoke with  sister, new changes for torsemide explained, mailed lab slip

## 2018-07-15 NOTE — Telephone Encounter (Signed)
Pt's sister returned call

## 2018-07-17 ENCOUNTER — Telehealth: Payer: Self-pay | Admitting: Family Medicine

## 2018-07-17 ENCOUNTER — Telehealth: Payer: Self-pay | Admitting: Student

## 2018-07-17 IMAGING — US US ABDOMEN LIMITED
1 series · 14 of 25 positions shown · non-contrast
Comparison: None

CLINICAL DATA: Elevated LFTs

EXAM:
ULTRASOUND ABDOMEN LIMITED RIGHT UPPER QUADRANT

[Series 1: us abdomen limited · 0.18mm/px · 14 of 64 slices shown]
[im 1/64]
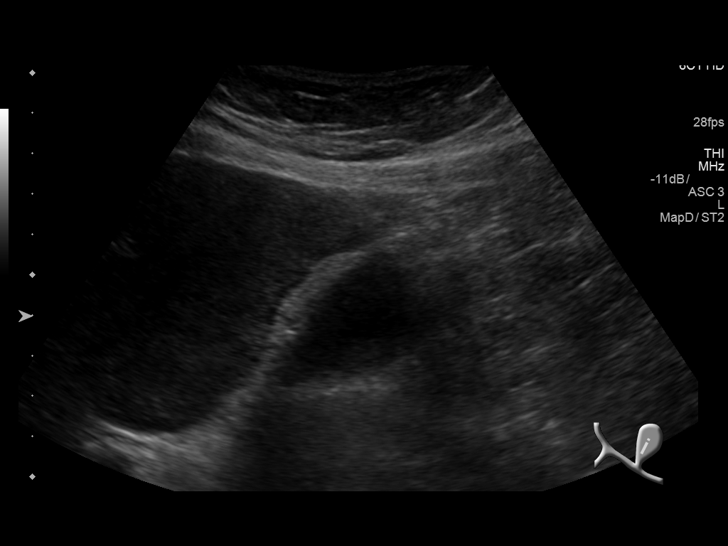
[im 6/64]
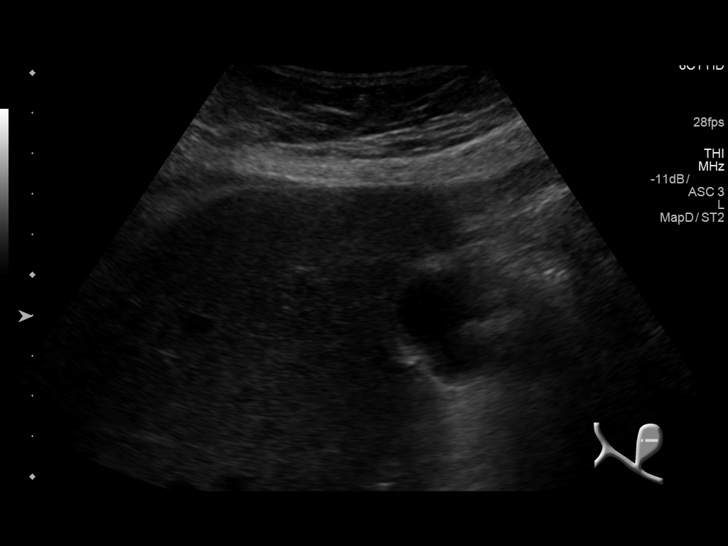
[im 11/64]
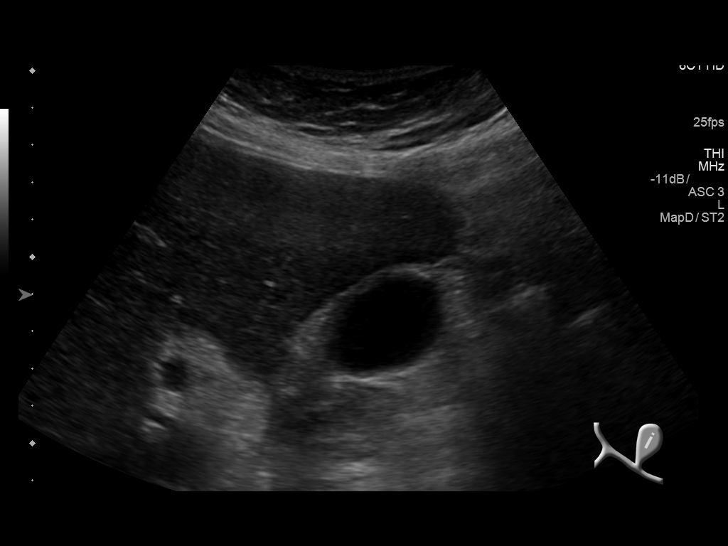
[im 16/64]
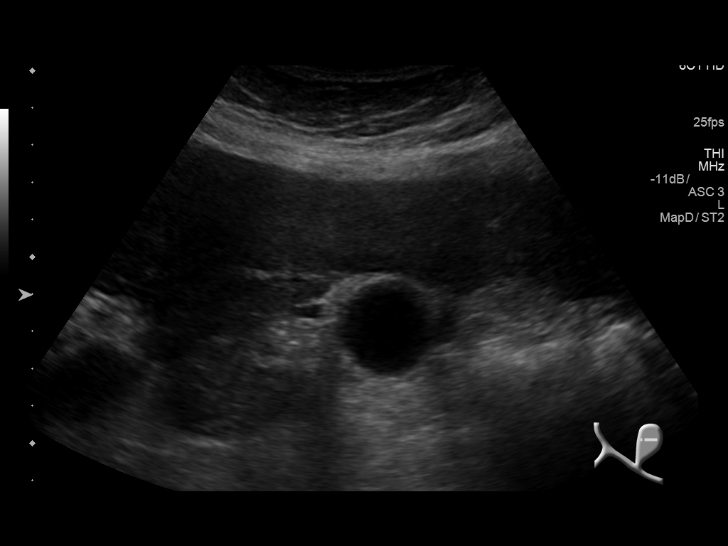
[im 22/64]
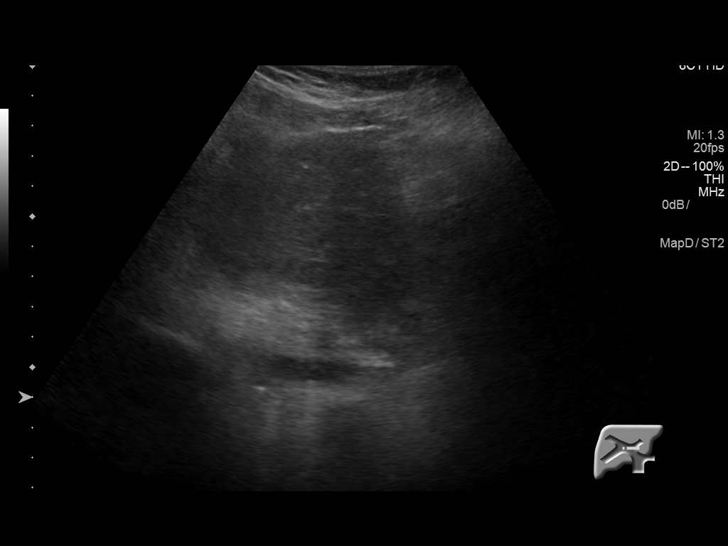
[im 24/64]
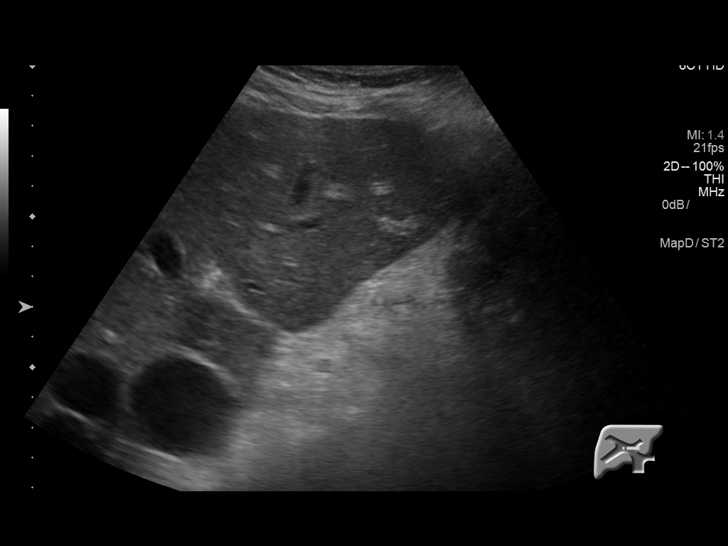
[im 29/64]
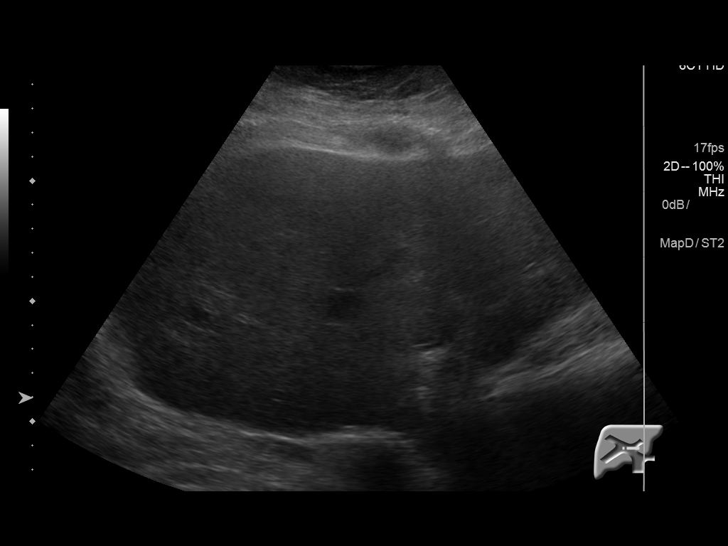
[im 35/64]
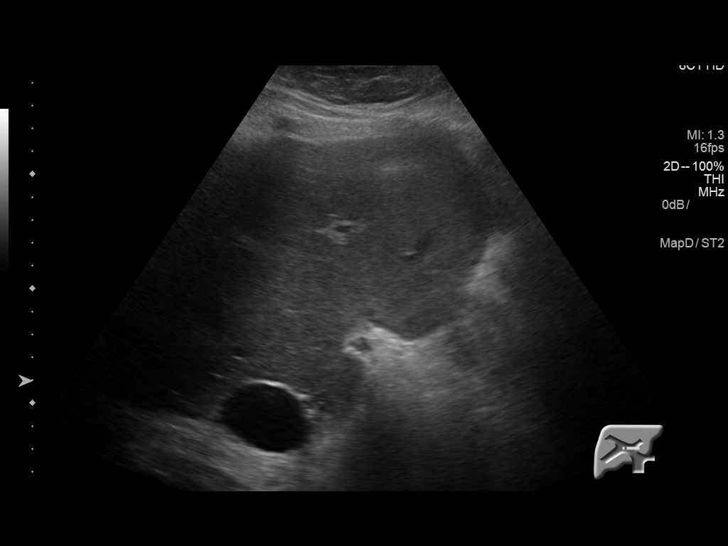
[im 40/64]
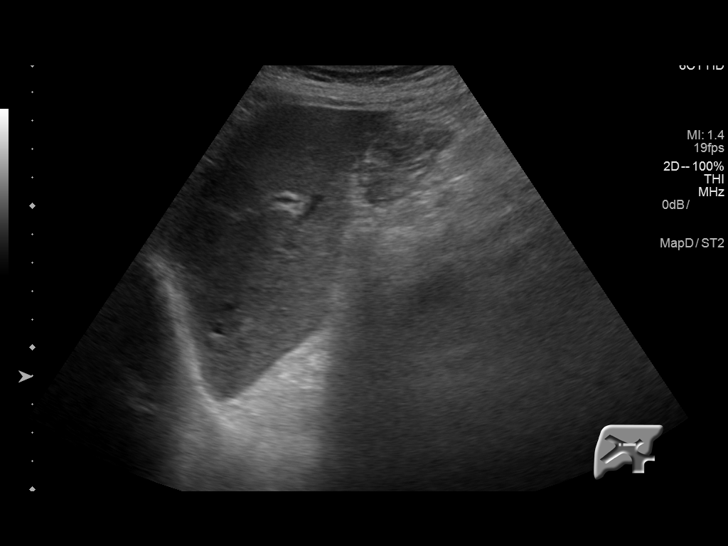
[im 43/64]
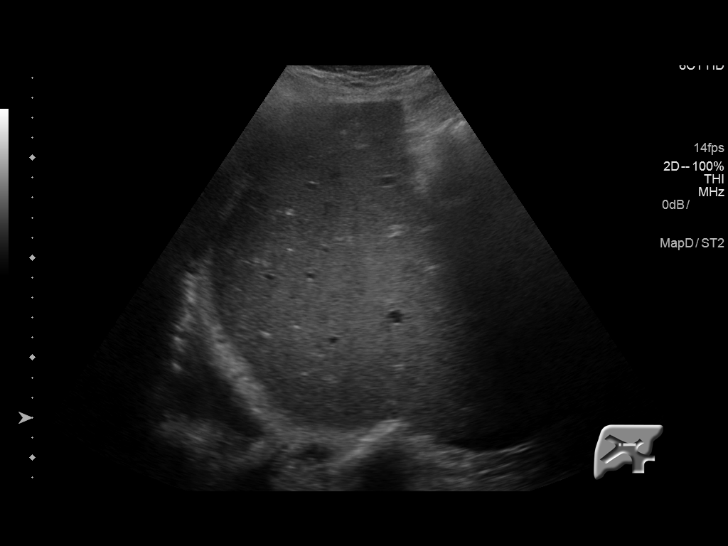
[im 48/64]
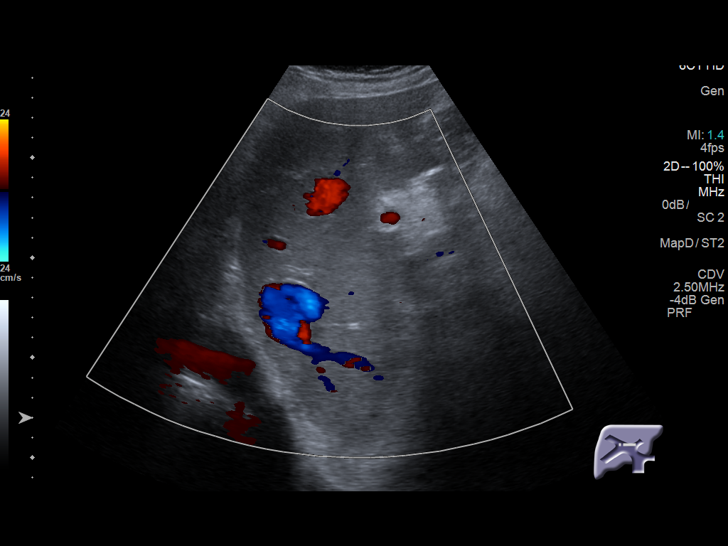
[im 53/64]
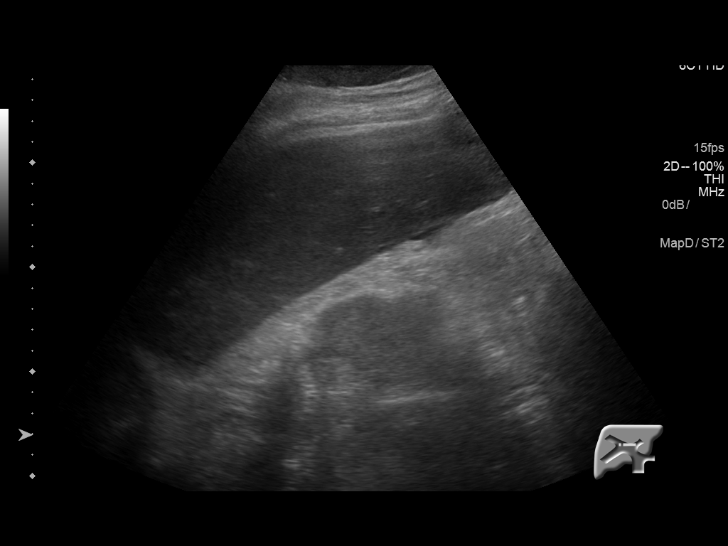
[im 58/64]
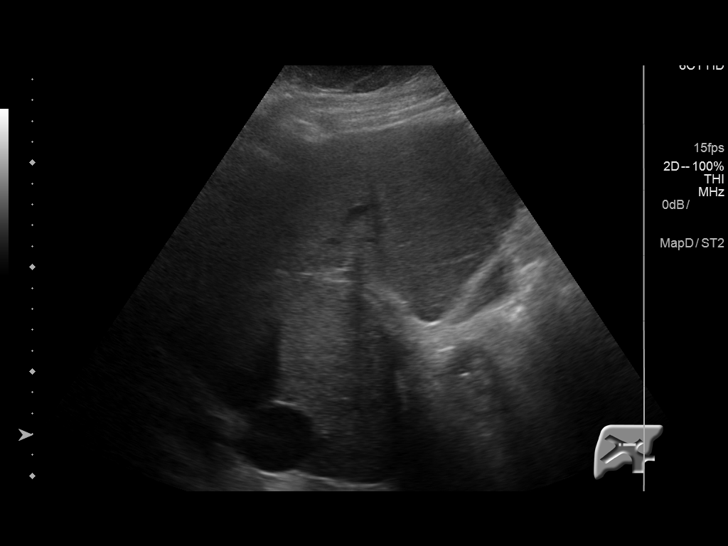
[im 64/64]
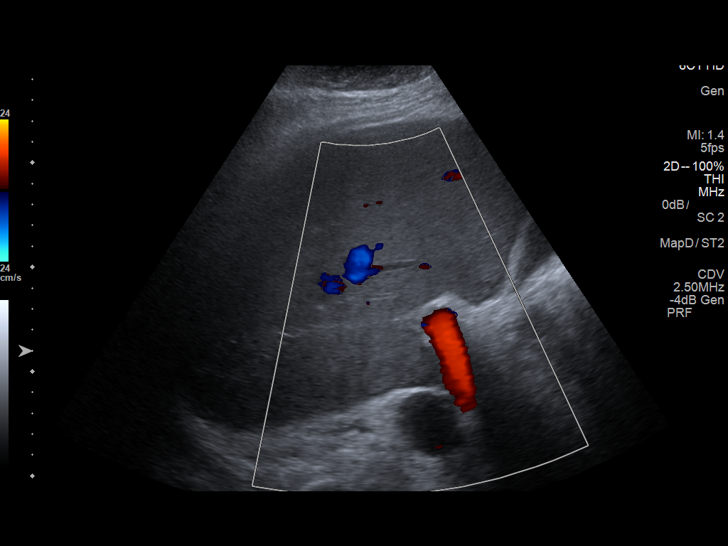

[14 of 25 positions shown; findings below may reference images not displayed]

FINDINGS: Gallbladder:

Mild gallbladder wall thickening, nonspecific, may be related to
ascites or incomplete distention. No shadowing calculi,
pericholecystic fluid or sonographic Murphy sign.

Common bile duct:

Diameter: 3 mm diameter, normal

Liver:

Normal echogenicity. No definite focal mass lesion. Tiny margins
appear questionably irregular. Portal vein is patent on color
Doppler imaging with normal direction of blood flow towards the
liver.

Small amount of ascites.

Small RIGHT pleural effusion noted.
IMPRESSION: Small amount of ascites.

Small RIGHT pleural effusion.

Question subtle irregularity/nodularity of hepatic contours, without
discrete hepatic mass lesion seen.

Cannot completely exclude cirrhosis; consider followup CT imaging
with contrast for further evaluation.

## 2018-07-17 NOTE — Telephone Encounter (Signed)
Aware,   do as wound center advises.

## 2018-07-17 NOTE — Telephone Encounter (Signed)
Okay go ahead and continue with what they are doing at the wound center.

## 2018-07-17 NOTE — Telephone Encounter (Signed)
Please call patient's sister regarding medication questions. / tg

## 2018-07-20 ENCOUNTER — Emergency Department (HOSPITAL_COMMUNITY): Payer: 59

## 2018-07-20 ENCOUNTER — Other Ambulatory Visit: Payer: Self-pay

## 2018-07-20 ENCOUNTER — Inpatient Hospital Stay (HOSPITAL_COMMUNITY)
Admission: EM | Admit: 2018-07-20 | Discharge: 2018-07-22 | DRG: 291 | Disposition: A | Discharge status: Home Health | Payer: 59 | Attending: Internal Medicine | Admitting: Internal Medicine

## 2018-07-20 DIAGNOSIS — Z823 Family history of stroke: Secondary | ICD-10-CM

## 2018-07-20 DIAGNOSIS — R188 Other ascites: Secondary | ICD-10-CM

## 2018-07-20 DIAGNOSIS — R531 Weakness: Secondary | ICD-10-CM

## 2018-07-20 DIAGNOSIS — W06XXXA Fall from bed, initial encounter: Secondary | ICD-10-CM | POA: Diagnosis present

## 2018-07-20 DIAGNOSIS — R0602 Shortness of breath: Secondary | ICD-10-CM

## 2018-07-20 DIAGNOSIS — M25571 Pain in right ankle and joints of right foot: Secondary | ICD-10-CM | POA: Diagnosis present

## 2018-07-20 DIAGNOSIS — J449 Chronic obstructive pulmonary disease, unspecified: Secondary | ICD-10-CM | POA: Diagnosis present

## 2018-07-20 DIAGNOSIS — I110 Hypertensive heart disease with heart failure: Principal | ICD-10-CM | POA: Diagnosis present

## 2018-07-20 DIAGNOSIS — F319 Bipolar disorder, unspecified: Secondary | ICD-10-CM | POA: Diagnosis present

## 2018-07-20 DIAGNOSIS — I311 Chronic constrictive pericarditis: Secondary | ICD-10-CM | POA: Diagnosis present

## 2018-07-20 DIAGNOSIS — D66 Hereditary factor VIII deficiency: Secondary | ICD-10-CM | POA: Diagnosis present

## 2018-07-20 DIAGNOSIS — S022XXA Fracture of nasal bones, initial encounter for closed fracture: Secondary | ICD-10-CM | POA: Diagnosis present

## 2018-07-20 DIAGNOSIS — D649 Anemia, unspecified: Secondary | ICD-10-CM | POA: Diagnosis present

## 2018-07-20 DIAGNOSIS — I509 Heart failure, unspecified: Secondary | ICD-10-CM

## 2018-07-20 DIAGNOSIS — K7460 Unspecified cirrhosis of liver: Secondary | ICD-10-CM | POA: Diagnosis present

## 2018-07-20 DIAGNOSIS — Y92003 Bedroom of unspecified non-institutional (private) residence as the place of occurrence of the external cause: Secondary | ICD-10-CM

## 2018-07-20 DIAGNOSIS — Z23 Encounter for immunization: Secondary | ICD-10-CM

## 2018-07-20 DIAGNOSIS — E785 Hyperlipidemia, unspecified: Secondary | ICD-10-CM | POA: Diagnosis present

## 2018-07-20 DIAGNOSIS — I5030 Unspecified diastolic (congestive) heart failure: Secondary | ICD-10-CM | POA: Diagnosis present

## 2018-07-20 DIAGNOSIS — E869 Volume depletion, unspecified: Secondary | ICD-10-CM | POA: Diagnosis present

## 2018-07-20 DIAGNOSIS — R0902 Hypoxemia: Secondary | ICD-10-CM | POA: Diagnosis not present

## 2018-07-20 DIAGNOSIS — L89312 Pressure ulcer of right buttock, stage 2: Secondary | ICD-10-CM | POA: Diagnosis present

## 2018-07-20 DIAGNOSIS — R609 Edema, unspecified: Secondary | ICD-10-CM | POA: Diagnosis present

## 2018-07-20 DIAGNOSIS — R601 Generalized edema: Secondary | ICD-10-CM | POA: Diagnosis present

## 2018-07-20 DIAGNOSIS — J9601 Acute respiratory failure with hypoxia: Secondary | ICD-10-CM | POA: Diagnosis present

## 2018-07-20 DIAGNOSIS — Z20828 Contact with and (suspected) exposure to other viral communicable diseases: Secondary | ICD-10-CM | POA: Diagnosis present

## 2018-07-20 DIAGNOSIS — I959 Hypotension, unspecified: Secondary | ICD-10-CM | POA: Diagnosis present

## 2018-07-20 DIAGNOSIS — J90 Pleural effusion, not elsewhere classified: Secondary | ICD-10-CM

## 2018-07-20 DIAGNOSIS — I5033 Acute on chronic diastolic (congestive) heart failure: Secondary | ICD-10-CM | POA: Diagnosis present

## 2018-07-20 LAB — COMPREHENSIVE METABOLIC PANEL
ALT: 32 U/L (ref 0–44)
AST: 47 U/L — ABNORMAL HIGH (ref 15–41)
Albumin: 2.3 g/dL — ABNORMAL LOW (ref 3.5–5.0)
Alkaline Phosphatase: 137 U/L — ABNORMAL HIGH (ref 38–126)
Anion gap: 9 (ref 5–15)
BUN: 16 mg/dL (ref 8–23)
CO2: 27 mmol/L (ref 22–32)
Calcium: 7.8 mg/dL — ABNORMAL LOW (ref 8.9–10.3)
Chloride: 92 mmol/L — ABNORMAL LOW (ref 98–111)
Creatinine, Ser: 1.24 mg/dL (ref 0.61–1.24)
GFR calc Af Amer: >60 mL/min (ref >60)
GFR calc non Af Amer: >60 mL/min (ref >60)
Glucose, Bld: 83 mg/dL (ref 70–99)
Potassium: 4.5 mmol/L (ref 3.5–5.1)
Sodium: 128 mmol/L — ABNORMAL LOW (ref 135–145)
Total Bilirubin: 1.4 mg/dL — ABNORMAL HIGH (ref 0.3–1.2)
Total Protein: 7.3 g/dL (ref 6.5–8.1)

## 2018-07-20 LAB — CBC WITH DIFFERENTIAL/PLATELET
Abs Immature Granulocytes: 0.13 10*3/uL — ABNORMAL HIGH (ref 0.00–0.07)
Basophils Absolute: 0.1 10*3/uL (ref 0.0–0.1)
Basophils Relative: 1 %
Eosinophils Absolute: 0.1 10*3/uL (ref 0.0–0.5)
Eosinophils Relative: 1 %
HCT: 36.1 % — ABNORMAL LOW (ref 39.0–52.0)
Hemoglobin: 11.7 g/dL — ABNORMAL LOW (ref 13.0–17.0)
Immature Granulocytes: 1 %
Lymphocytes Relative: 14 %
Lymphs Abs: 1.3 10*3/uL (ref 0.7–4.0)
MCH: 31 pg (ref 26.0–34.0)
MCHC: 32.4 g/dL (ref 30.0–36.0)
MCV: 95.8 fL (ref 80.0–100.0)
Monocytes Absolute: 1.2 10*3/uL — ABNORMAL HIGH (ref 0.1–1.0)
Monocytes Relative: 13 %
Neutro Abs: 6.3 10*3/uL (ref 1.7–7.7)
Neutrophils Relative %: 70 %
Platelets: 171 10*3/uL (ref 150–400)
RBC: 3.77 MIL/uL — ABNORMAL LOW (ref 4.22–5.81)
RDW: 18 % — ABNORMAL HIGH (ref 11.5–15.5)
WBC: 9.1 10*3/uL (ref 4.0–10.5)
nRBC: 0 % (ref 0.0–0.2)

## 2018-07-20 LAB — AMMONIA: Ammonia: 32 umol/L (ref 9–35)

## 2018-07-20 LAB — URINALYSIS, ROUTINE W REFLEX MICROSCOPIC
Bilirubin Urine: NEGATIVE
Glucose, UA: NEGATIVE mg/dL
Hgb urine dipstick: NEGATIVE
Ketones, ur: NEGATIVE mg/dL
Leukocytes,Ua: NEGATIVE
Nitrite: NEGATIVE
Protein, ur: NEGATIVE mg/dL
Specific Gravity, Urine: 1.009 (ref 1.005–1.030)
pH: 7 (ref 5.0–8.0)

## 2018-07-20 LAB — BRAIN NATRIURETIC PEPTIDE: B Natriuretic Peptide: 162 pg/mL — ABNORMAL HIGH (ref 0.0–100.0)

## 2018-07-20 LAB — TROPONIN I (HIGH SENSITIVITY)
Troponin I (High Sensitivity): 7 ng/L (ref <18)
Troponin I (High Sensitivity): 8 ng/L (ref <18)

## 2018-07-20 LAB — LIPASE, BLOOD: Lipase: 37 U/L (ref 11–51)

## 2018-07-20 LAB — SARS CORONAVIRUS 2 BY RT PCR (HOSPITAL ORDER, PERFORMED IN ~~LOC~~ HOSPITAL LAB): SARS Coronavirus 2: NEGATIVE

## 2018-07-20 LAB — LACTIC ACID, PLASMA
Lactic Acid, Venous: 1.1 mmol/L (ref 0.5–1.9)
Lactic Acid, Venous: 1 mmol/L (ref 0.5–1.9)

## 2018-07-20 MED ORDER — FUROSEMIDE 10 MG/ML IJ SOLN
INTRAMUSCULAR | Status: AC
  Filled 2018-07-20: qty 2

## 2018-07-20 MED ORDER — ACETAMINOPHEN 650 MG RE SUPP: 650.0000 mg | Freq: Four times a day (QID) | RECTAL | Status: DC | PRN

## 2018-07-20 MED ORDER — LIDOCAINE-EPINEPHRINE (PF) 1 %-1:200000 IJ SOLN
10.0000 mL | Freq: Once | INTRAMUSCULAR | Status: DC
  Filled 2018-07-20: qty 30

## 2018-07-20 MED ORDER — ONDANSETRON HCL 4 MG PO TABS: 4.0000 mg | ORAL_TABLET | Freq: Four times a day (QID) | ORAL | Status: DC | PRN

## 2018-07-20 MED ORDER — ENSURE ENLIVE PO LIQD
237.0000 mL | Freq: Two times a day (BID) | ORAL | Status: DC
  Administered 2018-07-21 – 2018-07-22 (×4): 237 mL via ORAL

## 2018-07-20 MED ORDER — SERTRALINE HCL 50 MG PO TABS
50.0000 mg | ORAL_TABLET | Freq: Every day | ORAL | Status: DC
  Administered 2018-07-20 – 2018-07-22 (×3): 50 mg via ORAL
  Filled 2018-07-20 (×3): qty 1

## 2018-07-20 MED ORDER — ATORVASTATIN CALCIUM 40 MG PO TABS
40.0000 mg | ORAL_TABLET | Freq: Every day | ORAL | Status: DC
  Administered 2018-07-20 – 2018-07-21 (×2): 40 mg via ORAL
  Filled 2018-07-20 (×2): qty 1

## 2018-07-20 MED ORDER — POTASSIUM CHLORIDE CRYS ER 20 MEQ PO TBCR
40.0000 meq | EXTENDED_RELEASE_TABLET | Freq: Two times a day (BID) | ORAL | Status: DC
  Administered 2018-07-20 – 2018-07-22 (×4): 40 meq via ORAL
  Filled 2018-07-20 (×4): qty 2

## 2018-07-20 MED ORDER — ONDANSETRON HCL 4 MG/2ML IJ SOLN: 4.0000 mg | Freq: Four times a day (QID) | INTRAMUSCULAR | Status: DC | PRN

## 2018-07-20 MED ORDER — TETANUS-DIPHTH-ACELL PERTUSSIS 5-2.5-18.5 LF-MCG/0.5 IM SUSP
0.5000 mL | Freq: Once | INTRAMUSCULAR | Status: AC
  Administered 2018-07-20: 0.5 mL via INTRAMUSCULAR
  Filled 2018-07-20: qty 0.5

## 2018-07-20 MED ORDER — GABAPENTIN 300 MG PO CAPS
600.0000 mg | ORAL_CAPSULE | Freq: Three times a day (TID) | ORAL | Status: DC
  Administered 2018-07-20 – 2018-07-22 (×6): 600 mg via ORAL
  Filled 2018-07-20 (×6): qty 2

## 2018-07-20 MED ORDER — SPIRONOLACTONE 25 MG PO TABS
25.0000 mg | ORAL_TABLET | Freq: Two times a day (BID) | ORAL | Status: DC
  Administered 2018-07-20 – 2018-07-22 (×4): 25 mg via ORAL
  Filled 2018-07-20 (×4): qty 1

## 2018-07-20 MED ORDER — FUROSEMIDE 10 MG/ML IJ SOLN
20.0000 mg | Freq: Once | INTRAMUSCULAR | Status: AC
  Administered 2018-07-20: 20 mg via INTRAVENOUS

## 2018-07-20 MED ORDER — SODIUM CHLORIDE 0.9 % IV BOLUS
500.0000 mL | Freq: Once | INTRAVENOUS | Status: AC
  Administered 2018-07-20: 500 mL via INTRAVENOUS

## 2018-07-20 MED ORDER — LACTULOSE 10 GM/15ML PO SOLN
10.0000 g | Freq: Three times a day (TID) | ORAL | Status: DC
  Administered 2018-07-20 – 2018-07-22 (×6): 10 g via ORAL
  Filled 2018-07-20 (×6): qty 30

## 2018-07-20 MED ORDER — POLYETHYLENE GLYCOL 3350 17 G PO PACK: 17.0000 g | PACK | Freq: Every day | ORAL | Status: DC | PRN

## 2018-07-20 MED ORDER — FERROUS SULFATE 325 (65 FE) MG PO TABS
325.0000 mg | ORAL_TABLET | Freq: Two times a day (BID) | ORAL | Status: DC
  Administered 2018-07-21 – 2018-07-22 (×3): 325 mg via ORAL
  Filled 2018-07-20 (×3): qty 1

## 2018-07-20 MED ORDER — ACETAMINOPHEN 325 MG PO TABS
650.0000 mg | ORAL_TABLET | Freq: Four times a day (QID) | ORAL | Status: DC | PRN
  Administered 2018-07-21: 650 mg via ORAL
  Filled 2018-07-20 (×2): qty 2

## 2018-07-20 MED ORDER — FUROSEMIDE 10 MG/ML IJ SOLN
20.0000 mg | Freq: Two times a day (BID) | INTRAMUSCULAR | Status: DC
  Administered 2018-07-20 – 2018-07-22 (×4): 20 mg via INTRAVENOUS
  Filled 2018-07-20 (×4): qty 2

## 2018-07-20 NOTE — Care Management (Signed)
Received call from Tim, [facility] rep. Patient is active for home heath nursing and physical therapy. Should patient be admitted we will resume home health services and update HHA.

## 2018-07-20 NOTE — Telephone Encounter (Signed)
Sister called to confirm torsemide dose and to also say that he was taken to [facility] ED after falling out of bed

## 2018-07-20 NOTE — ED Notes (Signed)
Pt sister called and reports pt is scheduled for an abd u/s on Wednesday to assess fluid accumulation

## 2018-07-20 NOTE — ED Notes (Signed)
Pt is too weak to stand to finish orthostatic vitals.

## 2018-07-20 NOTE — ED Triage Notes (Addendum)
Per EMS, pt was sitting on side of bed this am and reports fell asleep and fell off bed. Pt sister witnessed fall, pt denies dizziness/change in vision prior to or post fall. Abrasion with minimal bleeding noted to nose. Bleeding controlled with pressure. cbg 96.  C-collar applied by EMS. Pt only reports nose pain. Pt hypotensive upon arrival, pt placed in trendelenburg. BP 99/63 at this time.Pt 02 saturation 88-90% on room air. Pt placed on 2 liters with 95 o2 sat.  Pt is from home and reports had "two bed sores on bottom" and home health is treating him for cellulitus in BLE and dressing is due to be changed today.

## 2018-07-20 NOTE — ED Notes (Signed)
Lab at bedside obtaining blood specimen. 

## 2018-07-20 NOTE — ED Provider Notes (Signed)
[facility] EMERGENCY DEPARTMENT Provider Note   CSN: [id number] Arrival date & time: 07/20/18  1057    History   Chief Complaint Chief Complaint  Patient presents with   Fall    HPI Estefano Victory is a 68 y.o. male.     HPI   Patient is a 68 year old male with a history of bipolar disorder, hemophilia A, COPD, cirrhosis, hyperlipidemia, who presents to the emergency department today for evaluation after a fall.  Patient states he was sitting up on the side of his couch when he fell asleep and fell forward hitting his nose on the ground.  He denies any other pain from the fall.  He is complaining of pain to the right ankle and foot but states that this has been present since prior to the fall.  He was noted to be hypoxic on arrival, he states he has a chronic cough that is not worse than prior.  He does not feel short of breath.  He does not have any chest pain.  He denies abdominal pain, nausea, vomiting, diarrhea, fevers or urinary symptoms.  Denies headaches, vision changes or new unilateral numbness/weakness.  States he has a chronic history of left-sided weakness.  He also states he has chronic swelling to the right leg.  Past Medical History:  Diagnosis Date   Abnormal CXR (chest x-ray)    Agoraphobia    Anxiety    Bipolar disorder (HCC)    Chronic low back pain    Coagulopathy (HCC)    COPD (chronic obstructive pulmonary disease) (HCC)    Depression    Depression    Hemophilia (HCC)    Insomnia    Osteopenia    Peripheral edema    Peripheral neuropathy    Peripheral neuropathy    Vitamin D deficiency     Patient Active Problem List   Diagnosis Date Noted   Anemia 07/08/2018   Cirrhosis of liver without ascites ([place]) 11/05/2017   Portal hypertension ([place]) 11/05/2017   Hepatomegaly 09/21/2017   Blood alkaline phosphatase increased compared with prior measurement 09/21/2017   Hyperbilirubinemia 08/18/2017   Anasarca 07/24/2017    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   COPD (chronic obstructive pulmonary disease) ([place]) 07/22/2017   Hemophilia ([place])    Bipolar disorder ([place])    Chronic low back pain    Peripheral edema 03/03/2014   Insomnia 03/03/2014   Hypokalemia 10/21/2013   BMI 37.0-37.9, adult 06/28/2013   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 Authorizing Provider  atorvastatin (LIPITOR) 40 MG tablet Take 1 tablet (40 mg total) by mouth daily at 6 PM. 02/20/18  Yes Dettinger, Fransisca Kaufmann, MD  cholecalciferol (VITAMIN D) 1000 units tablet Take 2,000 Units by mouth daily.   Yes [provider]  ferrous sulfate 325 (65 FE) MG tablet Take 1 tablet (325 mg total) by mouth 2 (two) times daily with a meal. 02/20/18  Yes Dettinger,  Fransisca Kaufmann, MD  folic acid (FOLVITE) 1 MG tablet Take 1 tablet (1 mg total) by mouth daily at 2 PM. 02/20/18  Yes Dettinger, Fransisca Kaufmann, MD  gabapentin (NEURONTIN) 300 MG capsule TAKE 2 CAPSULES (600 MG TOTAL) BY MOUTH 3 (THREE) TIMES DAILY. 05/26/18  Yes Dettinger, Fransisca Kaufmann, MD  KLOR-CON M20 20 MEQ tablet TAKE 4 TABLETS BY MOUTH 2 TIMES DAILY Patient taking differently: 2 tablets BID 02/02/18  Yes Dettinger, Fransisca Kaufmann, MD  lactulose (CHRONULAC) 10 GM/15ML solution Take 15 mLs (10 g total) by mouth 3 (three) times daily. 11/28/17  Yes Dettinger, Fransisca Kaufmann, MD  RESTASIS 0.05 % ophthalmic emulsion Place 1  drop into both eyes daily. 01/28/18  Yes [provider]  sertraline (ZOLOFT) 50 MG tablet Take 1 tablet (50 mg total) by mouth daily. 02/20/18  Yes Dettinger, Fransisca Kaufmann, MD  spironolactone (ALDACTONE) 25 MG tablet Take 1 tablet (25 mg total) by mouth 2 (two) times daily. 02/20/18  Yes Dettinger, Fransisca Kaufmann, MD  sulfamethoxazole-trimethoprim (BACTRIM DS) 800-160 MG tablet Take 1 tablet by mouth 2 (two) times daily. 07/07/18  Yes [name] A, FNP  torsemide (DEMADEX) 20 MG tablet Take 60 mg am (3 tablets) and 40 mg (2 tablets) pm 07/15/18  Yes Branch, Alphonse Guild, MD  triamcinolone cream (KENALOG) 0.1 %  06/22/18  Yes [provider]  traZODone (DESYREL) 50 MG tablet TAKE 1 TO 2 TABLETS BY MOUTH AT BEDTIME AS NEEDED FOR SLEEP 06/22/18   Dettinger, Fransisca Kaufmann, MD  Wound Dressings (MEDIHONEY WOUND/BURN DRESSING) GEL Apply 1 application topically daily. Patient not taking: Reported on 07/20/2018 06/02/18   Dettinger, Fransisca Kaufmann, MD    Family History Family History  Problem Relation Age of Onset   Stroke Mother    Colon cancer Neg Hx    Esophageal cancer Neg Hx    Liver disease Neg Hx    Pancreatic cancer Neg Hx    Stomach cancer Neg Hx    Inflammatory bowel disease Neg Hx     Social History Social History   Tobacco Use   Smoking status: Current Every Day Smoker    Packs/day: 0.10    Years: 15.00    Pack years: 1.50    Types: Cigarettes    Last attempt to quit: 07/07/2017    Years since quitting: 1.0   Smokeless tobacco: Never Used  Substance Use Topics   Alcohol use: No   Drug use: No     Allergies   Doxycycline and Petrolatum   Review of Systems Review of Systems  Constitutional: Negative for chills and fever.  HENT: Negative for ear pain and sore throat.   Eyes: Negative for visual disturbance.  Respiratory: Negative for cough and shortness of breath.   Cardiovascular: Positive for leg swelling (chronic rle). Negative for chest pain.  Gastrointestinal:  Negative for abdominal pain, constipation, diarrhea, nausea and vomiting.  Genitourinary: Negative for dysuria and hematuria.  Musculoskeletal: Negative for back pain.  Skin: Positive for wound.  Neurological: Positive for weakness (chronic left sided). Negative for numbness.       Head trauma  All other systems reviewed and are negative.    Physical Exam Updated Vital Signs BP 103/77    Pulse (!) 120    Temp (!) 97.5 F (36.4 C) (Oral)    Resp 19    Ht 5\' 11"  (1.803 m)    Wt 90.7 kg    SpO2 98%    BMI 27.89 kg/m   Physical  Exam Vitals signs and nursing note reviewed.  Constitutional:      Appearance: He is well-developed.  HENT:     Head: Normocephalic.     Comments: Hematoma just superior to the left eyebrow.     Nose:     Comments: TTP to the nasal bridge with superficial laceration noted. No nasal septal hematoma. Dried blood in the bilat nares without active bleeding.     Mouth/Throat:     Mouth: Mucous membranes are dry.  Eyes:     Extraocular Movements: Extraocular movements intact.     Conjunctiva/sclera: Conjunctivae normal.     Pupils: Pupils are equal, round, and reactive to light.  Neck:     Musculoskeletal: Neck supple.  Cardiovascular:     Rate and Rhythm: Tachycardia present. Rhythm irregular.     Heart sounds: No murmur.     Comments: DP pulses dopplered bilaterally.  Pulmonary:     Effort: Pulmonary effort is normal. No respiratory distress.     Breath sounds: Wheezing and rhonchi present.  Abdominal:     General: Bowel sounds are normal.     Palpations: Abdomen is soft.     Tenderness: There is no abdominal tenderness.     Comments: Umbilical hernia that is soft and easily reducible. Skin changes to abdomen (pt states chronic for 1 year). 1+ edema to the right side of the abdomen.   Musculoskeletal:     Comments: TTP to the right lateral malleolus and lateral aspect of the right foot.   Skin:    General: Skin is warm and dry.     Comments: Chronic  skin changes and wounds to the BLE.   Neurological:     Mental Status: He is alert.     Comments: Mental Status:  Alert, thought content appropriate, able to give a coherent history. Speech fluent without evidence of aphasia. Able to follow 2 step commands without difficulty.  Cranial Nerves:  II:  pupils equal, round, reactive to light III,IV, VI: ptosis not present, extra-ocular motions intact bilaterally  V,VII: smile symmetric, facial light touch sensation equal VIII: hearing grossly normal to voice  X: uvula elevates symmetrically  XI: bilateral shoulder shrug symmetric and strong XII: midline tongue extension without fassiculations Motor:  Normal tone. 5/5 strength of BUE and BLE major muscle groups including strong and equal grip strength and dorsiflexion/plantar flexion Sensory: light touch normal in all extremities. Gait: not assessed CV: 2+ radial pulses Negative pronator drift               ED Treatments / Results  Labs (all labs ordered are listed, but only abnormal results are displayed) Labs Reviewed  CBC WITH DIFFERENTIAL/PLATELET - Abnormal; Notable for the following components:      Result Value   RBC 3.77 (*)    Hemoglobin 11.7 (*)    HCT 36.1 (*)    RDW 18.0 (*)    Monocytes Absolute 1.2 (*)    Abs Immature Granulocytes 0.13 (*)    All other components within normal limits  COMPREHENSIVE METABOLIC PANEL - Abnormal; Notable for the following components:   Sodium 128 (*)    Chloride 92 (*)    Calcium 7.8 (*)    Albumin 2.3 (*)    AST 47 (*)    Alkaline Phosphatase 137 (*)    Total Bilirubin 1.4 (*)    All other components within normal limits  BRAIN NATRIURETIC PEPTIDE - Abnormal; Notable for the following components:   B Natriuretic  Peptide 162.0 (*)    All other components within normal limits  CULTURE, BLOOD (ROUTINE X 2)  CULTURE, BLOOD (ROUTINE X 2)  SARS CORONAVIRUS 2 (HOSPITAL ORDER, PERFORMED IN [facility])  URINE  CULTURE  LIPASE, BLOOD  AMMONIA  LACTIC ACID, PLASMA  LACTIC ACID, PLASMA  URINALYSIS, ROUTINE W REFLEX MICROSCOPIC  TROPONIN I (HIGH SENSITIVITY)  TROPONIN I (HIGH SENSITIVITY)    EKG EKG Interpretation  Date/Time:  Monday July 20 2018 11:04:24 EDT Ventricular Rate:  122 PR Interval:    QRS Duration: 80 QT Interval:  277 QTC Calculation: 395 R Axis:   -122 Text Interpretation:  Sinus or ectopic atrial tachycardia Markedly posterior QRS axis Low voltage, extremity leads Borderline ST depression, diffuse leads Baseline wander When compared with ECG of 07/22/2017 Rate faster Nonspecific ST and T wave abnormality is now Present Confirmed by Francine Graven [phone]) on 07/20/2018 1:34:38 PM   Radiology Dg Ankle Complete Right  Result Date: 07/20/2018 CLINICAL DATA:  Recent fall with pain, initial encounter EXAM: RIGHT ANKLE - COMPLETE 3+ VIEW COMPARISON:  None. FINDINGS: There is no evidence of fracture, dislocation, or joint effusion. There is no evidence of arthropathy or other focal bone abnormality. Soft tissues are unremarkable. IMPRESSION: No acute abnormality noted. Electronically Signed   By: Inez Catalina M.D.   On: 07/20/2018 14:11   Ct Head Wo Contrast  Result Date: 07/20/2018 CLINICAL DATA:  Patient fell off of his bed. Trauma to the face. EXAM: CT HEAD WITHOUT CONTRAST CT MAXILLOFACIAL WITHOUT CONTRAST CT CERVICAL SPINE WITHOUT CONTRAST TECHNIQUE: Multidetector CT imaging of the head, cervical spine, and maxillofacial structures were performed using the standard protocol without intravenous contrast. Multiplanar CT image reconstructions of the cervical spine and maxillofacial structures were also generated. COMPARISON:  None. FINDINGS: CT HEAD FINDINGS Brain: No evidence of acute infarction, hemorrhage, hydrocephalus, extra-axial collection or mass lesion/mass effect. Mild brain parenchymal volume loss and deep white matter microangiopathy. Vascular: No hyperdense vessel or  unexpected calcification. Skull: Normal. Negative for fracture or focal lesion. Other: None. CT MAXILLOFACIAL FINDINGS Osseous: Minimally comminuted and displaced fracture of the tip of the nasal bones. Orbits: Negative. No traumatic or inflammatory finding. Sinuses: Clear. Soft tissues: Soft tissues allowing of the nose. CT CERVICAL SPINE FINDINGS Alignment: Reversal of cervical lordosis. Skull base and vertebrae: No acute fracture. No primary bone lesion or focal pathologic process. Superior endplate compression deformity of T1 and T2 vertebral bodies, likely degenerative. Soft tissues and spinal canal: No prevertebral fluid or swelling. No visible canal hematoma. Disc levels:  Multilevel osteoarthritic changes. Upper chest: Bilateral pleural effusions. Other: None. IMPRESSION: 1. No acute intracranial abnormality. 2. Atrophy, chronic microvascular disease. 3. Minimally comminuted and displaced fracture of the tip of the nasal bones. 4. No evidence of acute traumatic injury to the cervical spine. 5. Multilevel osteoarthritic changes of the cervical spine. 6. Superior endplate compression deformity of T1 and T2 vertebral bodies, likely degenerative. 7. Bilateral pleural effusions. Electronically Signed   By: Fidela Salisbury M.D.   On: 07/20/2018 15:06   Ct Cervical Spine Wo Contrast  Result Date: 07/20/2018 CLINICAL DATA:  Patient fell off of his bed. Trauma to the face. EXAM: CT HEAD WITHOUT CONTRAST CT MAXILLOFACIAL WITHOUT CONTRAST CT CERVICAL SPINE WITHOUT CONTRAST TECHNIQUE: Multidetector CT imaging of the head, cervical spine, and maxillofacial structures were performed using the standard protocol without intravenous contrast. Multiplanar CT image reconstructions of the cervical spine and maxillofacial structures were also generated. COMPARISON:  None. FINDINGS:  CT HEAD FINDINGS Brain: No evidence of acute infarction, hemorrhage, hydrocephalus, extra-axial collection or mass lesion/mass effect. Mild  brain parenchymal volume loss and deep white matter microangiopathy. Vascular: No hyperdense vessel or unexpected calcification. Skull: Normal. Negative for fracture or focal lesion. Other: None. CT MAXILLOFACIAL FINDINGS Osseous: Minimally comminuted and displaced fracture of the tip of the nasal bones. Orbits: Negative. No traumatic or inflammatory finding. Sinuses: Clear. Soft tissues: Soft tissues allowing of the nose. CT CERVICAL SPINE FINDINGS Alignment: Reversal of cervical lordosis. Skull base and vertebrae: No acute fracture. No primary bone lesion or focal pathologic process. Superior endplate compression deformity of T1 and T2 vertebral bodies, likely degenerative. Soft tissues and spinal canal: No prevertebral fluid or swelling. No visible canal hematoma. Disc levels:  Multilevel osteoarthritic changes. Upper chest: Bilateral pleural effusions. Other: None. IMPRESSION: 1. No acute intracranial abnormality. 2. Atrophy, chronic microvascular disease. 3. Minimally comminuted and displaced fracture of the tip of the nasal bones. 4. No evidence of acute traumatic injury to the cervical spine. 5. Multilevel osteoarthritic changes of the cervical spine. 6. Superior endplate compression deformity of T1 and T2 vertebral bodies, likely degenerative. 7. Bilateral pleural effusions. Electronically Signed   By: Fidela Salisbury M.D.   On: 07/20/2018 15:06   Dg Chest Portable 1 View  Result Date: 07/20/2018 CLINICAL DATA:  68 year old with acute hypoxia. Patient was sitting on the side of her bed this morning and fell asleep and fell out of bed. Abrasion to the nose. Initial encounter. EXAM: PORTABLE CHEST 1 VIEW COMPARISON:  07/22/2017 and earlier. FINDINGS: Cardiac silhouette moderately to markedly enlarged, unchanged. Mild to moderate diffuse interstitial pulmonary edema. Large RIGHT pleural effusion and small LEFT pleural effusion. Associated consolidation in the RIGHT LOWER LOBE and RIGHT MIDDLE LOBE.  IMPRESSION: 1. Mild-to-moderate CHF, with stable moderate to marked cardiomegaly and mild to moderate diffuse interstitial pulmonary edema. 2. Large RIGHT pleural effusion and small LEFT pleural effusion. 3. Associated passive atelectasis involving the RIGHT MIDDLE LOBE and RIGHT LOWER LOBE (favored over pneumonia). Electronically Signed   By: Evangeline Dakin M.D.   On: 07/20/2018 14:17   Dg Foot Complete Right  Result Date: 07/20/2018 CLINICAL DATA:  Recent fall with foot pain, initial encounter EXAM: RIGHT FOOT COMPLETE - 3+ VIEW COMPARISON:  None. FINDINGS: There is no evidence of fracture or dislocation. There is no evidence of arthropathy or other focal bone abnormality. Soft tissues are unremarkable. IMPRESSION: No acute abnormality noted Electronically Signed   By: Inez Catalina M.D.   On: 07/20/2018 14:11   Ct Maxillofacial Wo Contrast  Result Date: 07/20/2018 CLINICAL DATA:  Patient fell off of his bed. Trauma to the face. EXAM: CT HEAD WITHOUT CONTRAST CT MAXILLOFACIAL WITHOUT CONTRAST CT CERVICAL SPINE WITHOUT CONTRAST TECHNIQUE: Multidetector CT imaging of the head, cervical spine, and maxillofacial structures were performed using the standard protocol without intravenous contrast. Multiplanar CT image reconstructions of the cervical spine and maxillofacial structures were also generated. COMPARISON:  None. FINDINGS: CT HEAD FINDINGS Brain: No evidence of acute infarction, hemorrhage, hydrocephalus, extra-axial collection or mass lesion/mass effect. Mild brain parenchymal volume loss and deep white matter microangiopathy. Vascular: No hyperdense vessel or unexpected calcification. Skull: Normal. Negative for fracture or focal lesion. Other: None. CT MAXILLOFACIAL FINDINGS Osseous: Minimally comminuted and displaced fracture of the tip of the nasal bones. Orbits: Negative. No traumatic or inflammatory finding. Sinuses: Clear. Soft tissues: Soft tissues allowing of the nose. CT CERVICAL SPINE  FINDINGS Alignment: Reversal of cervical lordosis. Skull base  and vertebrae: No acute fracture. No primary bone lesion or focal pathologic process. Superior endplate compression deformity of T1 and T2 vertebral bodies, likely degenerative. Soft tissues and spinal canal: No prevertebral fluid or swelling. No visible canal hematoma. Disc levels:  Multilevel osteoarthritic changes. Upper chest: Bilateral pleural effusions. Other: None. IMPRESSION: 1. No acute intracranial abnormality. 2. Atrophy, chronic microvascular disease. 3. Minimally comminuted and displaced fracture of the tip of the nasal bones. 4. No evidence of acute traumatic injury to the cervical spine. 5. Multilevel osteoarthritic changes of the cervical spine. 6. Superior endplate compression deformity of T1 and T2 vertebral bodies, likely degenerative. 7. Bilateral pleural effusions. Electronically Signed   By: Fidela Salisbury M.D.   On: 07/20/2018 15:06    Procedures .Marland KitchenLaceration Repair  Date/Time: 07/20/2018 5:22 PM Performed by: Rodney Booze, PA-C Authorized by: Rodney Booze, PA-C   Consent:    Consent obtained:  Verbal   Consent given by:  Patient   Risks discussed:  Infection, need for additional repair, pain, poor cosmetic result and poor wound healing   Alternatives discussed:  No treatment and delayed treatment Universal protocol:    Procedure explained and questions answered to patient or proxy's satisfaction: yes     Relevant documents present and verified: yes     Test results available and properly labeled: yes     Imaging studies available: yes     Required blood products, implants, devices, and special equipment available: yes     Site/side marked: yes     Immediately prior to procedure, a time out was called: yes     Patient identity confirmed:  Verbally with patient Anesthesia (see MAR for exact dosages):    Anesthesia method:  None Laceration details:    Location: nose.   Length (cm):  1.5    Laceration depth: superficial. Repair type:    Repair type:  Simple Exploration:    Hemostasis achieved with:  Direct pressure   Wound exploration: wound explored through full range of motion     Contaminated: no   Treatment:    Amount of cleaning:  Standard   Irrigation solution:  Sterile saline   Irrigation method:  Syringe   Visualized foreign bodies/material removed: no   Skin repair:    Repair method:  Tissue adhesive Approximation:    Approximation:  Close Post-procedure details:    Dressing:  Open (no dressing)   Patient tolerance of procedure:  Tolerated well, no immediate complications   (including critical care time) CRITICAL CARE Performed by: Rodney Booze   Total critical care time: 31 minutes  Critical care time was exclusive of separately billable procedures and treating other patients.  Critical care was necessary to treat or prevent imminent or life-threatening deterioration.  Critical care was time spent personally by me on the following activities: development of treatment plan with patient and/or surrogate as well as nursing, discussions with consultants, evaluation of patient's response to treatment, examination of patient, obtaining history from patient or surrogate, ordering and performing treatments and interventions, ordering and review of laboratory studies, ordering and review of radiographic studies, pulse oximetry and re-evaluation of patient's condition.   Medications Ordered in ED Medications  lidocaine-EPINEPHrine (XYLOCAINE-EPINEPHrine) 1 %-1:200000 (PF) injection 10 mL (has no administration in time range)  sodium chloride 0.9 % bolus 500 mL (0 mLs Intravenous Stopped 07/20/18 1425)  furosemide (LASIX) injection 20 mg (20 mg Intravenous Given 07/20/18 1552)  Tdap (BOOSTRIX) injection 0.5 mL (0.5 mLs Intramuscular Given 07/20/18  1619)     Initial Impression / Assessment and Plan / ED Course  I have reviewed the triage vital signs and the  nursing notes.  Pertinent labs & imaging results that were available during my care of the patient were reviewed by me and considered in my medical decision making (see chart for details).    Final Clinical Impressions(s) / ED Diagnoses   Final diagnoses:  Acute on chronic congestive heart failure, unspecified heart failure type ([place])  Hypoxia  Pleural effusion, bilateral  Generalized weakness   Pt presenting for eval after a fall.  EMS found pt to be hypoxic and tachycardic. Pt borderline hypotensive but afebrile. Orthostatics attempted and pt orthostatic with sitting. He was unable to complete orthostatics as he was too weak to stand.   CBC with no leukocytosis making infectious etiology of sxs less likely. Mild anemia which is marginally worse than prior.  CMP with sodium of 128, chloride of 92.  AST elevated at 47, consistent with prior.  Total bilirubin elevated at 1.4 consistent with prior as well. Lipase is negative Lactic acid is negative Blood cultures obtained  Ammonia is negative UA negative COVID is negative  EKG with Sinus or ectopic atrial tachycardia, Markedly posterior QRS axis Low voltage, extremity leads Borderline ST depression, diffuse leads Baseline wander When compared with ECG of 07/22/2017 Rate faster Nonspecific ST and T wave abnormality is now Present  CXR with Mild-to-moderate CHF, with stable moderate to marked cardiomegaly and mild to moderate diffuse interstitial pulmonary edema. 2. Large RIGHT pleural effusion and small LEFT pleural effusion. 3. Associated passive atelectasis involving the RIGHT MIDDLE LOBE and RIGHT LOWER LOBE (favored over pneumonia).  CT head with no acute intracranial abnormality CT maxillofacial with minimally comminuted and displaced fracture of the tip of the nasal bones CT cervical spine without acute traumatic injury of the cervical spine, degenerative compression deformity of the T1 and T2 vertebral bodies.   Xray right ankle  negative Xray right foot negative  12:55 PM CONSULT with Dr. Delton Coombes with heme-onc who recommends obtaining imaging of the pts brain. If there is evidence of intracranial bleeding, then give Novoseven 69mcg/kg. If not, then okay to hold administration of Novoseven.  Pt with new hypoxia, likely multifactorial in setting of CHF with bilat pleural effusions and chronic COPD. COPD exacerbation seems less likely at this time. He has been borderline hypotensive during his stay and appears intravascularly volume depleted. He will need admission for both diuresis and IVF hydration.   Pt seen in conjunction with Dr. Roderic Palau who personally evaluated this patient and is in agreement with the plan.   5:18 PM CONSULT with Dr. Denton Brick who accepts pt for admission.   ED Discharge Orders    None       Bishop Dublin 07/20/18 Maryagnes Amos, MD 07/22/18 [phone]

## 2018-07-20 NOTE — H&P (Signed)
History and Physical    Kent Bryant BMW:[id number] DOB: 1950/09/19 DOA: 07/20/2018  PCP: Dettinger, Fransisca Kaufmann, MD   Patient coming from: Home  I have personally briefly reviewed patient's old medical records in [place]  Chief Complaint: Fall  HPI: Kent Bryant is a 68 y.o. male with medical history significant for liver cirrhosis, COPD, bipolar disorder, hemophilia, constrictive pericarditis and heart failure, hypertension.  Patient was brought to the ED via EMS with reports of patient falling off his bed.  Patient tells me he was sitting up, fell asleep while sitting up and fell forward.  Fall was witnessed by his sister.  Patient denies chest pain or difficulty breathing.  He tells me he just slept off.  He did not pass out. He denies vomiting or loose stools, he reports that he normally has good p.o. intake but has not eaten since yesterday.  He has chronic lower extremity redness, this is unchanged.  He has no pain in his lower extremities. EMS gave 500 mill bolus in route.  ED Course: Heart rate 120s, mild intermittent tachypnea to 23, blood pressure systolic range 27-253, O2 sats per EDP -88 to 90% on room air, with improvement in O2 sats on 2 L O2.  Sodium 128.  Creatinine stable at 1.24.  BNP 162, improved compared to prior.  EKG sinus versus ectopic atrial tachycardia.  Normal lactic acid 1.  Normal ammonia 32.  Patient included portable chest x-ray which showed mild to moderate CHF, cardiomegaly, mild to moderate diffuse interstitial pulmonary edema, large right pleural effusion and small left pleural effusion, possible atelectasis right middle and lower lobe less likely pneumonia.  Right ankle and foot x-ray negative for acute abnormality.  Cervical and maxillofacial CT obtained-no acute intracranial abnormality, mildly comminuted and displaced fracture of the tip of the nasal bones. Patient was given 500 mill bolus in the ED for low blood pressure, and IV 20 mg Lasix.  EDP also  talked to Dr. Delton Coombes in the light of his hemophilia, recommended giving NovoSeven if patient has intracranial bleed.  Hospitalist to admit for pleural effusion and hypotension.  Review of Systems: As per HPI all other systems reviewed and negative.  Past Medical History:  Diagnosis Date   Abnormal CXR (chest x-ray)    Agoraphobia    Anxiety    Bipolar disorder (HCC)    Chronic low back pain    Coagulopathy (HCC)    COPD (chronic obstructive pulmonary disease) (HCC)    Depression    Depression    Hemophilia (HCC)    Insomnia    Osteopenia    Peripheral edema    Peripheral neuropathy    Peripheral neuropathy    Vitamin D deficiency     Past Surgical History:  Procedure Laterality Date   CATARACT EXTRACTION  05/29/2017   CATARACT EXTRACTION W/PHACO Left 06/13/2017   Procedure: CATARACT EXTRACTION PHACO AND INTRAOCULAR LENS PLACEMENT LEFT EYE;  Surgeon: Baruch Goldmann, MD;  Location: [facility];  Service: Ophthalmology;  Laterality: Left;  CDE: 23.32     reports that he has been smoking cigarettes. He has a 1.50 pack-year smoking history. He has never used smokeless tobacco. He reports that he does not drink alcohol or use drugs.  Allergies  Allergen Reactions   Doxycycline Dermatitis and Itching   Petrolatum Rash    Family History  Problem Relation Age of Onset   Stroke Mother    Colon cancer Neg Hx    Esophageal cancer Neg Hx  Liver disease Neg Hx    Pancreatic cancer Neg Hx    Stomach cancer Neg Hx    Inflammatory bowel disease Neg Hx     Prior to Admission medications   Medication Sig Start Date End Date Taking? Authorizing Provider  atorvastatin (LIPITOR) 40 MG tablet Take 1 tablet (40 mg total) by mouth daily at 6 PM. 02/20/18  Yes Dettinger, Fransisca Kaufmann, MD  cholecalciferol (VITAMIN D) 1000 units tablet Take 2,000 Units by mouth daily.   Yes [provider]  ferrous sulfate 325 (65 FE) MG tablet Take 1 tablet (325 mg total) by  mouth 2 (two) times daily with a meal. 02/20/18  Yes Dettinger, Fransisca Kaufmann, MD  folic acid (FOLVITE) 1 MG tablet Take 1 tablet (1 mg total) by mouth daily at 2 PM. 02/20/18  Yes Dettinger, Fransisca Kaufmann, MD  gabapentin (NEURONTIN) 300 MG capsule TAKE 2 CAPSULES (600 MG TOTAL) BY MOUTH 3 (THREE) TIMES DAILY. 05/26/18  Yes Dettinger, Fransisca Kaufmann, MD  KLOR-CON M20 20 MEQ tablet TAKE 4 TABLETS BY MOUTH 2 TIMES DAILY Patient taking differently: 2 tablets BID 02/02/18  Yes Dettinger, Fransisca Kaufmann, MD  lactulose (CHRONULAC) 10 GM/15ML solution Take 15 mLs (10 g total) by mouth 3 (three) times daily. 11/28/17  Yes Dettinger, Fransisca Kaufmann, MD  RESTASIS 0.05 % ophthalmic emulsion Place 1 drop into both eyes daily. 01/28/18  Yes [provider]  sertraline (ZOLOFT) 50 MG tablet Take 1 tablet (50 mg total) by mouth daily. 02/20/18  Yes Dettinger, Fransisca Kaufmann, MD  spironolactone (ALDACTONE) 25 MG tablet Take 1 tablet (25 mg total) by mouth 2 (two) times daily. 02/20/18  Yes Dettinger, Fransisca Kaufmann, MD  sulfamethoxazole-trimethoprim (BACTRIM DS) 800-160 MG tablet Take 1 tablet by mouth 2 (two) times daily. 07/07/18  Yes [name] A, FNP  torsemide (DEMADEX) 20 MG tablet Take 60 mg am (3 tablets) and 40 mg (2 tablets) pm 07/15/18  Yes Branch, Alphonse Guild, MD  triamcinolone cream (KENALOG) 0.1 %  06/22/18  Yes [provider]  traZODone (DESYREL) 50 MG tablet TAKE 1 TO 2 TABLETS BY MOUTH AT BEDTIME AS NEEDED FOR SLEEP 06/22/18   Dettinger, Fransisca Kaufmann, MD  Wound Dressings (MEDIHONEY WOUND/BURN DRESSING) GEL Apply 1 application topically daily. Patient not taking: Reported on 07/20/2018 06/02/18   Dettinger, Fransisca Kaufmann, MD    Physical Exam: Vitals:   07/20/18 1830 07/20/18 1900 07/20/18 1915 07/20/18 1920  BP: 107/67 104/70    Pulse:  (!) 123    Resp: 19  (!) 23   Temp:    97.8 F (36.6 C)  TempSrc:    Oral  SpO2:  92%    Weight:      Height:        Constitutional: Sleeping but easily arousable, answers questions  appropriately. Vitals:   07/20/18 1830 07/20/18 1900 07/20/18 1915 07/20/18 1920  BP: 107/67 104/70    Pulse:  (!) 123    Resp: 19  (!) 23   Temp:    97.8 F (36.6 C)  TempSrc:    Oral  SpO2:  92%    Weight:      Height:       Eyes: PERRL, lids and conjunctivae normal ENMT: Mucous membranes are dry. Posterior pharynx clear of any exudate or lesions. Bruising to bridge of nose. Neck: normal, supple, no masses, no thyromegaly Respiratory: clear to auscultation bilaterally, no wheezing, no crackles appreciated. Normal respiratory effort. No accessory muscle use.  Cardiovascular: Regular rate  and rhythm, no murmurs / rubs / gallops. No extremity edema. 2+ pedal pulses.  Abdomen: Large old appearing ecchymotic area to mostly infraumbilical and left side of abdomen, nontender- patient tells me he woke up and saw it one morning., no masses palpated. No hepatosplenomegaly.   Musculoskeletal: no clubbing / cyanosis. No joint deformity upper and lower extremities. Good ROM, no contractures. Normal muscle tone.  Skin: Stage 2 Pressure ulcers on butt region.  Chronic stasis changes on bilateral lower extremities, with hyperpigmentation, mild erythema bilaterally that is unchanged per patient, with scaling.  Without tenderness differential warmth.  Right leg slightly bigger than left leg this is normal per patient Neurologic: CN 2-12 grossly intact.  Strength 5/5 in all 4.  Psychiatric: Normal judgment and insight. Alert and oriented x 3. Normal mood.           Labs on Admission: I have personally reviewed following labs and imaging studies  CBC: Recent Labs  Lab 07/20/18 1310  WBC 9.1  NEUTROABS 6.3  HGB 11.7*  HCT 36.1*  MCV 95.8  PLT 161   Basic Metabolic Panel: Recent Labs  Lab 07/20/18 1310  NA 128*  K 4.5  CL 92*  CO2 27  GLUCOSE 83  BUN 16  CREATININE 1.24  CALCIUM 7.8*   Liver Function Tests: Recent Labs  Lab 07/20/18 1310  AST 47*  ALT 32  ALKPHOS 137*    BILITOT 1.4*  PROT 7.3  ALBUMIN 2.3*   Recent Labs  Lab 07/20/18 1310  LIPASE 37   Recent Labs  Lab 07/20/18 1312  AMMONIA 32   BNP (last 3 results) Recent Labs    09/19/17 1040  PROBNP 312.0*   Urine analysis:    Component Value Date/Time   COLORURINE YELLOW 07/20/2018 1236   APPEARANCEUR CLEAR 07/20/2018 1236   LABSPEC 1.009 07/20/2018 1236   PHURINE 7.0 07/20/2018 1236   [place] 07/20/2018 1236   [place] NEGATIVE 07/20/2018 1236   [place] NEGATIVE 07/20/2018 1236   Terminous 07/20/2018 1236   PROTEINUR NEGATIVE 07/20/2018 1236   NITRITE NEGATIVE 07/20/2018 1236   LEUKOCYTESUR NEGATIVE 07/20/2018 1236    Radiological Exams on Admission: Dg Ankle Complete Right  Result Date: 07/20/2018 CLINICAL DATA:  Recent fall with pain, initial encounter EXAM: RIGHT ANKLE - COMPLETE 3+ VIEW COMPARISON:  None. FINDINGS: There is no evidence of fracture, dislocation, or joint effusion. There is no evidence of arthropathy or other focal bone abnormality. Soft tissues are unremarkable. IMPRESSION: No acute abnormality noted. Electronically Signed   By: Inez Catalina M.D.   On: 07/20/2018 14:11   Ct Head Wo Contrast  Result Date: 07/20/2018 CLINICAL DATA:  Patient fell off of his bed. Trauma to the face. EXAM: CT HEAD WITHOUT CONTRAST CT MAXILLOFACIAL WITHOUT CONTRAST CT CERVICAL SPINE WITHOUT CONTRAST TECHNIQUE: Multidetector CT imaging of the head, cervical spine, and maxillofacial structures were performed using the standard protocol without intravenous contrast. Multiplanar CT image reconstructions of the cervical spine and maxillofacial structures were also generated. COMPARISON:  None. FINDINGS: CT HEAD FINDINGS Brain: No evidence of acute infarction, hemorrhage, hydrocephalus, extra-axial collection or mass lesion/mass effect. Mild brain parenchymal volume loss and deep white matter microangiopathy. Vascular: No hyperdense vessel or unexpected calcification.  Skull: Normal. Negative for fracture or focal lesion. Other: None. CT MAXILLOFACIAL FINDINGS Osseous: Minimally comminuted and displaced fracture of the tip of the nasal bones. Orbits: Negative. No traumatic or inflammatory finding. Sinuses: Clear. Soft tissues: Soft tissues allowing of the nose. CT  CERVICAL SPINE FINDINGS Alignment: Reversal of cervical lordosis. Skull base and vertebrae: No acute fracture. No primary bone lesion or focal pathologic process. Superior endplate compression deformity of T1 and T2 vertebral bodies, likely degenerative. Soft tissues and spinal canal: No prevertebral fluid or swelling. No visible canal hematoma. Disc levels:  Multilevel osteoarthritic changes. Upper chest: Bilateral pleural effusions. Other: None. IMPRESSION: 1. No acute intracranial abnormality. 2. Atrophy, chronic microvascular disease. 3. Minimally comminuted and displaced fracture of the tip of the nasal bones. 4. No evidence of acute traumatic injury to the cervical spine. 5. Multilevel osteoarthritic changes of the cervical spine. 6. Superior endplate compression deformity of T1 and T2 vertebral bodies, likely degenerative. 7. Bilateral pleural effusions. Electronically Signed   By: Fidela Salisbury M.D.   On: 07/20/2018 15:06   Ct Cervical Spine Wo Contrast  Result Date: 07/20/2018 CLINICAL DATA:  Patient fell off of his bed. Trauma to the face. EXAM: CT HEAD WITHOUT CONTRAST CT MAXILLOFACIAL WITHOUT CONTRAST CT CERVICAL SPINE WITHOUT CONTRAST TECHNIQUE: Multidetector CT imaging of the head, cervical spine, and maxillofacial structures were performed using the standard protocol without intravenous contrast. Multiplanar CT image reconstructions of the cervical spine and maxillofacial structures were also generated. COMPARISON:  None. FINDINGS: CT HEAD FINDINGS Brain: No evidence of acute infarction, hemorrhage, hydrocephalus, extra-axial collection or mass lesion/mass effect. Mild brain parenchymal volume  loss and deep white matter microangiopathy. Vascular: No hyperdense vessel or unexpected calcification. Skull: Normal. Negative for fracture or focal lesion. Other: None. CT MAXILLOFACIAL FINDINGS Osseous: Minimally comminuted and displaced fracture of the tip of the nasal bones. Orbits: Negative. No traumatic or inflammatory finding. Sinuses: Clear. Soft tissues: Soft tissues allowing of the nose. CT CERVICAL SPINE FINDINGS Alignment: Reversal of cervical lordosis. Skull base and vertebrae: No acute fracture. No primary bone lesion or focal pathologic process. Superior endplate compression deformity of T1 and T2 vertebral bodies, likely degenerative. Soft tissues and spinal canal: No prevertebral fluid or swelling. No visible canal hematoma. Disc levels:  Multilevel osteoarthritic changes. Upper chest: Bilateral pleural effusions. Other: None. IMPRESSION: 1. No acute intracranial abnormality. 2. Atrophy, chronic microvascular disease. 3. Minimally comminuted and displaced fracture of the tip of the nasal bones. 4. No evidence of acute traumatic injury to the cervical spine. 5. Multilevel osteoarthritic changes of the cervical spine. 6. Superior endplate compression deformity of T1 and T2 vertebral bodies, likely degenerative. 7. Bilateral pleural effusions. Electronically Signed   By: Fidela Salisbury M.D.   On: 07/20/2018 15:06   Dg Chest Portable 1 View  Result Date: 07/20/2018 CLINICAL DATA:  68 year old with acute hypoxia. Patient was sitting on the side of her bed this morning and fell asleep and fell out of bed. Abrasion to the nose. Initial encounter. EXAM: PORTABLE CHEST 1 VIEW COMPARISON:  07/22/2017 and earlier. FINDINGS: Cardiac silhouette moderately to markedly enlarged, unchanged. Mild to moderate diffuse interstitial pulmonary edema. Large RIGHT pleural effusion and small LEFT pleural effusion. Associated consolidation in the RIGHT LOWER LOBE and RIGHT MIDDLE LOBE. IMPRESSION: 1.  Mild-to-moderate CHF, with stable moderate to marked cardiomegaly and mild to moderate diffuse interstitial pulmonary edema. 2. Large RIGHT pleural effusion and small LEFT pleural effusion. 3. Associated passive atelectasis involving the RIGHT MIDDLE LOBE and RIGHT LOWER LOBE (favored over pneumonia). Electronically Signed   By: Evangeline Dakin M.D.   On: 07/20/2018 14:17   Dg Foot Complete Right  Result Date: 07/20/2018 CLINICAL DATA:  Recent fall with foot pain, initial encounter EXAM: RIGHT FOOT COMPLETE -  3+ VIEW COMPARISON:  None. FINDINGS: There is no evidence of fracture or dislocation. There is no evidence of arthropathy or other focal bone abnormality. Soft tissues are unremarkable. IMPRESSION: No acute abnormality noted Electronically Signed   By: Inez Catalina M.D.   On: 07/20/2018 14:11   Ct Maxillofacial Wo Contrast  Result Date: 07/20/2018 CLINICAL DATA:  Patient fell off of his bed. Trauma to the face. EXAM: CT HEAD WITHOUT CONTRAST CT MAXILLOFACIAL WITHOUT CONTRAST CT CERVICAL SPINE WITHOUT CONTRAST TECHNIQUE: Multidetector CT imaging of the head, cervical spine, and maxillofacial structures were performed using the standard protocol without intravenous contrast. Multiplanar CT image reconstructions of the cervical spine and maxillofacial structures were also generated. COMPARISON:  None. FINDINGS: CT HEAD FINDINGS Brain: No evidence of acute infarction, hemorrhage, hydrocephalus, extra-axial collection or mass lesion/mass effect. Mild brain parenchymal volume loss and deep white matter microangiopathy. Vascular: No hyperdense vessel or unexpected calcification. Skull: Normal. Negative for fracture or focal lesion. Other: None. CT MAXILLOFACIAL FINDINGS Osseous: Minimally comminuted and displaced fracture of the tip of the nasal bones. Orbits: Negative. No traumatic or inflammatory finding. Sinuses: Clear. Soft tissues: Soft tissues allowing of the nose. CT CERVICAL SPINE FINDINGS Alignment:  Reversal of cervical lordosis. Skull base and vertebrae: No acute fracture. No primary bone lesion or focal pathologic process. Superior endplate compression deformity of T1 and T2 vertebral bodies, likely degenerative. Soft tissues and spinal canal: No prevertebral fluid or swelling. No visible canal hematoma. Disc levels:  Multilevel osteoarthritic changes. Upper chest: Bilateral pleural effusions. Other: None. IMPRESSION: 1. No acute intracranial abnormality. 2. Atrophy, chronic microvascular disease. 3. Minimally comminuted and displaced fracture of the tip of the nasal bones. 4. No evidence of acute traumatic injury to the cervical spine. 5. Multilevel osteoarthritic changes of the cervical spine. 6. Superior endplate compression deformity of T1 and T2 vertebral bodies, likely degenerative. 7. Bilateral pleural effusions. Electronically Signed   By: Fidela Salisbury M.D.   On: 07/20/2018 15:06    EKG: Independently reviewed.  Sinus/ectopic atrial tachycardia.  Rate 122.  QTc 395.  Low voltage-but appears similar to prior EKGs.  Assessment/Plan Active Problems:   Hypoxia   Hypoxia- mild, O2 sats 88 to 90% on room air.  Likely secondary to pleural effusions and interstitial pulmonary edema, as seen on chest x-ray.  Not on home O2. -Will need diuresis  CHF with preserved EF, constrictive pericarditis- last echo 11/2017 EF 60 to 65%, indeterminate diastolic function.  Chest x-ray shows mild to moderate diffuse interstitial edema, large right pleural effusion and small left pleural effusion.  He follows with cardiology Dr. Harl Bowie.  He is not felt to be a candidate for pericardiectomy.  BNP 162 improved compared to prior.  His weights over the past 6 months have improved/are stable.  High-sensitivity troponin x2-.  EKG without significant changes.  Home medications torsemide 60 a.m. 40 p.m.  1 L bolus and IV Lasix 20 mg X 1 given earlier today.  Patient's blood pressure appears chronically low, down to  80s in the past. Systolic down to 97 today. - Will repeat 20 mg IV Lasix given a total of 40 mg Lasix today -Continue IV Lasix 20 mg twice daily for now uptitrate dose as blood pressure tolerates - Hold home torsemide -Continue home potassium supplements, spironolactone - BMP a.m - Strict input output -  Daily weights - If still hypoxic or tenuous respiratory status may benefit from right thoracocentesis.  Liver cirrhosis- likely contributing to his hypervolemic status and  hypotension.  Albumin is 2.3.  Follows with gastroenterology.  Abdomen appears full but not distended -Continue Lasix as blood pressure tolerates -Continue home lactulose and spironolactone  Nasal fracture-maxillofacial CT shows mildly comminuted displaced fracture of the tip of the nasal bones. -Follow-up with ENT as outpatient.  Fall-patient reports he was sleeping while sitting up in bed and subsequent fall.  Head CT negative for acute intracranial abnormality. -PT eval in a.m.  Hemophilia-stable.  Depression -continue home Zoloft  DVT prophylaxis: SCds-  Will hold pharmacologic DVT prophylaxis with hemophilia and nasal fracture. Code Status: Full Family Communication: None at bedside Disposition Plan: Per rounding team Consults called: None Admission status: Obs, Tele   Bethena Roys MD [facility]  07/20/2018, 8:20 PM

## 2018-07-20 NOTE — ED Notes (Signed)
Pt in radiology at this time. 

## 2018-07-21 ENCOUNTER — Telehealth: Payer: Self-pay | Admitting: Gastroenterology

## 2018-07-21 ENCOUNTER — Encounter (HOSPITAL_COMMUNITY): Payer: Self-pay | Admitting: Family Medicine

## 2018-07-21 ENCOUNTER — Telehealth: Payer: Self-pay | Admitting: Family Medicine

## 2018-07-21 DIAGNOSIS — D649 Anemia, unspecified: Secondary | ICD-10-CM | POA: Diagnosis present

## 2018-07-21 DIAGNOSIS — I110 Hypertensive heart disease with heart failure: Secondary | ICD-10-CM | POA: Diagnosis present

## 2018-07-21 DIAGNOSIS — J9601 Acute respiratory failure with hypoxia: Secondary | ICD-10-CM | POA: Diagnosis present

## 2018-07-21 DIAGNOSIS — S022XXA Fracture of nasal bones, initial encounter for closed fracture: Secondary | ICD-10-CM | POA: Diagnosis present

## 2018-07-21 DIAGNOSIS — I5033 Acute on chronic diastolic (congestive) heart failure: Secondary | ICD-10-CM | POA: Diagnosis present

## 2018-07-21 DIAGNOSIS — I311 Chronic constrictive pericarditis: Secondary | ICD-10-CM | POA: Diagnosis present

## 2018-07-21 DIAGNOSIS — J449 Chronic obstructive pulmonary disease, unspecified: Secondary | ICD-10-CM | POA: Diagnosis present

## 2018-07-21 DIAGNOSIS — Z823 Family history of stroke: Secondary | ICD-10-CM | POA: Diagnosis not present

## 2018-07-21 DIAGNOSIS — K7460 Unspecified cirrhosis of liver: Secondary | ICD-10-CM | POA: Diagnosis present

## 2018-07-21 DIAGNOSIS — D66 Hereditary factor VIII deficiency: Secondary | ICD-10-CM | POA: Diagnosis present

## 2018-07-21 DIAGNOSIS — Z23 Encounter for immunization: Secondary | ICD-10-CM | POA: Diagnosis present

## 2018-07-21 DIAGNOSIS — Y92003 Bedroom of unspecified non-institutional (private) residence as the place of occurrence of the external cause: Secondary | ICD-10-CM | POA: Diagnosis not present

## 2018-07-21 DIAGNOSIS — Z20828 Contact with and (suspected) exposure to other viral communicable diseases: Secondary | ICD-10-CM | POA: Diagnosis present

## 2018-07-21 DIAGNOSIS — W06XXXA Fall from bed, initial encounter: Secondary | ICD-10-CM | POA: Diagnosis present

## 2018-07-21 DIAGNOSIS — I5030 Unspecified diastolic (congestive) heart failure: Secondary | ICD-10-CM | POA: Diagnosis present

## 2018-07-21 DIAGNOSIS — L89312 Pressure ulcer of right buttock, stage 2: Secondary | ICD-10-CM | POA: Diagnosis present

## 2018-07-21 DIAGNOSIS — F319 Bipolar disorder, unspecified: Secondary | ICD-10-CM | POA: Diagnosis present

## 2018-07-21 DIAGNOSIS — E785 Hyperlipidemia, unspecified: Secondary | ICD-10-CM | POA: Diagnosis present

## 2018-07-21 DIAGNOSIS — M25571 Pain in right ankle and joints of right foot: Secondary | ICD-10-CM | POA: Diagnosis present

## 2018-07-21 DIAGNOSIS — E869 Volume depletion, unspecified: Secondary | ICD-10-CM | POA: Diagnosis present

## 2018-07-21 DIAGNOSIS — I959 Hypotension, unspecified: Secondary | ICD-10-CM | POA: Diagnosis present

## 2018-07-21 DIAGNOSIS — R0902 Hypoxemia: Secondary | ICD-10-CM | POA: Diagnosis present

## 2018-07-21 LAB — BASIC METABOLIC PANEL
Anion gap: 8 (ref 5–15)
BUN: 17 mg/dL (ref 8–23)
CO2: 30 mmol/L (ref 22–32)
Calcium: 8.1 mg/dL — ABNORMAL LOW (ref 8.9–10.3)
Chloride: 94 mmol/L — ABNORMAL LOW (ref 98–111)
Creatinine, Ser: 1.26 mg/dL — ABNORMAL HIGH (ref 0.61–1.24)
GFR calc Af Amer: >60 mL/min (ref >60)
GFR calc non Af Amer: 59 mL/min — ABNORMAL LOW (ref >60)
Glucose, Bld: 76 mg/dL (ref 70–99)
Potassium: 4.5 mmol/L (ref 3.5–5.1)
Sodium: 132 mmol/L — ABNORMAL LOW (ref 135–145)

## 2018-07-21 LAB — URINE CULTURE

## 2018-07-21 MED ORDER — GERHARDT'S BUTT CREAM
TOPICAL_CREAM | Freq: Two times a day (BID) | CUTANEOUS | Status: DC
  Administered 2018-07-21: 1 via TOPICAL
  Administered 2018-07-21 – 2018-07-22 (×2): via TOPICAL
  Filled 2018-07-21: qty 1

## 2018-07-21 MED ORDER — ADULT MULTIVITAMIN W/MINERALS CH
1.0000 | ORAL_TABLET | Freq: Every day | ORAL | Status: DC
  Administered 2018-07-21 – 2018-07-22 (×2): 1 via ORAL
  Filled 2018-07-21 (×2): qty 1

## 2018-07-21 MED ORDER — JUVEN PO PACK
1.0000 | PACK | Freq: Two times a day (BID) | ORAL | Status: DC
  Administered 2018-07-21 – 2018-07-22 (×3): 1 via ORAL
  Filled 2018-07-21 (×4): qty 1

## 2018-07-21 MED ORDER — LORAZEPAM 0.5 MG PO TABS
0.5000 mg | ORAL_TABLET | Freq: Once | ORAL | Status: AC
  Administered 2018-07-21: 0.5 mg via ORAL
  Filled 2018-07-21: qty 1

## 2018-07-21 NOTE — Consult Note (Signed)
[facility] Nurse wound consult note Reason for Consult: Right hip and right buttocks moisture associated skin damage Wound type:Moisture and pressure to hip and buttocks Chronic skin changes to bilateral lower legs Pressure Injury POA: Yes Measurement:3 lesions to right buttock near gluteal fold:  1.5 cm round, each- peeling epithelium Right hip: 1 cmx 1 cm x 0.1 cm  Wound NLW:HKNZ and moist Drainage (amount, consistency, odor) minimal serosanguinous  No odor Periwound:Dry skin Dressing procedure/placement/frequency:Cleanse buttocks and right hip wounds with soap and water and pat dry.  Apply Gerhardts butt paste twice daily and PRN soilage.  OPen to air.  Cleanse lower legs with soap and water and pat dry. Moisturize daily.  Will not follow at this time.  Please re-consult if needed.  Domenic Moras MSN, RN, FNP-BC [facility] Wound, Ostomy, Continence Nurse Pager [phone]

## 2018-07-21 NOTE — Evaluation (Signed)
Physical Therapy Evaluation Patient Details Name: Kent Bryant MRN: [id number] DOB: 1950-11-24 Today's Date: 07/21/2018   History of Present Illness  Kent Bryant is a 68 y.o. male with medical history significant for liver cirrhosis, COPD, bipolar disorder, hemophilia, constrictive pericarditis and heart failure, hypertension.  Patient was brought to the ED via EMS with reports of patient falling off his bed.  Patient tells me he was sitting up, fell asleep while sitting up and fell forward.  Fall was witnessed by his sister.  Patient denies chest pain or difficulty breathing.  He tells me he just slept off.  He did not pass out.He denies vomiting or loose stools, he reports that he normally has good p.o. intake but has not eaten since yesterday.  He has chronic lower extremity redness, this is unchanged.  He has no pain in his lower extremities.EMS gave 500 mill bolus in route.    Clinical Impression  Patient agreeable for therapy, demonstrates slightly labored movement for sitting up at bedside and c/o moderate lightheadedness upon sitting up, standing that resolved after a few minutes, and orthostatic BP's as follows: supine 112/66, sitting 106/64, and standing 95/60.  Patient ambulated in room and hallway without loss of balance while on room with SpO2 between 91-95% and tolerated sitting up in chair after therapy - RN/NT notified.  Patient will benefit from continued physical therapy in hospital and recommended venue below to increase strength, balance, endurance for safe ADLs and gait.  Patient will benefit from continued physical therapy in hospital and recommended venue below to increase strength, balance, endurance for safe ADLs and gait.    Follow Up Recommendations Home health PT;Supervision - Intermittent;Supervision for mobility/OOB    Equipment Recommendations  None recommended by PT    Recommendations for Other Services       Precautions / Restrictions Precautions Precautions:  Fall Restrictions Weight Bearing Restrictions: No      Mobility  Bed Mobility Overal bed mobility: Needs Assistance Bed Mobility: Supine to Sit     Supine to sit: Min guard     General bed mobility comments: slightly labored slow movement  Transfers Overall transfer level: Needs assistance Equipment used: Rolling walker (2 wheeled) Transfers: Sit to/from [facility] Sit to Stand: Min guard Stand pivot transfers: Min guard       General transfer comment: slightly labored slow movement  Ambulation/Gait Ambulation/Gait assistance: Min guard Gait Distance (Feet): 45 Feet Assistive device: Rolling walker (2 wheeled) Gait Pattern/deviations: Decreased step length - right;Decreased step length - left;Decreased stride length Gait velocity: decreased   General Gait Details: slightly labored slow cadence without loss of balance, limited mostly due to c/o fatigue  Stairs            Wheelchair Mobility    Modified Rankin (Stroke Patients Only)       Balance Overall balance assessment: Needs assistance Sitting-balance support: Feet supported;No upper extremity supported Sitting balance-Leahy Scale: Good Sitting balance - Comments: seated at bedside   Standing balance support: During functional activity;Bilateral upper extremity supported Standing balance-Leahy Scale: Fair Standing balance comment: using RW                             Pertinent Vitals/Pain Pain Assessment: No/denies pain    Home Living Family/patient expects to be discharged to:: Private residence Living Arrangements: Other relatives Available Help at Discharge: Family Type of Home: House Home Access: Stairs to enter Entrance Stairs-Rails: Right Entrance Stairs-Number  of Steps: 3 Home Layout: One level Home Equipment: [place] - 2 wheels;Cane - single point;Shower seat;Bedside commode      Prior Function Level of Independence: Independent with assistive  device(s)         Comments: household and short distanced community ambulator with RW     Hand Dominance        Extremity/Trunk Assessment   Upper Extremity Assessment Upper Extremity Assessment: Generalized weakness    Lower Extremity Assessment Lower Extremity Assessment: Generalized weakness    Cervical / Trunk Assessment Cervical / Trunk Assessment: Normal  Communication   Communication: No difficulties  Cognition Arousal/Alertness: Awake/alert Behavior During Therapy: WFL for tasks assessed/performed Overall Cognitive Status: Within Functional Limits for tasks assessed                                        General Comments      Exercises     Assessment/Plan    PT Assessment Patient needs continued PT services  PT Problem List Decreased strength;Decreased activity tolerance;Decreased balance;Decreased mobility       PT Treatment Interventions Gait training;Stair training;Functional mobility training;Therapeutic activities;Therapeutic exercise;Patient/family education    PT Goals (Current goals can be found in the Care Plan section)  Acute Rehab PT Goals Patient Stated Goal: return home with family to assist PT Goal Formulation: With patient Time For Goal Achievement: 07/24/18 Potential to Achieve Goals: Good    Frequency Min 3X/week   Barriers to discharge        Co-evaluation               AM-PAC PT "6 Clicks" Mobility  Outcome Measure Help needed turning from your back to your side while in a flat bed without using bedrails?: None Help needed moving from lying on your back to sitting on the side of a flat bed without using bedrails?: A Little Help needed moving to and from a bed to a chair (including a wheelchair)?: A Little Help needed standing up from a chair using your arms (e.g., wheelchair or bedside chair)?: A Little Help needed to walk in hospital room?: A Little Help needed climbing 3-5 steps with a railing? :  A Lot 6 Click Score: 18    End of Session   Activity Tolerance: Patient tolerated treatment well;Patient limited by fatigue Patient left: in chair;with call bell/phone within reach;with chair alarm set Nurse Communication: Mobility status PT Visit Diagnosis: Unsteadiness on feet (R26.81);Other abnormalities of gait and mobility (R26.89);Muscle weakness (generalized) (M62.81)    Time: 4709-6283 PT Time Calculation (min) (ACUTE ONLY): 28 min   Charges:   PT Evaluation $PT Eval Moderate Complexity: 1 Mod PT Treatments $Therapeutic Activity: 23-37 mins        2:46 PM, 07/21/18 Lonell Grandchild, MPT Physical Therapist with [facility] [phone] office [phone] mobile phone

## 2018-07-21 NOTE — Telephone Encounter (Signed)
Patient sister call requesting to speak with the nurse as well as cancel the schedule hospital appointment on 07/22/2018. She stated that her brother was in the hospital due falling.

## 2018-07-21 NOTE — Progress Notes (Addendum)
Patient Demographics:    Kent Bryant, is a 68 y.o. 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[place])   Anasarca   Constrictive pericarditis   Hypoxia   Cirrhosis of liver (HCC)   (HFpEF) heart failure with preserved ejection fraction [facility])  Brief summary-  68 year old with past medical history relevant for liver cirrhosis, COPD, bipolar disorder, hemophilia, constrictive pericarditis and heart failure, HTN admitted on 07/20/2018 after falling at home and found to have borderline low blood pressure and borderline hypoxia in the setting of dCHF   A/p 1)HFpEF/constrictive pericarditis--- clinically and radiologically patient with CHF flareup however soft BP precludes aggressive diuresis dyspnea with minimal exertion persist, still requiring oxygen, last known EF based on echo from November 2019 was 60 to 65%, --Continue IV Lasix 20 mg every 12 hours, Aldactone 25 mg twice daily --Admission chest x-ray with large right-sided pleural effusion and small left-sided pleural effusion --Repeat chest x-ray on 07/22/2018 if patient remains symptomatic consider therapeutic thoracentesis on the right --Weight is up to 205 pounds from 200 pounds a day ago,  2)s/p Fall--- nasal bone fracture noted,--- PT eval appreciated recommends home health physical therapy  3) Liver Cirrhosis--patient follows with [facility] Dr. Early Osmond, who was initially  planned to have abdominal ultrasound on 07/22/2018 with possible therapeutic paracentesis if enough ascitic fluid is identified--will get the abdominal ultrasound here on 07/22/2018 -Continue lactulose and Aldactone  4) acute hypoxic respiratory failure--- secondary to #1 above, see management as above in #1, may need therapeutic paracentesis on the right if fails to improve with diuresis  5) hemophilia--stable no obvious bleeding  6) depressive disorder--- stable, continue Zoloft  7)right buttock and right hip pressure Injury POA: --Wound care consult appreciated follow recommendations    Disposition/Need for in-Hospital Stay- patient unable to be discharged at this time due to persistent hypoxia despite diuresis probably will need right-sided thoracentesis  Code Status : Full  Family Communication:   NA (patient is alert, awake and coherent) Discussed with sister Kent Bryant  On 07/21/2018  Disposition Plan  : Home with home health  Consults  : May need IR for thoracentesis and paracentesis  DVT Prophylaxis  :   hemophilia/status post fall- use SCDs   Lab Results  Component Value Date   PLT 171 07/20/2018    Inpatient Medications  Scheduled Meds:  atorvastatin  40 mg Oral q1800   feeding supplement (ENSURE ENLIVE)  237 mL Oral BID BM   ferrous sulfate  325 mg Oral BID WC   furosemide  20 mg Intravenous BID   gabapentin  600 mg Oral TID   Gerhardt's butt cream   Topical BID   lactulose  10 g Oral TID   multivitamin with minerals  1 tablet Oral Daily   nutrition supplement (JUVEN)  1 packet Oral BID BM   potassium chloride SA  40 mEq Oral BID   sertraline  50 mg Oral Daily   spironolactone  25 mg Oral BID   Continuous Infusions: PRN Meds:.acetaminophen **OR** acetaminophen, ondansetron **OR** ondansetron (ZOFRAN) IV, polyethylene glycol    Anti-infectives (From admission, onward)   None        Objective:   Vitals:   07/20/18 2021 07/21/18  0500 07/21/18 0508 07/21/18 1319  BP: 105/81  106/69 113/85  Pulse: (!) 121  (!) 117 (!) 123  Resp: (!) 21  19 19   Temp: 98.1 F (36.7 C)  98 F (36.7 C) 98 F (36.7 C)  TempSrc: Oral  Oral   SpO2: 99%  93% 97%  Weight: 92.8 kg 93 kg    Height: 5\' 11"  (1.803 m)       Wt Readings from Last 3 Encounters:  07/21/18 93 kg  07/09/18 95.7 kg  06/16/18 95.3 kg     Intake/Output Summary (Last 24 hours) at 07/21/2018 1848 Last data filed at 07/21/2018 1700 Gross per 24 hour  Intake 720 ml  Output 1101 ml  Net -381 ml     Physical Exam  Gen:- Awake Alert, chronically ill-appearing HEENT:- [place].AT, No sclera icterus Neck-Supple Neck,No JVD,.  Nose- [place] 2 L/min Lungs-diminished in bases right more than left, no wheezing has scattered rhonchi on the right CV- S1, S2 normal, regular  Abd-  +ve B.Sounds, Abd Soft, slightly distended but not tender Extremity/Skin:-Please see pictures in epic Psych-affect is appropriate, oriented x3 Neuro-generalized weakness, no new focal deficits, no tremors Skin--right buttock and right hip pressure injury-- Measurement:3 lesions to right buttock near gluteal fold:  1.5 cm round, each- peeling epithelium, Right hip: 1 cmx 1 cm x 0.1 cm --Please see skin pictures in epic  Data Review:   Micro Results Recent Results (from the past 240 hour(s))  Blood culture (routine x 2)     Status: None (Preliminary result)   Collection Time: 07/20/18  1:12 PM   Specimen: BLOOD  Result Value Ref Range Status   Specimen Description BLOOD  Final   Special Requests   Final    BOTTLES DRAWN AEROBIC ONLY Blood Culture results may not be optimal due to an inadequate volume of blood received in culture bottles   Culture   Final    NO GROWTH < 24 HOURS Performed at [facility], [address] [place], [place] [address]    Report Status PENDING  Incomplete  Blood culture (routine x 2)     Status: None (Preliminary result)   Collection Time: 07/20/18  1:13 PM    Specimen: BLOOD LEFT HAND  Result Value Ref Range Status   Specimen Description BLOOD LEFT HAND  Final   Special Requests   Final    BOTTLES DRAWN AEROBIC AND ANAEROBIC Blood Culture results may not be optimal due to an inadequate volume of blood received in culture bottles   Culture   Final    NO GROWTH < 24 HOURS Performed at [facility], [address], [place], [place] [address]    Report Status PENDING  Incomplete  SARS Coronavirus 2 (CEPHEID- Performed in [place] hospital lab), Hosp Order     Status: None   Collection Time: 07/20/18  1:17 PM   Specimen: Nasopharyngeal Swab  Result Value Ref Range Status   SARS  Coronavirus 2 NEGATIVE NEGATIVE Final    Comment: (NOTE) If result is NEGATIVE SARS-CoV-2 target nucleic acids are NOT DETECTED. The SARS-CoV-2 RNA is generally detectable in upper and lower  respiratory specimens during the acute phase of infection. The lowest  concentration of SARS-CoV-2 viral copies this assay can detect is 250  copies / mL. A negative result does not preclude SARS-CoV-2 infection  and should not be used as the sole basis for treatment or other  patient management decisions.  A negative result may occur with  improper specimen collection / handling, submission of specimen other  than nasopharyngeal swab, presence of viral mutation(s) within the  areas targeted by this assay, and inadequate number of viral copies  (<250 copies / mL). A negative result must be combined with clinical  observations, patient history, and epidemiological information. If result is POSITIVE SARS-CoV-2 target nucleic acids are DETECTED. The SARS-CoV-2 RNA is generally detectable in upper and lower  respiratory specimens dur ing the acute phase of infection.  Positive  results are indicative of active infection with SARS-CoV-2.  Clinical  correlation with patient history and other diagnostic information is  necessary to determine patient infection status.  Positive  results do  not rule out bacterial infection or co-infection with other viruses. If result is PRESUMPTIVE POSTIVE SARS-CoV-2 nucleic acids MAY BE PRESENT.   A presumptive positive result was obtained on the submitted specimen  and confirmed on repeat testing.  While 2019 novel coronavirus  (SARS-CoV-2) nucleic acids may be present in the submitted sample  additional confirmatory testing may be necessary for epidemiological  and / or clinical management purposes  to differentiate between  SARS-CoV-2 and other Sarbecovirus currently known to infect humans.  If clinically indicated additional testing with an alternate test  methodology [phone]) is advised. The SARS-CoV-2 RNA is generally  detectable in upper and lower respiratory sp ecimens during the acute  phase of infection. The expected result is Negative. Fact Sheet for Patients:  StrictlyIdeas.no Fact Sheet for Healthcare Providers: BankingDealers.co.za This test is not yet approved or cleared by the [place] FDA and has been authorized for detection and/or diagnosis of SARS-CoV-2 by FDA under an Emergency Use Authorization (EUA).  This EUA will remain in effect (meaning this test can be used) for the duration of the COVID-19 declaration under Section 564(b)(1) of the Act, 21 U.S.C. section 360bbb-3(b)(1), unless the authorization is terminated or revoked sooner. Performed at [facility], [address], [place], [place] [address]     Radiology Reports Dg Ankle Complete Right  Result Date: 07/20/2018 CLINICAL DATA:  Recent fall with pain, initial encounter EXAM: RIGHT ANKLE - COMPLETE 3+ VIEW COMPARISON:  None. FINDINGS: There is no evidence of fracture, dislocation, or joint effusion. There is no evidence of arthropathy or other focal bone abnormality. Soft tissues are unremarkable. IMPRESSION: No acute abnormality noted. Electronically Signed   By: Inez Catalina M.D.   On:  07/20/2018 14:11   Ct Head Wo Contrast  Result Date: 07/20/2018 CLINICAL DATA:  Patient fell off of his bed. Trauma to the face. EXAM: CT HEAD WITHOUT CONTRAST CT MAXILLOFACIAL WITHOUT CONTRAST CT CERVICAL SPINE WITHOUT CONTRAST TECHNIQUE: Multidetector CT imaging of the head, cervical spine, and maxillofacial structures were performed using the standard protocol without intravenous contrast. Multiplanar CT image reconstructions of the cervical spine and maxillofacial structures were also generated. COMPARISON:  None. FINDINGS: CT HEAD FINDINGS Brain: No evidence of acute infarction, hemorrhage, hydrocephalus, extra-axial collection or mass lesion/mass effect. Mild  brain parenchymal volume loss and deep white matter microangiopathy. Vascular: No hyperdense vessel or unexpected calcification. Skull: Normal. Negative for fracture or focal lesion. Other: None. CT MAXILLOFACIAL FINDINGS Osseous: Minimally comminuted and displaced fracture of the tip of the nasal bones. Orbits: Negative. No traumatic or inflammatory finding. Sinuses: Clear. Soft tissues: Soft tissues allowing of the nose. CT CERVICAL SPINE FINDINGS Alignment: Reversal of cervical lordosis. Skull base and vertebrae: No acute fracture. No primary bone lesion or focal pathologic process. Superior endplate compression deformity of T1 and T2 vertebral bodies, likely degenerative. Soft tissues and spinal canal: No prevertebral fluid or swelling. No visible canal hematoma. Disc levels:  Multilevel osteoarthritic changes. Upper chest: Bilateral pleural effusions. Other: None. IMPRESSION: 1. No acute intracranial abnormality. 2. Atrophy, chronic microvascular disease. 3. Minimally comminuted and displaced fracture of the tip of the nasal bones. 4. No evidence of acute traumatic injury to the cervical spine. 5. Multilevel osteoarthritic changes of the cervical spine. 6. Superior endplate compression deformity of T1 and T2 vertebral bodies, likely degenerative.  7. Bilateral pleural effusions. Electronically Signed   By: Fidela Salisbury M.D.   On: 07/20/2018 15:06   Ct Cervical Spine Wo Contrast  Result Date: 07/20/2018 CLINICAL DATA:  Patient fell off of his bed. Trauma to the face. EXAM: CT HEAD WITHOUT CONTRAST CT MAXILLOFACIAL WITHOUT CONTRAST CT CERVICAL SPINE WITHOUT CONTRAST TECHNIQUE: Multidetector CT imaging of the head, cervical spine, and maxillofacial structures were performed using the standard protocol without intravenous contrast. Multiplanar CT image reconstructions of the cervical spine and maxillofacial structures were also generated. COMPARISON:  None. FINDINGS: CT HEAD FINDINGS Brain: No evidence of acute infarction, hemorrhage, hydrocephalus, extra-axial collection or mass lesion/mass effect. Mild brain parenchymal volume loss and deep white matter microangiopathy. Vascular: No hyperdense vessel or unexpected calcification. Skull: Normal. Negative for fracture or focal lesion. Other: None. CT MAXILLOFACIAL FINDINGS Osseous: Minimally comminuted and displaced fracture of the tip of the nasal bones. Orbits: Negative. No traumatic or inflammatory finding. Sinuses: Clear. Soft tissues: Soft tissues allowing of the nose. CT CERVICAL SPINE FINDINGS Alignment: Reversal of cervical lordosis. Skull base and vertebrae: No acute fracture. No primary bone lesion or focal pathologic process. Superior endplate compression deformity of T1 and T2 vertebral bodies, likely degenerative. Soft tissues and spinal canal: No prevertebral fluid or swelling. No visible canal hematoma. Disc levels:  Multilevel osteoarthritic changes. Upper chest: Bilateral pleural effusions. Other: None. IMPRESSION: 1. No acute intracranial abnormality. 2. Atrophy, chronic microvascular disease. 3. Minimally comminuted and displaced fracture of the tip of the nasal bones. 4. No evidence of acute traumatic injury to the cervical spine. 5. Multilevel osteoarthritic changes of the cervical  spine. 6. Superior endplate compression deformity of T1 and T2 vertebral bodies, likely degenerative. 7. Bilateral pleural effusions. Electronically Signed   By: Fidela Salisbury M.D.   On: 07/20/2018 15:06   Dg Chest Portable 1 View  Result Date: 07/20/2018 CLINICAL DATA:  68 year old with acute hypoxia. Patient was sitting on the side of her bed this morning and fell asleep and fell out of bed. Abrasion to the nose. Initial encounter. EXAM: PORTABLE CHEST 1 VIEW COMPARISON:  07/22/2017 and earlier. FINDINGS: Cardiac silhouette moderately to markedly enlarged, unchanged. Mild to moderate diffuse interstitial pulmonary edema. Large RIGHT pleural effusion and small LEFT pleural effusion. Associated consolidation in the RIGHT LOWER LOBE and RIGHT MIDDLE LOBE. IMPRESSION: 1. Mild-to-moderate CHF, with stable moderate to marked cardiomegaly and mild to moderate diffuse interstitial pulmonary edema. 2. Large RIGHT pleural effusion  and small LEFT pleural effusion. 3. Associated passive atelectasis involving the RIGHT MIDDLE LOBE and RIGHT LOWER LOBE (favored over pneumonia). Electronically Signed   By: Evangeline Dakin M.D.   On: 07/20/2018 14:17   Dg Foot Complete Right  Result Date: 07/20/2018 CLINICAL DATA:  Recent fall with foot pain, initial encounter EXAM: RIGHT FOOT COMPLETE - 3+ VIEW COMPARISON:  None. FINDINGS: There is no evidence of fracture or dislocation. There is no evidence of arthropathy or other focal bone abnormality. Soft tissues are unremarkable. IMPRESSION: No acute abnormality noted Electronically Signed   By: Inez Catalina M.D.   On: 07/20/2018 14:11   Ct Maxillofacial Wo Contrast  Result Date: 07/20/2018 CLINICAL DATA:  Patient fell off of his bed. Trauma to the face. EXAM: CT HEAD WITHOUT CONTRAST CT MAXILLOFACIAL WITHOUT CONTRAST CT CERVICAL SPINE WITHOUT CONTRAST TECHNIQUE: Multidetector CT imaging of the head, cervical spine, and maxillofacial structures were performed using the  standard protocol without intravenous contrast. Multiplanar CT image reconstructions of the cervical spine and maxillofacial structures were also generated. COMPARISON:  None. FINDINGS: CT HEAD FINDINGS Brain: No evidence of acute infarction, hemorrhage, hydrocephalus, extra-axial collection or mass lesion/mass effect. Mild brain parenchymal volume loss and deep white matter microangiopathy. Vascular: No hyperdense vessel or unexpected calcification. Skull: Normal. Negative for fracture or focal lesion. Other: None. CT MAXILLOFACIAL FINDINGS Osseous: Minimally comminuted and displaced fracture of the tip of the nasal bones. Orbits: Negative. No traumatic or inflammatory finding. Sinuses: Clear. Soft tissues: Soft tissues allowing of the nose. CT CERVICAL SPINE FINDINGS Alignment: Reversal of cervical lordosis. Skull base and vertebrae: No acute fracture. No primary bone lesion or focal pathologic process. Superior endplate compression deformity of T1 and T2 vertebral bodies, likely degenerative. Soft tissues and spinal canal: No prevertebral fluid or swelling. No visible canal hematoma. Disc levels:  Multilevel osteoarthritic changes. Upper chest: Bilateral pleural effusions. Other: None. IMPRESSION: 1. No acute intracranial abnormality. 2. Atrophy, chronic microvascular disease. 3. Minimally comminuted and displaced fracture of the tip of the nasal bones. 4. No evidence of acute traumatic injury to the cervical spine. 5. Multilevel osteoarthritic changes of the cervical spine. 6. Superior endplate compression deformity of T1 and T2 vertebral bodies, likely degenerative. 7. Bilateral pleural effusions. Electronically Signed   By: Fidela Salisbury M.D.   On: 07/20/2018 15:06     CBC Recent Labs  Lab 07/20/18 1310  WBC 9.1  HGB 11.7*  HCT 36.1*  PLT 171  MCV 95.8  MCH 31.0  MCHC 32.4  RDW 18.0*  LYMPHSABS 1.3  MONOABS 1.2*  EOSABS 0.1  BASOSABS 0.1    Chemistries  Recent Labs  Lab  07/20/18 1310 07/21/18 0430  NA 128* 132*  K 4.5 4.5  CL 92* 94*  CO2 27 30  GLUCOSE 83 76  BUN 16 17  CREATININE 1.24 1.26*  CALCIUM 7.8* 8.1*  AST 47*  --   ALT 32  --   ALKPHOS 137*  --   BILITOT 1.4*  --    ------------------------------------------------------------------------------------------------------------------ No results for input(s): CHOL, HDL, LDLCALC, TRIG, CHOLHDL, LDLDIRECT in the last 72 hours.  Lab Results  Component Value Date   HGBA1C 5.9 08/30/2016   ------------------------------------------------------------------------------------------------------------------ No results for input(s): TSH, T4TOTAL, T3FREE, THYROIDAB in the last 72 hours.  Invalid input(s): FREET3 ------------------------------------------------------------------------------------------------------------------ No results for input(s): VITAMINB12, FOLATE, FERRITIN, TIBC, IRON, RETICCTPCT in the last 72 hours.  Coagulation profile No results for input(s): INR, PROTIME in the last 168 hours.  No results for  input(s): DDIMER in the last 72 hours.  Cardiac Enzymes No results for input(s): CKMB, TROPONINI, MYOGLOBIN in the last 168 hours.  Invalid input(s): CK ------------------------------------------------------------------------------------------------------------------    Component Value Date/Time   BNP 162.0 (H) 07/20/2018 1310     Roxan Hockey M.D on 07/21/2018 at 6:48 PM  Go to [email or web address] - for contact info  [facility] - Office  [phone]

## 2018-07-21 NOTE — Care Management Obs Status (Signed)
[place] NOTIFICATION   Patient Details  Name: Naythen Heikkila MRN: [id number] Date of Birth: [birth year]   Medicare Observation Status Notification Given:  Yes    Ashyra Cantin, Chauncey Reading, RN 07/21/2018, 1:35 PM

## 2018-07-21 NOTE — Telephone Encounter (Signed)
The pt appt for 7/15 [place] has been cancelled and the pt's sister will see if this can be completed while admitted at [facility] for fall.

## 2018-07-21 NOTE — TOC Initial Note (Addendum)
Transition of Care [facility]) - Initial/Assessment Note    Patient Details  Name: Kent Bryant MRN: [id number] Date of Birth: 01/26/1950  Transition of Care [facility]) CM/SW Contact:    Remas Sobel, Chauncey Reading, RN Phone Number: 07/21/2018, 1:53 PM  Clinical Narrative:   Fall, CHF. From home with sister. Reports independence. Walks with RW. Active with [facility]. Seen by PT, recommended to continue home health. Patient agreeable. Will update Tim of [facility] when discharging, will need resumption orders.  Patient has PCP, sister drives him to appointments, no issues obtaining medications.        Patient on room air during assessment.          Expected Discharge Plan: [place] Barriers to Discharge: No Barriers Identified   Patient Goals and CMS Choice Patient states their goals for this hospitalization and ongoing recovery are:: return home      Expected Discharge Plan and Services Expected Discharge Plan: [place] Acute Care Choice: [place], Resumption of Svcs/PTA Provider                             HH Arranged: PT, RN [facility] Agency: [facility] (now [facility]) Date [place]: 07/21/18 Time [place]: 38 Representative spoke with at [place]: Tim  Prior Living Arrangements/Services   Lives with:: Siblings Patient language and need for interpreter reviewed:: Yes Do you feel safe going back to the place where you live?: Yes      Need for Family Participation in Patient Care: Yes (Comment) Care giver support system in place?: Yes (comment) Current home services: DME(RW) Criminal Activity/Legal Involvement Pertinent to Current Situation/Hospitalization: No - Comment as needed  Activities of Daily Living Home Assistive Devices/Equipment: Walker (specify type) ADL Screening (condition at time of admission) Patient's cognitive ability adequate to safely complete daily activities?: Yes Is the  patient deaf or have difficulty hearing?: Yes Does the patient have difficulty seeing, even when wearing glasses/contacts?: No Does the patient have difficulty concentrating, remembering, or making decisions?: Yes Patient able to express need for assistance with ADLs?: Yes Does the patient have difficulty dressing or bathing?: Yes Independently performs ADLs?: No Communication: Independent Dressing (OT): Needs assistance Is this a change from baseline?: Change from baseline, expected to last <3days Grooming: Needs assistance Is this a change from baseline?: Change from baseline, expected to last <3 days Feeding: Independent Bathing: Needs assistance Is this a change from baseline?: Change from baseline, expected to last <3 days Toileting: Needs assistance Is this a change from baseline?: Change from baseline, expected to last <3 days In/Out Bed: Needs assistance Is this a change from baseline?: Change from baseline, expected to last <3 days Walks in Home: Independent with device (comment) Does the patient have difficulty walking or climbing stairs?: Yes Weakness of Legs: Both Weakness of Arms/Hands: None  Permission Sought/Granted Permission sought to share information with : Other (comment) Permission granted to share information with : Yes, Verbal Permission Granted     Permission granted to share info w AGENCY: [facility]        Emotional Assessment Appearance:: Appears stated age Attitude/Demeanor/Rapport: Engaged Affect (typically observed): Accepting Orientation: : Oriented to Self, Oriented to Place, Oriented to  Time Alcohol / Substance Use: Not Applicable Psych Involvement: No (comment)  Admission diagnosis:  Hypoxia [R09.02] Pleural effusion, bilateral [J90] Generalized weakness [R53.1] Acute  on chronic congestive heart failure, unspecified heart failure type [facility]) [I50.9] Patient Active Problem List   Diagnosis Date Noted  . Hypoxia 07/20/2018  . Anemia  07/08/2018  . Cirrhosis of liver without ascites ([place]) 11/05/2017  . Portal hypertension ([place]) 11/05/2017  . Hepatomegaly 09/21/2017  . Blood alkaline phosphatase increased compared with prior measurement 09/21/2017  . Hyperbilirubinemia 08/18/2017  . Anasarca 07/24/2017  . 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  . COPD (chronic obstructive pulmonary disease) ([place]) 07/22/2017  . 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 Bipolar disorder ([place])   . Chronic low back pain   . Peripheral edema 03/03/2014  . Insomnia 03/03/2014  . Hypokalemia 10/21/2013  . BMI 37.0-37.9, adult 06/28/2013  . Major depression in remission ([place]) 12/16/2012  . GAD (generalized anxiety disorder) 12/16/2012  . Peripheral neuropathy, idiopathic 12/16/2012  . Hyperlipidemia with target LDL less than 130 12/16/2012   PCP:  Dettinger, Fransisca Kaufmann, MD Pharmacy:   [id number] - [place], [address] [place] [address] Phone: [phone] Fax: [phone]     Social Determinants of Health (SDOH) Interventions    Readmission Risk Interventions No flowsheet data found.

## 2018-07-21 NOTE — Plan of Care (Signed)
  Problem: Acute Rehab PT Goals(only PT should resolve) Goal: Pt Will Go Supine/Side To Sit Outcome: Progressing Flowsheets (Taken 07/21/2018 1448) Pt will go Supine/Side to Sit: with modified independence Goal: Patient Will Transfer Sit To/From Stand Outcome: Progressing Flowsheets (Taken 07/21/2018 1448) Patient will transfer sit to/from stand: with supervision Goal: Pt Will Transfer Bed To Chair/Chair To Bed Outcome: Progressing Flowsheets (Taken 07/21/2018 1448) Pt will Transfer Bed to Chair/Chair to Bed: with supervision Goal: Pt Will Ambulate 07/21/2018 1449 by Lonell Grandchild, PT Flowsheets (Taken 07/21/2018 1449) Pt will Ambulate:  50 feet  with supervision  with rolling walker 07/21/2018 1448 by Lonell Grandchild, PT Outcome: Progressing Flowsheets (Taken 07/21/2018 1448) Pt will Ambulate:  50 feet  with supervision  with rolling walker   2:50 PM, 07/21/18 Lonell Grandchild, MPT Physical Therapist with [facility] 336 [phone] office [phone] mobile phone

## 2018-07-21 NOTE — Progress Notes (Signed)
Initial Nutrition Assessment  DOCUMENTATION CODES:      INTERVENTION:  -1 packet Juven BID, each packet provides 95 calories, 2.5 grams of protein (collagen), and 9.8 grams of carbohydrate (3 grams sugar); also contains 7 grams of L-arginine and L-glutamine, 300 mg vitamin C, 15 mg vitamin E, 1.2 mcg vitamin B-12, 9.5 mg zinc, 200 mg calcium, and 1.5 g  Calcium Beta-hydroxy-Beta-methylbutyrate to support wound healing   -Ensure Enlive po BID, each supplement provides 350 kcal and 20 grams of protein    NUTRITION DIAGNOSIS:   Increased nutrient needs related to wound healing as evidenced by estimated needs.   GOAL:   Patient will meet greater than or equal to 90% of their needs  MONITOR:   PO intake, Supplement acceptance, Labs, Weight trends, Skin  REASON FOR ASSESSMENT:   Malnutrition Screening Tool    ASSESSMENT: Patient is a 68 yo male with history of cirrhosis, COPD, HF, Vitamin D deficency. Presents from home following a witnessed fall. Ambulates with a walker at home. Patient has chronic status changes per MD lower extremities. Stage 2 to right buttock.  Meal intake: 50% and he is receiving Ensure Enlive BID. His lunch tray is here observed intake. Patient lives with his sister and endorses eating pattern of three meals daily. Eggs or cereal at breakfast, sandwich for lunch and in the evening she cooks of picks up a plate from local cafe. Patient denies recent changes in appetite and follows a no added salt diet at home.  Patient current meal intake is NOT adequate to meet est needs noted below. Will add ONS to support increased protein and energy and consumption of essential nutrients. He is at high risk for malnutrition given he is likely meeting <50% of his daily est needs at this time.    Review of weigh history shows trend downward- usual range 95-100 kg.   Labs reviewed: Cr 1.26 (H), Sodium 132 (L)  Medications reviewed and include: Lasix, KCL, Iron,  Lactulose  NUTRITION - FOCUSED PHYSICAL EXAM:  Unable to complete Nutrition-Focused physical exam at this time.    Diet Order:   Diet Order            Diet Heart Room service appropriate? Yes; Fluid consistency: Thin  Diet effective now              EDUCATION NEEDS:   Education needs have been addressed  Skin:   stage II right buttock and chronic stasis bilateral lower extremities.  Last BM:  7/14 medium type 4  Height:   Ht Readings from Last 1 Encounters:  07/20/18 5\' 11"  (1.803 m)    Weight:   Wt Readings from Last 1 Encounters:  07/21/18 93 kg  Daily weight montioring   Ideal Body Weight:  78 kg  BMI:  Body mass index is 28.6 kg/m.  Estimated Nutritional Needs:   Kcal:  6045-4098 (MSJ x 1.2-1.3 AF)  Protein:  101-109 gr  Fluid:  2 liters daily   Colman Cater MS,RD,CSG,LDN Office: [phone] Pager: [phone]

## 2018-07-22 ENCOUNTER — Ambulatory Visit (HOSPITAL_COMMUNITY): Payer: 59

## 2018-07-22 ENCOUNTER — Inpatient Hospital Stay (HOSPITAL_COMMUNITY): Payer: 59

## 2018-07-22 ENCOUNTER — Telehealth: Payer: Self-pay | Admitting: Family Medicine

## 2018-07-22 ENCOUNTER — Other Ambulatory Visit: Payer: Self-pay | Admitting: *Deleted

## 2018-07-22 DIAGNOSIS — J9601 Acute respiratory failure with hypoxia: Secondary | ICD-10-CM

## 2018-07-22 LAB — HIV ANTIBODY (ROUTINE TESTING W REFLEX): HIV Screen 4th Generation wRfx: NONREACTIVE

## 2018-07-22 MED ORDER — ENSURE ENLIVE PO LIQD: 237.0000 mL | Freq: Two times a day (BID) | ORAL | 12 refills | Status: AC

## 2018-07-22 MED ORDER — FUROSEMIDE 10 MG/ML IJ SOLN
80.0000 mg | Freq: Two times a day (BID) | INTRAMUSCULAR | Status: DC
  Administered 2018-07-22: 80 mg via INTRAVENOUS
  Filled 2018-07-22: qty 8

## 2018-07-22 MED ORDER — GERHARDT'S BUTT CREAM: 1.0000 "application " | TOPICAL_CREAM | Freq: Two times a day (BID) | CUTANEOUS | 3 refills | Status: AC

## 2018-07-22 NOTE — Telephone Encounter (Signed)
Go ahead and do an order for the prescription for the lift chair, diagnosis right buttock pressure sore, wheelchair-bound, liver cirrhosis

## 2018-07-22 NOTE — Progress Notes (Signed)
Physical Therapy Treatment Patient Details Name: Kent Bryant MRN: [id number] DOB: [birth year] Today's Date: 07/22/2018    History of Present Illness Kent Bryant is a 68 y.o. male with medical history significant for liver cirrhosis, COPD, bipolar disorder, hemophilia, constrictive pericarditis and heart failure, hypertension.  Patient was brought to the ED via EMS with reports of patient falling off his bed.  Patient tells me he was sitting up, fell asleep while sitting up and fell forward.  Fall was witnessed by his sister.  Patient denies chest pain or difficulty breathing.  He tells me he just slept off.  He did not pass out.He denies vomiting or loose stools, he reports that he normally has good p.o. intake but has not eaten since yesterday.  He has chronic lower extremity redness, this is unchanged.  He has no pain in his lower extremities.EMS gave 500 mill bolus in route.    PT Comments    Patient agreeable for therapy and demonstrates increased endurance/distance for gait training without loss of balance, on room air with SpO2 between 91-93%, requiring sitting rest break before ambulating back to room, demonstrated good return for transferring to commode in bathroom and later transferred to gurney to be taken to a procedure.  Patient will benefit from continued physical therapy in hospital and recommended venue below to increase strength, balance, endurance for safe ADLs and gait.   Follow Up Recommendations  Home health PT;Supervision - Intermittent;Supervision for mobility/OOB     Equipment Recommendations  None recommended by PT    Recommendations for Other Services       Precautions / Restrictions Precautions Precautions: Fall Restrictions Weight Bearing Restrictions: No    Mobility  Bed Mobility Overal bed mobility: Needs Assistance Bed Mobility: Supine to Sit     Supine to sit: Supervision     General bed mobility comments: slightly labored slow  movement  Transfers Overall transfer level: Needs assistance Equipment used: Rolling walker (2 wheeled);None Transfers: Sit to/from [employer] to Stand: Supervision;Modified independent (Device/Increase time) Stand pivot transfers: Supervision       General transfer comment: requires less assistance for sit to stands and demonstrates fair standing balance without using RW  Ambulation/Gait Ambulation/Gait assistance: Supervision Gait Distance (Feet): 75 Feet Assistive device: Rolling walker (2 wheeled) Gait Pattern/deviations: Decreased step length - right;Decreased step length - left;Decreased stride length Gait velocity: decreased   General Gait Details: increased endurance/distance for ambulation with slow slightly labored cadence, required sitting rest break before walking back to room, on room air with SpO2 at 91-93%   Stairs             Wheelchair Mobility    Modified Rankin (Stroke Patients Only)       Balance Overall balance assessment: Needs assistance Sitting-balance support: Feet supported;No upper extremity supported Sitting balance-Leahy Scale: Normal Sitting balance - Comments: seated at bedside   Standing balance support: During functional activity;No upper extremity supported Standing balance-Leahy Scale: Fair Standing balance comment: fair/good using RW                            Cognition Arousal/Alertness: Awake/alert Behavior During Therapy: WFL for tasks assessed/performed Overall Cognitive Status: Within Functional Limits for tasks assessed                                        Exercises  General Exercises - Lower Extremity Long Arc Quad: Seated;AROM;Strengthening;Both;10 reps Hip Flexion/Marching: Seated;AROM;Strengthening;Both;10 reps Toe Raises: Seated;AROM;Strengthening;Both;10 reps Heel Raises: Seated;AROM;Strengthening;Both;10 reps    General Comments        Pertinent  Vitals/Pain Pain Assessment: No/denies pain    Home Living                      Prior Function            PT Goals (current goals can now be found in the care plan section) Acute Rehab PT Goals Patient Stated Goal: return home with family to assist PT Goal Formulation: With patient Time For Goal Achievement: 07/24/18 Potential to Achieve Goals: Good Progress towards PT goals: Progressing toward goals    Frequency    Min 3X/week      PT Plan Current plan remains appropriate    Co-evaluation              AM-PAC PT "6 Clicks" Mobility   Outcome Measure  Help needed turning from your back to your side while in a flat bed without using bedrails?: None Help needed moving from lying on your back to sitting on the side of a flat bed without using bedrails?: None Help needed moving to and from a bed to a chair (including a wheelchair)?: None Help needed standing up from a chair using your arms (e.g., wheelchair or bedside chair)?: None Help needed to walk in hospital room?: A Little Help needed climbing 3-5 steps with a railing? : A Little 6 Click Score: 22    End of Session   Activity Tolerance: Patient tolerated treatment well;Patient limited by fatigue Patient left: with nursing/sitter in room;Other (comment)(Patient left in gurney to be taken to procedure with transporter present) Nurse Communication: Mobility status PT Visit Diagnosis: Unsteadiness on feet (R26.81);Other abnormalities of gait and mobility (R26.89);Muscle weakness (generalized) (M62.81)     Time: 1020-1051 PT Time Calculation (min) (ACUTE ONLY): 31 min  Charges:  $Gait Training: 8-22 mins $Therapeutic Exercise: 8-22 mins                     12:29 PM, 07/22/18 Kent Bryant, MPT Physical Therapist with [facility] 336 [phone] office [phone] mobile phone

## 2018-07-22 NOTE — Discharge Summary (Signed)
Physician Discharge Summary  Kent Bryant WUJ:[id number] DOB: 04/18/1950 DOA: 07/20/2018  PCP: Dettinger, Fransisca Kaufmann, MD  Admit date: 07/20/2018  Discharge date: 07/22/2018  Admitted From:Home  Disposition:  Home  Recommendations for Outpatient Follow-up:  1. Follow up with PCP in 1-2 weeks 2. Continue on home diuretics as previous 3. No need for home oxygen noted after ambulation  Home Health: Yes with RN and PT  Equipment/Devices: None  Discharge Condition: Stable  CODE STATUS: Full  Diet recommendation: Heart Healthy  Brief/Interim Summary: 68 year old with past medical history relevant for liver cirrhosis, COPD, bipolar disorder, hemophilia, constrictive pericarditis and heart failure, HTN admitted on 07/20/2018 after falling at home and found to have borderline low blood pressure and borderline hypoxia in the setting of dCHF   A/p 1)HFpEF/constrictive pericarditis--- clinically and radiologically patient with CHF flareup however soft BP precludes aggressive diuresis dyspnea with minimal exertion persist, still requiring oxygen, last known EF based on echo from November 2019 was 60 to 65%, --Continue home torsemide and Aldactone -Patient appears to have had under a liter of net diuresis, but is otherwise doing much better with no need for oxygen.  He no longer has any dyspnea  2)s/p Fall--- nasal bone fracture noted,--- PT eval appreciated recommends home health physical therapy which will be continued at home  3) Liver Cirrhosis--patient follows with [facility] Dr. Early Osmond, who was initially planned to have abdominal ultrasound on 07/22/2018 with possible therapeutic paracentesis if enough ascitic fluid is identified--will get the abdominal ultrasound here on 07/22/2018 -Continue lactulose and Aldactone  4) acute hypoxic respiratory failure--- secondary to #1 above -Resolved  5) hemophilia--stable no obvious bleeding  6) depressive disorder--- stable, continue  Zoloft  7)right buttock and right hip pressure Injury POA: --Wound care consult appreciated follow recommendations with continuation of care with home health RN  Discharge Diagnoses:  Principal Problem:   Acute respiratory failure with hypoxia ([place]) Active Problems:   Peripheral edema   Hemophilia ([place])   Anasarca   Constrictive pericarditis   Hypoxia   Cirrhosis of liver (HCC)   (HFpEF) heart failure with preserved ejection fraction ([place])  Principal discharge diagnosis: Acute hypoxemic respiratory failure.  Discharge Instructions  Discharge Instructions    Diet - low sodium heart healthy   Complete by: As directed    Increase activity slowly   Complete by: As directed      Allergies as of 07/22/2018      Reactions   Doxycycline Dermatitis, Itching   Petrolatum Rash      Medication List    TAKE these medications   atorvastatin 40 MG tablet Commonly known as: LIPITOR Take 1 tablet (40 mg total) by mouth daily at 6 PM.   cholecalciferol 1000 units tablet Commonly known as: VITAMIN D Take 2,000 Units by mouth daily.   feeding supplement (ENSURE ENLIVE) Liqd Take 237 mLs by mouth 2 (two) times daily between meals.   ferrous sulfate 325 (65 FE) MG tablet Take 1 tablet (325 mg total) by mouth 2 (two) times daily with a meal.   folic acid 1 MG tablet Commonly known as: FOLVITE Take 1 tablet (1 mg total) by mouth daily at 2 PM.   gabapentin 300 MG capsule Commonly known as: NEURONTIN TAKE 2 CAPSULES (600 MG TOTAL) BY MOUTH 3 (THREE) TIMES DAILY.   Gerhardt's butt cream Crea Apply 1 application topically 2 (two) times daily.   Klor-Con M20 20 MEQ tablet Generic drug: potassium chloride SA TAKE 4 TABLETS BY MOUTH 2  TIMES DAILY What changed: See the new instructions.   lactulose 10 GM/15ML solution Commonly known as: CHRONULAC Take 15 mLs (10 g total) by mouth 3 (three) times daily.   Medihoney Wound/Burn Dressing Gel Apply 1 application topically daily.    Restasis 0.05 % ophthalmic emulsion Generic drug: cycloSPORINE Place 1 drop into both eyes daily.   sertraline 50 MG tablet Commonly known as: ZOLOFT Take 1 tablet (50 mg total) by mouth daily.   spironolactone 25 MG tablet Commonly known as: ALDACTONE Take 1 tablet (25 mg total) by mouth 2 (two) times daily.   sulfamethoxazole-trimethoprim 800-160 MG tablet Commonly known as: BACTRIM DS Take 1 tablet by mouth 2 (two) times daily.   torsemide 20 MG tablet Commonly known as: DEMADEX Take 60 mg am (3 tablets) and 40 mg (2 tablets) pm   traZODone 50 MG tablet Commonly known as: DESYREL TAKE 1 TO 2 TABLETS BY MOUTH AT BEDTIME AS NEEDED FOR SLEEP   triamcinolone cream 0.1 % Commonly known as: KENALOG      Follow-up Information    Dettinger, Fransisca Kaufmann, MD Follow up in 1 week(s).   Specialties: Family Medicine, Cardiology Contact information: [place]  [address] [phone]        Arnoldo Lenis, MD .   Specialty: Cardiology Contact information: [place] [address] [phone]          Allergies  Allergen Reactions  . Doxycycline Dermatitis and Itching  . Petrolatum Rash    Consultations:  None   Procedures/Studies: Dg Chest 2 View  Result Date: 07/22/2018 CLINICAL DATA:  Short of breath. EXAM: CHEST - 2 VIEW COMPARISON:  07/20/2018 FINDINGS: Cardiac silhouette is partly obscured. It appears mildly enlarged. No mediastinal or hilar masses. There is consolidation at the right lung base, in the right middle and lower lobes, consistent with a combination of pneumonia or atelectasis and a moderate pleural effusion. Medial left lung base opacity consistent with atelectasis. Small left pleural effusion. Lungs also demonstrate thickened bronchovascular markings bilaterally with mild interstitial thickening in the bases. No pneumothorax. Skeletal structures are grossly intact. IMPRESSION: 1. Findings similar to the most recent  prior exam allow for differences in technique, consistent with congestive heart failure. 2. Right greater than left pleural effusions. The left lung base opacity consistent with atelectasis. Right lung base opacity consistent with atelectasis or possibly pneumonia. Prominent bronchovascular markings and mild interstitial thickening consistent with interstitial edema. Mild cardiomegaly. 3. No new lung abnormalities. Electronically Signed   By: Lajean Manes M.D.   On: 07/22/2018 13:02   Dg Ankle Complete Right  Result Date: 07/20/2018 CLINICAL DATA:  Recent fall with pain, initial encounter EXAM: RIGHT ANKLE - COMPLETE 3+ VIEW COMPARISON:  None. FINDINGS: There is no evidence of fracture, dislocation, or joint effusion. There is no evidence of arthropathy or other focal bone abnormality. Soft tissues are unremarkable. IMPRESSION: No acute abnormality noted. Electronically Signed   By: Inez Catalina M.D.   On: 07/20/2018 14:11   Ct Head Wo Contrast  Result Date: 07/20/2018 CLINICAL DATA:  Patient fell off of his bed. Trauma to the face. EXAM: CT HEAD WITHOUT CONTRAST CT MAXILLOFACIAL WITHOUT CONTRAST CT CERVICAL SPINE WITHOUT CONTRAST TECHNIQUE: Multidetector CT imaging of the head, cervical spine, and maxillofacial structures were performed using the standard protocol without intravenous contrast. Multiplanar CT image reconstructions of the cervical spine and maxillofacial structures were also generated. COMPARISON:  None. FINDINGS: CT HEAD FINDINGS Brain: No evidence  of acute infarction, hemorrhage, hydrocephalus, extra-axial collection or mass lesion/mass effect. Mild brain parenchymal volume loss and deep white matter microangiopathy. Vascular: No hyperdense vessel or unexpected calcification. Skull: Normal. Negative for fracture or focal lesion. Other: None. CT MAXILLOFACIAL FINDINGS Osseous: Minimally comminuted and displaced fracture of the tip of the nasal bones. Orbits: Negative. No traumatic or  inflammatory finding. Sinuses: Clear. Soft tissues: Soft tissues allowing of the nose. CT CERVICAL SPINE FINDINGS Alignment: Reversal of cervical lordosis. Skull base and vertebrae: No acute fracture. No primary bone lesion or focal pathologic process. Superior endplate compression deformity of T1 and T2 vertebral bodies, likely degenerative. Soft tissues and spinal canal: No prevertebral fluid or swelling. No visible canal hematoma. Disc levels:  Multilevel osteoarthritic changes. Upper chest: Bilateral pleural effusions. Other: None. IMPRESSION: 1. No acute intracranial abnormality. 2. Atrophy, chronic microvascular disease. 3. Minimally comminuted and displaced fracture of the tip of the nasal bones. 4. No evidence of acute traumatic injury to the cervical spine. 5. Multilevel osteoarthritic changes of the cervical spine. 6. Superior endplate compression deformity of T1 and T2 vertebral bodies, likely degenerative. 7. Bilateral pleural effusions. Electronically Signed   By: Fidela Salisbury M.D.   On: 07/20/2018 15:06   Ct Cervical Spine Wo Contrast  Result Date: 07/20/2018 CLINICAL DATA:  Patient fell off of his bed. Trauma to the face. EXAM: CT HEAD WITHOUT CONTRAST CT MAXILLOFACIAL WITHOUT CONTRAST CT CERVICAL SPINE WITHOUT CONTRAST TECHNIQUE: Multidetector CT imaging of the head, cervical spine, and maxillofacial structures were performed using the standard protocol without intravenous contrast. Multiplanar CT image reconstructions of the cervical spine and maxillofacial structures were also generated. COMPARISON:  None. FINDINGS: CT HEAD FINDINGS Brain: No evidence of acute infarction, hemorrhage, hydrocephalus, extra-axial collection or mass lesion/mass effect. Mild brain parenchymal volume loss and deep white matter microangiopathy. Vascular: No hyperdense vessel or unexpected calcification. Skull: Normal. Negative for fracture or focal lesion. Other: None. CT MAXILLOFACIAL FINDINGS Osseous:  Minimally comminuted and displaced fracture of the tip of the nasal bones. Orbits: Negative. No traumatic or inflammatory finding. Sinuses: Clear. Soft tissues: Soft tissues allowing of the nose. CT CERVICAL SPINE FINDINGS Alignment: Reversal of cervical lordosis. Skull base and vertebrae: No acute fracture. No primary bone lesion or focal pathologic process. Superior endplate compression deformity of T1 and T2 vertebral bodies, likely degenerative. Soft tissues and spinal canal: No prevertebral fluid or swelling. No visible canal hematoma. Disc levels:  Multilevel osteoarthritic changes. Upper chest: Bilateral pleural effusions. Other: None. IMPRESSION: 1. No acute intracranial abnormality. 2. Atrophy, chronic microvascular disease. 3. Minimally comminuted and displaced fracture of the tip of the nasal bones. 4. No evidence of acute traumatic injury to the cervical spine. 5. Multilevel osteoarthritic changes of the cervical spine. 6. Superior endplate compression deformity of T1 and T2 vertebral bodies, likely degenerative. 7. Bilateral pleural effusions. Electronically Signed   By: Fidela Salisbury M.D.   On: 07/20/2018 15:06   US Abdomen Limited  Result Date: 07/22/2018 CLINICAL DATA:  History of cirrhosis.  Evaluate for ascites. EXAM: LIMITED ABDOMEN ULTRASOUND FOR ASCITES TECHNIQUE: Limited ultrasound survey for ascites was performed in all four abdominal quadrants. COMPARISON:  CT, 10/30/2017 FINDINGS: Trace ascites adjacent to the liver. IMPRESSION: Trace ascites only, noted adjacent to the liver. Electronically Signed   By: Lajean Manes M.D.   On: 07/22/2018 12:57   Dg Chest Portable 1 View  Result Date: 07/20/2018 CLINICAL DATA:  68 year old with acute hypoxia. Patient was sitting on the side of her bed  this morning and fell asleep and fell out of bed. Abrasion to the nose. Initial encounter. EXAM: PORTABLE CHEST 1 VIEW COMPARISON:  07/22/2017 and earlier. FINDINGS: Cardiac silhouette moderately  to markedly enlarged, unchanged. Mild to moderate diffuse interstitial pulmonary edema. Large RIGHT pleural effusion and small LEFT pleural effusion. Associated consolidation in the RIGHT LOWER LOBE and RIGHT MIDDLE LOBE. IMPRESSION: 1. Mild-to-moderate CHF, with stable moderate to marked cardiomegaly and mild to moderate diffuse interstitial pulmonary edema. 2. Large RIGHT pleural effusion and small LEFT pleural effusion. 3. Associated passive atelectasis involving the RIGHT MIDDLE LOBE and RIGHT LOWER LOBE (favored over pneumonia). Electronically Signed   By: Evangeline Dakin M.D.   On: 07/20/2018 14:17   Dg Foot Complete Right  Result Date: 07/20/2018 CLINICAL DATA:  Recent fall with foot pain, initial encounter EXAM: RIGHT FOOT COMPLETE - 3+ VIEW COMPARISON:  None. FINDINGS: There is no evidence of fracture or dislocation. There is no evidence of arthropathy or other focal bone abnormality. Soft tissues are unremarkable. IMPRESSION: No acute abnormality noted Electronically Signed   By: Inez Catalina M.D.   On: 07/20/2018 14:11   Ct Maxillofacial Wo Contrast  Result Date: 07/20/2018 CLINICAL DATA:  Patient fell off of his bed. Trauma to the face. EXAM: CT HEAD WITHOUT CONTRAST CT MAXILLOFACIAL WITHOUT CONTRAST CT CERVICAL SPINE WITHOUT CONTRAST TECHNIQUE: Multidetector CT imaging of the head, cervical spine, and maxillofacial structures were performed using the standard protocol without intravenous contrast. Multiplanar CT image reconstructions of the cervical spine and maxillofacial structures were also generated. COMPARISON:  None. FINDINGS: CT HEAD FINDINGS Brain: No evidence of acute infarction, hemorrhage, hydrocephalus, extra-axial collection or mass lesion/mass effect. Mild brain parenchymal volume loss and deep white matter microangiopathy. Vascular: No hyperdense vessel or unexpected calcification. Skull: Normal. Negative for fracture or focal lesion. Other: None. CT MAXILLOFACIAL FINDINGS  Osseous: Minimally comminuted and displaced fracture of the tip of the nasal bones. Orbits: Negative. No traumatic or inflammatory finding. Sinuses: Clear. Soft tissues: Soft tissues allowing of the nose. CT CERVICAL SPINE FINDINGS Alignment: Reversal of cervical lordosis. Skull base and vertebrae: No acute fracture. No primary bone lesion or focal pathologic process. Superior endplate compression deformity of T1 and T2 vertebral bodies, likely degenerative. Soft tissues and spinal canal: No prevertebral fluid or swelling. No visible canal hematoma. Disc levels:  Multilevel osteoarthritic changes. Upper chest: Bilateral pleural effusions. Other: None. IMPRESSION: 1. No acute intracranial abnormality. 2. Atrophy, chronic microvascular disease. 3. Minimally comminuted and displaced fracture of the tip of the nasal bones. 4. No evidence of acute traumatic injury to the cervical spine. 5. Multilevel osteoarthritic changes of the cervical spine. 6. Superior endplate compression deformity of T1 and T2 vertebral bodies, likely degenerative. 7. Bilateral pleural effusions. Electronically Signed   By: Fidela Salisbury M.D.   On: 07/20/2018 15:06     Discharge Exam: Vitals:   07/21/18 2123 07/22/18 0545  BP: 113/72 107/75  Pulse: (!) 123 (!) 121  Resp: 17 18  Temp: 98.1 F (36.7 C) 98 F (36.7 C)  SpO2: 96% 97%   Vitals:   07/21/18 1319 07/21/18 2123 07/22/18 0500 07/22/18 0545  BP: 113/85 113/72  107/75  Pulse: (!) 123 (!) 123  (!) 121  Resp: 19 17  18   Temp: 98 F (36.7 C) 98.1 F (36.7 C)  98 F (36.7 C)  TempSrc:  Oral  Oral  SpO2: 97% 96%  97%  Weight:   93.1 kg   Height:  General: Pt is alert, awake, not in acute distress Cardiovascular: RRR, S1/S2 +, no rubs, no gallops Respiratory: CTA bilaterally, no wheezing, no rhonchi Abdominal: Soft, NT, ND, bowel sounds + Extremities: no edema, no cyanosis, chronic wounds noted    The results of significant diagnostics from this  hospitalization (including imaging, microbiology, ancillary and laboratory) are listed below for reference.     Microbiology: Recent Results (from the past 240 hour(s))  Urine culture     Status: Abnormal   Collection Time: 07/20/18 12:36 PM   Specimen: Urine, Random  Result Value Ref Range Status   Specimen Description   Final    URINE, RANDOM Performed at [facility], [address] [place], [place] [address]    Special Requests   Final    NONE Performed at [facility] [address], [place], [place] [address]    Culture MULTIPLE SPECIES PRESENT, SUGGEST RECOLLECTION (A)  Final   Report Status 07/21/2018 FINAL  Final  Blood culture (routine x 2)     Status: None (Preliminary result)   Collection Time: 07/20/18  1:12 PM   Specimen: BLOOD  Result Value Ref Range Status   Specimen Description BLOOD  Final   Special Requests   Final    BOTTLES DRAWN AEROBIC ONLY Blood Culture results may not be optimal due to an inadequate volume of blood received in culture bottles   Culture   Final    NO GROWTH 2 DAYS Performed at [facility], [address] [place], [place] [address]    Report Status PENDING  Incomplete  Blood culture (routine x 2)     Status: None (Preliminary result)   Collection Time: 07/20/18  1:13 PM   Specimen: BLOOD LEFT HAND  Result Value Ref Range Status   Specimen Description BLOOD LEFT HAND  Final   Special Requests   Final    BOTTLES DRAWN AEROBIC AND ANAEROBIC Blood Culture results may not be optimal due to an inadequate volume of blood received in culture bottles   Culture   Final    NO GROWTH 2 DAYS Performed at [facility], [address], [place], [place] [address]    Report Status PENDING  Incomplete  SARS Coronavirus 2 (CEPHEID- Performed in [facility] hospital lab), Hosp Order     Status: None   Collection Time: 07/20/18  1:17 PM   Specimen: Nasopharyngeal Swab  Result Value Ref Range Status   SARS Coronavirus 2 NEGATIVE NEGATIVE Final     Comment: (NOTE) If result is NEGATIVE SARS-CoV-2 target nucleic acids are NOT DETECTED. The SARS-CoV-2 RNA is generally detectable in upper and lower  respiratory specimens during the acute phase of infection. The lowest  concentration of SARS-CoV-2 viral copies this assay can detect is 250  copies / mL. A negative result does not preclude SARS-CoV-2 infection  and should not be used as the sole basis for treatment or other  patient management decisions.  A negative result may occur with  improper specimen collection / handling, submission of specimen other  than nasopharyngeal swab, presence of viral mutation(s) within the  areas targeted by this assay, and inadequate number of viral copies  (<250 copies / mL). A negative result must be combined with clinical  observations, patient history, and epidemiological information. If result is POSITIVE SARS-CoV-2 target nucleic acids are DETECTED. The SARS-CoV-2 RNA is generally detectable in upper and lower  respiratory specimens dur ing the acute phase of infection.  Positive  results are indicative of  active infection with SARS-CoV-2.  Clinical  correlation with patient history and other diagnostic information is  necessary to determine patient infection status.  Positive results do  not rule out bacterial infection or co-infection with other viruses. If result is PRESUMPTIVE POSTIVE SARS-CoV-2 nucleic acids MAY BE PRESENT.   A presumptive positive result was obtained on the submitted specimen  and confirmed on repeat testing.  While 2019 novel coronavirus  (SARS-CoV-2) nucleic acids may be present in the submitted sample  additional confirmatory testing may be necessary for epidemiological  and / or clinical management purposes  to differentiate between  SARS-CoV-2 and other Sarbecovirus currently known to infect humans.  If clinically indicated additional testing with an alternate test  methodology [phone]) is advised. The SARS-CoV-2  RNA is generally  detectable in upper and lower respiratory sp ecimens during the acute  phase of infection. The expected result is Negative. Fact Sheet for Patients:  StrictlyIdeas.no Fact Sheet for Healthcare Providers: BankingDealers.co.za This test is not yet approved or cleared by the [place] FDA and has been authorized for detection and/or diagnosis of SARS-CoV-2 by FDA under an Emergency Use Authorization (EUA).  This EUA will remain in effect (meaning this test can be used) for the duration of the COVID-19 declaration under Section 564(b)(1) of the Act, 21 U.S.C. section 360bbb-3(b)(1), unless the authorization is terminated or revoked sooner. Performed at [facility], [address], [place], [place] [address]      Labs: BNP (last 3 results) Recent Labs    07/22/17 1734 07/20/18 1310  BNP 326.0* 595.6*   Basic Metabolic Panel: Recent Labs  Lab 07/20/18 1310 07/21/18 0430  NA 128* 132*  K 4.5 4.5  CL 92* 94*  CO2 27 30  GLUCOSE 83 76  BUN 16 17  CREATININE 1.24 1.26*  CALCIUM 7.8* 8.1*   Liver Function Tests: Recent Labs  Lab 07/20/18 1310  AST 47*  ALT 32  ALKPHOS 137*  BILITOT 1.4*  PROT 7.3  ALBUMIN 2.3*   Recent Labs  Lab 07/20/18 1310  LIPASE 37   Recent Labs  Lab 07/20/18 1312  AMMONIA 32   CBC: Recent Labs  Lab 07/20/18 1310  WBC 9.1  NEUTROABS 6.3  HGB 11.7*  HCT 36.1*  MCV 95.8  PLT 171   Cardiac Enzymes: No results for input(s): CKTOTAL, CKMB, CKMBINDEX, TROPONINI in the last 168 hours. BNP: Invalid input(s): POCBNP CBG: No results for input(s): GLUCAP in the last 168 hours. D-Dimer No results for input(s): DDIMER in the last 72 hours. Hgb A1c No results for input(s): HGBA1C in the last 72 hours. Lipid Profile No results for input(s): CHOL, HDL, LDLCALC, TRIG, CHOLHDL, LDLDIRECT in the last 72 hours. Thyroid function studies No results for input(s): TSH, T4TOTAL,  T3FREE, THYROIDAB in the last 72 hours.  Invalid input(s): FREET3 Anemia work up No results for input(s): VITAMINB12, FOLATE, FERRITIN, TIBC, IRON, RETICCTPCT in the last 72 hours. Urinalysis    Component Value Date/Time   COLORURINE YELLOW 07/20/2018 1236   APPEARANCEUR CLEAR 07/20/2018 1236   LABSPEC 1.009 07/20/2018 1236   PHURINE 7.0 07/20/2018 1236   GLUCOSEU NEGATIVE 07/20/2018 1236   HGBUR NEGATIVE 07/20/2018 1236   [place] 07/20/2018 1236   [facility] 07/20/2018 1236   PROTEINUR NEGATIVE 07/20/2018 1236   NITRITE NEGATIVE 07/20/2018 1236   LEUKOCYTESUR NEGATIVE 07/20/2018 1236   Sepsis Labs Invalid input(s): PROCALCITONIN,  WBC,  LACTICIDVEN Microbiology Recent Results (from the past 240 hour(s))  Urine culture  Status: Abnormal   Collection Time: 07/20/18 12:36 PM   Specimen: Urine, Random  Result Value Ref Range Status   Specimen Description   Final    URINE, RANDOM Performed at [facility], [address], [place], [place] [address]    Special Requests   Final    NONE Performed at [facility], [address] [place], [place] [address]    Culture MULTIPLE SPECIES PRESENT, SUGGEST RECOLLECTION (A)  Final   Report Status 07/21/2018 FINAL  Final  Blood culture (routine x 2)     Status: None (Preliminary result)   Collection Time: 07/20/18  1:12 PM   Specimen: BLOOD  Result Value Ref Range Status   Specimen Description BLOOD  Final   Special Requests   Final    BOTTLES DRAWN AEROBIC ONLY Blood Culture results may not be optimal due to an inadequate volume of blood received in culture bottles   Culture   Final    NO GROWTH 2 DAYS Performed at [facility], [address], [place], [place] [address]    Report Status PENDING  Incomplete  Blood culture (routine x 2)     Status: None (Preliminary result)   Collection Time: 07/20/18  1:13 PM   Specimen: BLOOD LEFT HAND  Result Value Ref Range Status   Specimen Description BLOOD LEFT HAND   Final   Special Requests   Final    BOTTLES DRAWN AEROBIC AND ANAEROBIC Blood Culture results may not be optimal due to an inadequate volume of blood received in culture bottles   Culture   Final    NO GROWTH 2 DAYS Performed at [facility], [address], [place], [place] [address]    Report Status PENDING  Incomplete  SARS Coronavirus 2 (CEPHEID- Performed in [place] hospital lab), Hosp Order     Status: None   Collection Time: 07/20/18  1:17 PM   Specimen: Nasopharyngeal Swab  Result Value Ref Range Status   SARS Coronavirus 2 NEGATIVE NEGATIVE Final    Comment: (NOTE) If result is NEGATIVE SARS-CoV-2 target nucleic acids are NOT DETECTED. The SARS-CoV-2 RNA is generally detectable in upper and lower  respiratory specimens during the acute phase of infection. The lowest  concentration of SARS-CoV-2 viral copies this assay can detect is 250  copies / mL. A negative result does not preclude SARS-CoV-2 infection  and should not be used as the sole basis for treatment or other  patient management decisions.  A negative result may occur with  improper specimen collection / handling, submission of specimen other  than nasopharyngeal swab, presence of viral mutation(s) within the  areas targeted by this assay, and inadequate number of viral copies  (<250 copies / mL). A negative result must be combined with clinical  observations, patient history, and epidemiological information. If result is POSITIVE SARS-CoV-2 target nucleic acids are DETECTED. The SARS-CoV-2 RNA is generally detectable in upper and lower  respiratory specimens dur ing the acute phase of infection.  Positive  results are indicative of active infection with SARS-CoV-2.  Clinical  correlation with patient history and other diagnostic information is  necessary to determine patient infection status.  Positive results do  not rule out bacterial infection or co-infection with other viruses. If result is PRESUMPTIVE  POSTIVE SARS-CoV-2 nucleic acids MAY BE PRESENT.   A presumptive positive result was obtained on the submitted specimen  and confirmed on repeat testing.  While 2019 novel coronavirus  (SARS-CoV-2) nucleic acids may be present in  the submitted sample  additional confirmatory testing may be necessary for epidemiological  and / or clinical management purposes  to differentiate between  SARS-CoV-2 and other Sarbecovirus currently known to infect humans.  If clinically indicated additional testing with an alternate test  methodology [phone]) is advised. The SARS-CoV-2 RNA is generally  detectable in upper and lower respiratory sp ecimens during the acute  phase of infection. The expected result is Negative. Fact Sheet for Patients:  StrictlyIdeas.no Fact Sheet for Healthcare Providers: BankingDealers.co.za This test is not yet approved or cleared by the [place] FDA and has been authorized for detection and/or diagnosis of SARS-CoV-2 by FDA under an Emergency Use Authorization (EUA).  This EUA will remain in effect (meaning this test can be used) for the duration of the COVID-19 declaration under Section 564(b)(1) of the Act, 21 U.S.C. section 360bbb-3(b)(1), unless the authorization is terminated or revoked sooner. Performed at [facility], [address], [place], [place] [address]      Time coordinating discharge: 35 minutes  SIGNED:   Rodena Goldmann, DO [facility] 07/22/2018, 2:17 PM  If 7PM-7AM, please contact night-coverage [email or web address] Password [email or web address]

## 2018-07-22 NOTE — TOC Transition Note (Signed)
Transition of Care [facility]) - CM/SW Discharge Note   Patient Details  Name: Kent Bryant MRN: [id number] Date of Birth: 02-24-1950  Transition of Care [facility]) CM/SW Contact:  Shade Flood, LCSW Phone Number: 07/22/2018, 3:15 PM   Clinical Narrative:     Pt discharging home today. He will continue with [facility] RN and PT. Notified Tim from [facility] of pt's dc. Per Tim, they will follow up with pt at home.   There are no other TOC needs for dc.  Final next level of care: Home w Home Health Services Barriers to Discharge: Barriers Resolved   Patient Goals and CMS Choice Patient states their goals for this hospitalization and ongoing recovery are:: return home      Discharge Placement                       Discharge Plan and Services     Post Acute Care Choice: Home Health, Resumption of Svcs/PTA Provider                    HH Arranged: PT, RN [place] Agency: [facility] (now [facility]) Date [place]: 07/21/18 Time [place]: 1353 Representative spoke with at [place]: [place] ([place]) Interventions     Readmission Risk Interventions Readmission Risk Prevention Plan 07/22/2018  Transportation Screening Complete  Home Care Screening Complete  Medication Review (RN CM) Complete  Some recent data might be hidden

## 2018-07-22 NOTE — Telephone Encounter (Signed)
Rx placed up- left detailed message on vm.

## 2018-07-24 ENCOUNTER — Telehealth: Payer: Self-pay | Admitting: Cardiology

## 2018-07-24 NOTE — Telephone Encounter (Signed)
Dr Rush Landmark please see the [place] that was completed at [facility] on 7/17

## 2018-07-24 NOTE — Telephone Encounter (Signed)
[facility] @ [phone] no answer left msg to call back.

## 2018-07-24 NOTE — Telephone Encounter (Signed)
Returned pt call. Spoke with sister. She states that the pt was seen in ED for a fall. She stated that while in hospital they have patient IV lasix. She has started him on back on torsemide 60 mg in the AM & 40 mg in the evening. She wanted to know if she needed to repeat any labwork since it was done in the hospital. Please advise.

## 2018-07-24 NOTE — Telephone Encounter (Signed)
   We can see the labs that were performed in the hospital and renal function was overall stable. Given that he is back on an oral regimen, would still recommend a repeat BMET two weeks from now which can be obtained by Home Health.   Signed, Erma Heritage, PA-C 07/24/2018, 4:40 PM Pager: [phone]

## 2018-07-24 NOTE — Telephone Encounter (Signed)
Patient sister called said that when he was at [facility] they did the ultrasound that Dr. Rush Landmark wanted done and would like to know if Dr. Rush Landmark was aware if it.

## 2018-07-24 NOTE — Telephone Encounter (Signed)
Please give pt's sister Nicholes Rough a call @ [phone]

## 2018-07-25 LAB — CULTURE, BLOOD (ROUTINE X 2)
Culture: NO GROWTH
Culture: NO GROWTH

## 2018-07-27 NOTE — Telephone Encounter (Signed)
[facility], was given VM of Kent Bryant, asked that her return call

## 2018-07-28 ENCOUNTER — Ambulatory Visit (INDEPENDENT_AMBULATORY_CARE_PROVIDER_SITE_OTHER): Payer: 59 | Admitting: Family Medicine

## 2018-07-28 ENCOUNTER — Telehealth: Payer: Self-pay | Admitting: Family Medicine

## 2018-07-28 NOTE — Progress Notes (Signed)
Patient had to reschedule because were running behind

## 2018-07-28 NOTE — Telephone Encounter (Signed)
Lift chair was faxed to [place] with Demographics and office notes original Rx is in Drawer if patient wants to pick up.

## 2018-07-29 NOTE — Telephone Encounter (Signed)
Spoke with Darrick Penna out of the [place] office [phone]. She will have labs drawn.

## 2018-07-31 ENCOUNTER — Ambulatory Visit (INDEPENDENT_AMBULATORY_CARE_PROVIDER_SITE_OTHER): Payer: 59 | Admitting: Family Medicine

## 2018-07-31 ENCOUNTER — Encounter: Payer: Self-pay | Admitting: Family Medicine

## 2018-07-31 ENCOUNTER — Other Ambulatory Visit: Payer: Self-pay

## 2018-07-31 DIAGNOSIS — N179 Acute kidney failure, unspecified: Secondary | ICD-10-CM | POA: Diagnosis not present

## 2018-07-31 DIAGNOSIS — N189 Chronic kidney disease, unspecified: Secondary | ICD-10-CM | POA: Diagnosis not present

## 2018-07-31 DIAGNOSIS — S022XXA Fracture of nasal bones, initial encounter for closed fracture: Secondary | ICD-10-CM | POA: Diagnosis not present

## 2018-07-31 DIAGNOSIS — I5033 Acute on chronic diastolic (congestive) heart failure: Secondary | ICD-10-CM | POA: Diagnosis not present

## 2018-07-31 NOTE — Progress Notes (Signed)
Virtual Visit via telephone Note  I connected with Kent Bryant on 07/31/18 at 1502 by telephone and verified that I am speaking with the correct person using two identifiers. Kent Bryant is currently located at home and sister are currently with her during visit. The provider, Fransisca Kaufmann Dettinger, MD is located in their office at time of visit.  Call ended at 1514  I discussed the limitations, risks, security and privacy concerns of performing an evaluation and management service by telephone and the availability of in person appointments. I also discussed with the patient that there may be a patient responsible charge related to this service. The patient expressed understanding and agreed to proceed.   History and Present Illness Hospital follow up for nasal fracture and nosebleed and fluid overload from cirrhosis and for fall.  He fell asleep and rolled off the bed.  He denies any SOB or wheezing.  His weight is down significantly and  It is 198. He feels  A lot better but is just weak and sleepy.   No diagnosis found.  Outpatient Encounter Medications as of 07/31/2018  Medication Sig  . atorvastatin (LIPITOR) 40 MG tablet Take 1 tablet (40 mg total) by mouth daily at 6 PM.  . cholecalciferol (VITAMIN D) 1000 units tablet Take 2,000 Units by mouth daily.  . feeding supplement, ENSURE ENLIVE, (ENSURE ENLIVE) LIQD Take 237 mLs by mouth 2 (two) times daily between meals.  . ferrous sulfate 325 (65 FE) MG tablet Take 1 tablet (325 mg total) by mouth 2 (two) times daily with a meal.  . folic acid (FOLVITE) 1 MG tablet Take 1 tablet (1 mg total) by mouth daily at 2 PM.  . gabapentin (NEURONTIN) 300 MG capsule TAKE 2 CAPSULES (600 MG TOTAL) BY MOUTH 3 (THREE) TIMES DAILY.  Marland Kitchen Hydrocortisone (GERHARDT'S BUTT CREAM) CREA Apply 1 application topically 2 (two) times daily.  Marland Kitchen KLOR-CON M20 20 MEQ tablet TAKE 4 TABLETS BY MOUTH 2 TIMES DAILY (Patient taking differently: 2 tablets BID)  . lactulose  (CHRONULAC) 10 GM/15ML solution Take 15 mLs (10 g total) by mouth 3 (three) times daily.  . RESTASIS 0.05 % ophthalmic emulsion Place 1 drop into both eyes daily.  . sertraline (ZOLOFT) 50 MG tablet Take 1 tablet (50 mg total) by mouth daily.  Marland Kitchen spironolactone (ALDACTONE) 25 MG tablet Take 1 tablet (25 mg total) by mouth 2 (two) times daily.  Marland Kitchen sulfamethoxazole-trimethoprim (BACTRIM DS) 800-160 MG tablet Take 1 tablet by mouth 2 (two) times daily.  Marland Kitchen torsemide (DEMADEX) 20 MG tablet Take 60 mg am (3 tablets) and 40 mg (2 tablets) pm  . traZODone (DESYREL) 50 MG tablet TAKE 1 TO 2 TABLETS BY MOUTH AT BEDTIME AS NEEDED FOR SLEEP  . triamcinolone cream (KENALOG) 0.1 %   . Wound Dressings (MEDIHONEY WOUND/BURN DRESSING) GEL Apply 1 application topically daily. (Patient not taking: Reported on 07/20/2018)   No facility-administered encounter medications on file as of 07/31/2018.     Review of Systems  Constitutional: Negative for chills and fever.  Eyes: Negative for visual disturbance.  Respiratory: Negative for shortness of breath and wheezing.   Cardiovascular: Positive for leg swelling. Negative for chest pain.  Gastrointestinal: Positive for abdominal distention.  Musculoskeletal: Negative for back pain and gait problem.  Skin: Negative for rash.  Neurological: Negative for dizziness, weakness and light-headedness.  All other systems reviewed and are negative.   Observations/Objective: Patient sounds comfortable and in no acute distress  Assessment and Plan:  Problem List Items Addressed This Visit    None    Visit Diagnoses    Acute on chronic diastolic congestive heart failure ([place])    -  Primary   Acute kidney injury superimposed on CKD ([place])       Closed fracture of nasal bone, initial encounter         He needs a chemistry panel and cbc for hospital follow up [facility] blood draw.   Still taking high dose furosemide 3 am and 2 in the evening  Follow Up  Instructions:  Follow up in 1-2 months   I discussed the assessment and treatment plan with the patient. The patient was provided an opportunity to ask questions and all were answered. The patient agreed with the plan and demonstrated an understanding of the instructions.   The patient was advised to call back or seek an in-person evaluation if the symptoms worsen or if the condition fails to improve as anticipated.  The above assessment and management plan was discussed with the patient. The patient verbalized understanding of and has agreed to the management plan. Patient is aware to call the clinic if symptoms persist or worsen. Patient is aware when to return to the clinic for a follow-up visit. Patient educated on when it is appropriate to go to the emergency department.    I provided 12 minutes of non-face-to-face time during this encounter.    Worthy Rancher, MD

## 2018-08-06 ENCOUNTER — Telehealth: Payer: Self-pay | Admitting: Family Medicine

## 2018-08-06 NOTE — Telephone Encounter (Signed)
I have made up a letter for the patient for a lift chair

## 2018-08-09 ENCOUNTER — Other Ambulatory Visit: Payer: Self-pay | Admitting: Family Medicine

## 2018-08-09 DIAGNOSIS — G609 Hereditary and idiopathic neuropathy, unspecified: Secondary | ICD-10-CM

## 2018-08-11 ENCOUNTER — Telehealth: Payer: Self-pay | Admitting: *Deleted

## 2018-08-11 NOTE — Telephone Encounter (Signed)
VM from Angie w/ [facility] Pt's pulse 100-110 @ rest pounding, swelling in abdomen, pitting edema Right leg up to hip. Weight has been fluctuation but below 200. Wounds on right inner buttocks

## 2018-08-12 NOTE — Telephone Encounter (Signed)
If patient is having right leg swelling and increased heart rate, he needs to be seen or go to the ED. Is he having any SOB?

## 2018-08-13 NOTE — Telephone Encounter (Signed)
Angie with [facility] aware.  States that patient always has SOB

## 2018-08-23 ENCOUNTER — Other Ambulatory Visit: Payer: Self-pay | Admitting: Cardiology

## 2018-08-23 ENCOUNTER — Other Ambulatory Visit: Payer: Self-pay | Admitting: Family Medicine

## 2018-08-23 DIAGNOSIS — G609 Hereditary and idiopathic neuropathy, unspecified: Secondary | ICD-10-CM

## 2018-08-27 ENCOUNTER — Ambulatory Visit: Payer: 59 | Admitting: Student

## 2018-09-02 ENCOUNTER — Other Ambulatory Visit: Payer: Self-pay | Admitting: Cardiology

## 2018-09-03 ENCOUNTER — Other Ambulatory Visit: Payer: Self-pay | Admitting: Family Medicine

## 2018-09-03 ENCOUNTER — Telehealth: Payer: Self-pay | Admitting: Cardiology

## 2018-09-03 MED ORDER — TORSEMIDE 20 MG PO TABS: ORAL_TABLET | ORAL | 1 refills | Status: DC

## 2018-09-03 NOTE — Telephone Encounter (Signed)
Please call pt's sister, she's not sure how much Torsemide he's supposed to be taking.

## 2018-09-03 NOTE — Telephone Encounter (Signed)
done

## 2018-09-03 NOTE — Telephone Encounter (Signed)
Refill on Torsemide sent to [place] / tg

## 2018-09-03 NOTE — Telephone Encounter (Signed)
Advised pt's sister the correct dose of torsemide per last telehealth visit is 60 mg AM and 40 mg PM. She voiced understanding and will see Dr. Harl Bowie 9/10.

## 2018-09-09 ENCOUNTER — Other Ambulatory Visit: Payer: Self-pay

## 2018-09-10 ENCOUNTER — Encounter: Payer: Self-pay | Admitting: Family Medicine

## 2018-09-10 ENCOUNTER — Ambulatory Visit (INDEPENDENT_AMBULATORY_CARE_PROVIDER_SITE_OTHER): Payer: 59 | Admitting: Family Medicine

## 2018-09-10 VITALS — BP 90/60 | HR 122 | Temp 98.2°F | Ht 71.0 in | Wt 195.4 lb

## 2018-09-10 DIAGNOSIS — R600 Localized edema: Secondary | ICD-10-CM

## 2018-09-10 DIAGNOSIS — E876 Hypokalemia: Secondary | ICD-10-CM

## 2018-09-10 DIAGNOSIS — K766 Portal hypertension: Secondary | ICD-10-CM

## 2018-09-10 DIAGNOSIS — K7460 Unspecified cirrhosis of liver: Secondary | ICD-10-CM

## 2018-09-10 DIAGNOSIS — R609 Edema, unspecified: Secondary | ICD-10-CM

## 2018-09-10 NOTE — Progress Notes (Signed)
BP 90/60   Pulse (!) 122   Temp 98.2 F (36.8 C) (Oral)   Ht '5\' 11"'  (1.803 m)   Wt 195 lb 6.4 oz (88.6 kg)   BMI 27.25 kg/m    Subjective:   Patient ID: Kent Bryant, male    DOB: [birth year], 68 y.o.   MRN: [id number]  HPI: Kent Bryant is a 68 y.o. male presenting on 09/10/2018 for 1 month follow up   HPI Patient is coming in for follow-up for her portal hypertension and peripheral edema and cirrhosis.  Wound care is still seeing him at home and in their office to help manage this.  That has been improving and the left leg looks a lot better and the swelling is down significantly in the right leg is still weeping especially around the toes and is having difficulty with it.  He denies any fevers or chills or redness or warmth.  The weeping and drainage is mostly between the toes.  Relevant past medical, surgical, family and social history reviewed and updated as indicated. Interim medical history since our last visit reviewed. Allergies and medications reviewed and updated.  Review of Systems  Constitutional: Negative for chills and fever.  Eyes: Negative for visual disturbance.  Respiratory: Negative for shortness of breath and wheezing.   Cardiovascular: Negative for chest pain and leg swelling.  Gastrointestinal: Positive for abdominal distention.  Musculoskeletal: Negative for back pain and gait problem.  Skin: Positive for rash and wound.  Neurological: Negative for dizziness, weakness and light-headedness.  All other systems reviewed and are negative.   Per HPI unless specifically indicated above   Allergies as of 09/10/2018      Reactions   Doxycycline Dermatitis, Itching   Petrolatum Rash      Medication List       Accurate as of September 10, 2018  1:58 PM. If you have any questions, ask your nurse or doctor.        STOP taking these medications   sulfamethoxazole-trimethoprim 800-160 MG tablet Commonly known as: BACTRIM DS Stopped by: Fransisca Kaufmann Dettinger, MD      TAKE these medications   atorvastatin 40 MG tablet Commonly known as: LIPITOR Take 1 tablet (40 mg total) by mouth daily at 6 PM.   cholecalciferol 1000 units tablet Commonly known as: VITAMIN D Take 2,000 Units by mouth daily.   Enulose 10 GM/15ML Soln Generic drug: lactulose (encephalopathy) TAKE 15 MLS (10 G TOTAL) BY MOUTH 3 (THREE) TIMES DAILY.   feeding supplement (ENSURE ENLIVE) Liqd Take 237 mLs by mouth 2 (two) times daily between meals.   ferrous sulfate 325 (65 FE) MG tablet Take 1 tablet (325 mg total) by mouth 2 (two) times daily with a meal.   folic acid 1 MG tablet Commonly known as: FOLVITE Take 1 tablet (1 mg total) by mouth daily at 2 PM.   gabapentin 300 MG capsule Commonly known as: NEURONTIN TAKE 2 CAPSULES (600 MG TOTAL) BY MOUTH 3 (THREE) TIMES DAILY.   Medihoney Wound/Burn Dressing Gel Apply 1 application topically daily.   potassium chloride SA 20 MEQ tablet Commonly known as: [place]-Con M20 2 tablets BID   Restasis 0.05 % ophthalmic emulsion Generic drug: cycloSPORINE Place 1 drop into both eyes daily.   sertraline 50 MG tablet Commonly known as: ZOLOFT Take 1 tablet (50 mg total) by mouth daily.   spironolactone 25 MG tablet Commonly known as: ALDACTONE Take 1 tablet (25 mg total) by mouth 2 (two) times daily.  torsemide 20 MG tablet Commonly known as: DEMADEX TAKE 2 TABLETS BY MOUTH TWICE A DAY   traZODone 50 MG tablet Commonly known as: DESYREL TAKE 1 TO 2 TABLETS BY MOUTH AT BEDTIME AS NEEDED FOR SLEEP   triamcinolone cream 0.1 % Commonly known as: KENALOG        Objective:   BP 90/60   Pulse (!) 122   Temp 98.2 F (36.8 C) (Oral)   Ht '5\' 11"'  (1.803 m)   Wt 195 lb 6.4 oz (88.6 kg)   BMI 27.25 kg/m   Wt Readings from Last 3 Encounters:  09/10/18 195 lb 6.4 oz (88.6 kg)  07/22/18 205 lb 4 oz (93.1 kg)  07/09/18 211 lb (95.7 kg)    Physical Exam Vitals signs and nursing note reviewed.  Constitutional:       General: He is not in acute distress.    Appearance: He is well-developed. He is not diaphoretic.  Eyes:     General: No scleral icterus.    Conjunctiva/sclera: Conjunctivae normal.  Neck:     Musculoskeletal: Neck supple.     Thyroid: No thyromegaly.  Cardiovascular:     Rate and Rhythm: Normal rate and regular rhythm.     Heart sounds: Normal heart sounds. No murmur.  Pulmonary:     Effort: Pulmonary effort is normal. No respiratory distress.     Breath sounds: Normal breath sounds. No wheezing.  Musculoskeletal: Normal range of motion.        General: Swelling (3+ bilateral lower extremity edema, wrapped and left and almost completely reduced but still significant on right lower extremity.,  Weeping between toes.) present.  Lymphadenopathy:     Cervical: No cervical adenopathy.  Skin:    General: Skin is warm and dry.     Findings: No rash.  Neurological:     Mental Status: He is alert and oriented to person, place, and time.     Coordination: Coordination normal.  Psychiatric:        Behavior: Behavior normal.       Assessment & Plan:   Problem List Items Addressed This Visit      Cardiovascular and Mediastinum   Portal hypertension ([place])     Digestive   Cirrhosis of liver ([place])     Other   Hypokalemia - Primary   Relevant Orders   CBC with Differential/Platelet   CMP14+EGFR   Peripheral edema      Patient is still working with wound care for his bilateral lower extremity edema and swelling, he has been changing at home every 2 days and also has wound care facility. Follow up plan: Return in about 4 weeks (around 10/08/2018), or if symptoms worsen or fail to improve, for Recheck chemistry levels per recheck.  Counseling provided for all of the vaccine components Orders Placed This Encounter  Procedures  . CBC with Differential/Platelet  . CMP14+EGFR    Caryl Pina, MD [facility] 09/10/2018, 1:58 PM

## 2018-09-11 LAB — CBC WITH DIFFERENTIAL/PLATELET
Basophils Absolute: 0.1 10*3/uL (ref 0.0–0.2)
Basos: 1 %
EOS (ABSOLUTE): 0.2 10*3/uL (ref 0.0–0.4)
Eos: 2 %
Hematocrit: 34.6 % — ABNORMAL LOW (ref 37.5–51.0)
Hemoglobin: 11.8 g/dL — ABNORMAL LOW (ref 13.0–17.7)
Immature Grans (Abs): 0.2 10*3/uL — ABNORMAL HIGH (ref 0.0–0.1)
Immature Granulocytes: 2 %
Lymphocytes Absolute: 1.5 10*3/uL (ref 0.7–3.1)
Lymphs: 15 %
MCH: 31.2 pg (ref 26.6–33.0)
MCHC: 34.1 g/dL (ref 31.5–35.7)
MCV: 92 fL (ref 79–97)
Monocytes Absolute: 1.1 10*3/uL — ABNORMAL HIGH (ref 0.1–0.9)
Monocytes: 11 %
Neutrophils Absolute: 7 10*3/uL (ref 1.4–7.0)
Neutrophils: 69 %
Platelets: 188 10*3/uL (ref 150–450)
RBC: 3.78 x10E6/uL — ABNORMAL LOW (ref 4.14–5.80)
RDW: 14.5 % (ref 11.6–15.4)
WBC: 9.9 10*3/uL (ref 3.4–10.8)

## 2018-09-11 LAB — CMP14+EGFR
ALT: 13 IU/L (ref 0–44)
AST: 28 IU/L (ref 0–40)
Albumin/Globulin Ratio: 0.7 — ABNORMAL LOW (ref 1.2–2.2)
Albumin: 2.9 g/dL — ABNORMAL LOW (ref 3.8–4.8)
Alkaline Phosphatase: 137 IU/L — ABNORMAL HIGH (ref 39–117)
BUN/Creatinine Ratio: 18 (ref 10–24)
BUN: 19 mg/dL (ref 8–27)
Bilirubin Total: 1.4 mg/dL — ABNORMAL HIGH (ref 0.0–1.2)
CO2: 31 mmol/L — ABNORMAL HIGH (ref 20–29)
Calcium: 8.4 mg/dL — ABNORMAL LOW (ref 8.6–10.2)
Chloride: 88 mmol/L — ABNORMAL LOW (ref 96–106)
Creatinine, Ser: 1.07 mg/dL (ref 0.76–1.27)
GFR calc Af Amer: 83 mL/min/{1.73_m2} (ref >59)
GFR calc non Af Amer: 72 mL/min/{1.73_m2} (ref >59)
Globulin, Total: 4 g/dL (ref 1.5–4.5)
Glucose: 76 mg/dL (ref 65–99)
Potassium: 4.4 mmol/L (ref 3.5–5.2)
Sodium: 131 mmol/L — ABNORMAL LOW (ref 134–144)
Total Protein: 6.9 g/dL (ref 6.0–8.5)

## 2018-09-15 ENCOUNTER — Ambulatory Visit: Payer: 59 | Admitting: Cardiology

## 2018-09-17 ENCOUNTER — Other Ambulatory Visit: Payer: Self-pay

## 2018-09-17 ENCOUNTER — Ambulatory Visit (INDEPENDENT_AMBULATORY_CARE_PROVIDER_SITE_OTHER): Payer: 59 | Admitting: Cardiology

## 2018-09-17 ENCOUNTER — Encounter: Payer: Self-pay | Admitting: Cardiology

## 2018-09-17 VITALS — BP 96/67 | HR 97 | Temp 97.6°F | Ht 71.0 in | Wt 200.0 lb

## 2018-09-17 DIAGNOSIS — I311 Chronic constrictive pericarditis: Secondary | ICD-10-CM

## 2018-09-17 DIAGNOSIS — I5032 Chronic diastolic (congestive) heart failure: Secondary | ICD-10-CM | POA: Diagnosis not present

## 2018-09-17 MED ORDER — TORSEMIDE 20 MG PO TABS: ORAL_TABLET | ORAL | 3 refills | Status: AC

## 2018-09-17 NOTE — Progress Notes (Signed)
Clinical Summary Kent Bryant is a 68 y.o.male seen today for follow up of the following medical problems.    1.Constrictive pericarditis - patient previously followed at [facility] - we had seen him briefly during admission 07/2017 at [facility] presenting with anasarca ans signs of liver congestion - echo findings were concerning for constrictive pericarditis. Previous imaging at [facility] 06/2017 was reviewed and showed evidence of pericardial calcification. He was transferred to [facility] that admission since that is where he followed - from notes turned down by CT surgery for consideration for possible pericardectomy at the time due to comorbidities.  -weights 270 during 07/2017 admission,down to 190 lbs over last few months - home weights 190-192, no LE edema. Stable breathing - compliant with diuretics. - he cancelled his appointment with CHF clinic, has not been willing to reschedule    - admitted 07/2018 with HF exacerbation. Diuresis limited by soft bp's.  - taking torsemide 60mg  AM 40mg  pm. Seems to be controlling his swelling. He is having legs wrapped at home by home care.  - weights 195-200 lbs which is his baseline  2. Portal hypertension - followed by GI. 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 Chest wall hematoma - 06/2017 CT at [facility] showed larger left posterolateral chest wall hematoma.  - admitted to [facility] 06/2017 after a fall at home. Admitted with Hgb 6.9, abnormal PTT. Found toh ave a large extrathoracic hemoatoma long the left posterior chest and abdominal wall. 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 Plans were for cardiac MRI but patient was too dyspneic to lay down  for procedure.  5. Lymphadenopathy - 06/2017 CTA chest showed numerous axillary, mediastinal, abomdinal, and pelvic lymph nodes - appears had lymph node biospy done at [facility]  - from records at [facility] scan without significant hypermetabolic adenopathy. Lymph node biopsy 06/2017 was nondiagnostic. From heme/onc notes indolent lymphoma cannot be entirely ruled out. Has been clinically stable without recent bleeding.  - during admission at [facility] he received high dose dexamethasone and 1 dose of rituximab.     Past Medical History:  Diagnosis Date  . Abnormal CXR (chest x-ray)   . Agoraphobia   . Anxiety   . Bipolar disorder ([place])   . Chronic low back pain   . Coagulopathy ([place])   . COPD (chronic obstructive pulmonary disease) ([place])   . Depression   . Depression   . Hemophilia ([place])   . Insomnia   . Osteopenia   . Peripheral edema   . Peripheral neuropathy   . Peripheral neuropathy   . Vitamin D deficiency      Allergies  Allergen Reactions  . Doxycycline Dermatitis and Itching  . Petrolatum Rash     Current Outpatient Medications  Medication Sig Dispense Refill  . atorvastatin (LIPITOR) 40 MG tablet Take 1 tablet (40 mg total) by mouth daily at 6 PM. 90 tablet 3  . cholecalciferol (VITAMIN D) 1000 units tablet Take 2,000 Units by mouth daily.    Marland Kitchen ENULOSE 10 GM/15ML SOLN TAKE 15 MLS (10 G TOTAL) BY MOUTH 3 (THREE) TIMES DAILY. 1892 mL 0  . feeding supplement, ENSURE ENLIVE, (ENSURE ENLIVE) LIQD Take  237 mLs by mouth 2 (two) times daily between meals. 237 mL 12  . ferrous sulfate 325 (65 FE) MG tablet Take 1 tablet (325 mg total) by mouth 2 (two) times daily with a meal. [id number] tablet 3  . folic acid (FOLVITE) 1 MG tablet Take 1 tablet (1 mg total) by mouth daily at 2 PM. 90 tablet 3  . gabapentin (NEURONTIN) 300 MG capsule TAKE 2 CAPSULES (600 MG TOTAL) BY MOUTH 3 (THREE) TIMES DAILY. 540 capsule 0  . potassium chloride SA (KLOR-CON M20) 20 MEQ tablet 2  tablets BID 240 tablet 1  . RESTASIS 0.05 % ophthalmic emulsion Place 1 drop into both eyes daily.    . sertraline (ZOLOFT) 50 MG tablet Take 1 tablet (50 mg total) by mouth daily. 90 tablet 3  . spironolactone (ALDACTONE) 25 MG tablet Take 1 tablet (25 mg total) by mouth 2 (two) times daily. 180 tablet 3  . torsemide (DEMADEX) 20 MG tablet TAKE 2 TABLETS BY MOUTH TWICE A DAY 360 tablet 1  . traZODone (DESYREL) 50 MG tablet TAKE 1 TO 2 TABLETS BY MOUTH AT BEDTIME AS NEEDED FOR SLEEP 180 tablet 0  . triamcinolone cream (KENALOG) 0.1 %     . Wound Dressings (MEDIHONEY WOUND/BURN DRESSING) GEL Apply 1 application topically daily. 44 mL 1   No current facility-administered medications for this visit.      Past Surgical History:  Procedure Laterality Date  . CATARACT EXTRACTION  05/29/2017  . CATARACT EXTRACTION W/PHACO Left 06/13/2017   Procedure: CATARACT EXTRACTION PHACO AND INTRAOCULAR LENS PLACEMENT LEFT EYE;  Surgeon: Baruch Goldmann, MD;  Location: [facility];  Service: Ophthalmology;  Laterality: Left;  CDE: 23.32     Allergies  Allergen Reactions  . Doxycycline Dermatitis and Itching  . Petrolatum Rash      Family History  Problem Relation Age of Onset  . Stroke Mother   . Colon cancer Neg Hx   . Esophageal cancer Neg Hx   . Liver disease Neg Hx   . Pancreatic cancer Neg Hx   . Stomach cancer Neg Hx   . Inflammatory bowel disease Neg Hx      Social History Kent Bryant reports that he has been smoking cigarettes. He has a 1.50 pack-year smoking history. He has never used smokeless tobacco. Kent Bryant reports no history of alcohol use.   Review of Systems CONSTITUTIONAL: +fatigue HEENT: Eyes: No visual loss, blurred vision, double vision or yellow sclerae.No hearing loss, sneezing, congestion, runny nose or sore throat.  SKIN: No rash or itching.  CARDIOVASCULAR: per hpi RESPIRATORY: No shortness of breath, cough or sputum.  GASTROINTESTINAL: No anorexia, nausea, vomiting or  diarrhea. No abdominal pain or blood.  GENITOURINARY: No burning on urination, no polyuria NEUROLOGICAL: No headache, dizziness, syncope, paralysis, ataxia, numbness or tingling in the extremities. No change in bowel or bladder control.  MUSCULOSKELETAL: No muscle, back pain, joint pain or stiffness.  LYMPHATICS: No enlarged nodes. No history of splenectomy.  PSYCHIATRIC: No history of depression or anxiety.  ENDOCRINOLOGIC: No reports of sweating, cold or heat intolerance. No polyuria or polydipsia.  Marland Kitchen   Physical Examination Today's Vitals   09/17/18 1255  BP: 96/67  Pulse: 97  Temp: 97.6 F (36.4 C)  SpO2: 95%  Weight: 200 lb (90.7 kg)  Height: 5\' 11"  (1.803 m)   Body mass index is 27.89 kg/m.  Gen: resting comfortably, no acute distress HEENT: no scleral icterus, pupils equal round and reactive, no palptable cervical  adenopathy,  CV: RRR, no m/r/g, no jvd Resp: Clear to auscultation bilaterally GI: abdomen is soft, non-tender, non-distended, normal bowel sounds, no hepatosplenomegaly MSK: extremities are warm, no edema.  Skin: warm, no rash Neuro:  no focal deficits Psych: appropriate affect   Diagnostic Studies 07/2017 echo Study Conclusions  - Left ventricle: The cavity size was normal. Wall thickness was normal. Systolic function was normal. The estimated ejection fraction was in the range of 55% to 60%. Mixed diastolic findings. High E/A ratio with borderline low deceleration time, blunting of pulmonary venous flow, normal left atrium, normal tissue velocities. There is a septal bounce. - Aortic valve: Valve area (VTI): 1.88 cm^2. Valve area (Vmax): 1.86 cm^2. Valve area (Vmean): 1.77 cm^2. - Mitral valve: There was mild regurgitation. - Pulmonary veins: There is systolic blunting of pulmonary vein flow consistent with elevated LA pressure. - Right ventricle: There is intermittent flattening of the ventricular septum that appears to vary with  the respriatory cycle. - There are some signs concerning for possible constrictive pericarditis. Will order repeat limited study to evaluate respiratory variation of inflow as well as repeat mitral inflow, tissue velocities, obtain hepatic flow Dopplers.  10/2017 CT A/P IMPRESSION: 1. Although there is only subtle lobularity of the hepatic margin, there are considerable findings of portal venous hypertension including uphill paraesophageal varices, recanalization of the umbilical vein, and splenorenal shunting. These factors raise suspicion for underlying cirrhotic liver disease. 2. Contracted gallbladder with small gallstones. 3. Third spacing of fluid with mesenteric and subcutaneous edema, upper abdominal ascites, and right greater than left pleural effusions. 4. Findings of prominent prior calcific pericarditis, with dense sheets of calcification along the pericardial margins. These outline several collections of complex fluid including a 97 cubic cm anterior inferior pericardial fluid collection. 5. Pelvic adenopathy in addition to mildly enlarged retroperitoneal adenopathy. While some of this could well be reactive adenopathy from cirrhosis, the pelvic adenopathy in particular is nonspecific and entity such as lymphoma are not readily excluded based on imaging. 6. Impingement at L4-5 and [id number]. 7. Umbilical hernia containing adipose tissue and vessels.   Throckmorton studies 07/2017 echo SUMMARY The left ventricular size is normal. There is normal left ventricular wall thickness.  Left ventricular systolic function is normal. LV ejection fraction = 65-70%. No segmental wall motion abnormalities seen in the left ventricle. Septal bounce is noted.  Left ventricular filling pattern is indeterminate. The right ventricular cavity is small on RV focused A4c views due to  extrinsic compression from a bright echodensity better visualized by prior CT  chests. The  right ventricular systolic function is normal. The left atrium is mildly dilated.  Right atrial size is normal with no evidence of collapse on diastole. There is mild aortic and mitral regurgitation. The IVC is mildly dilated with an abnormal collapsibility index, this  suggestive of increased right atrial pressure. There is a ascites present. Prior study, dated 06/26/17, is a limited study, however no significant change  is noted.   06/2017 CT PET 1.No hypermetabolic adenopathy to suggest aggressive lymphoma. PET/CT is limited in evaluation for indolent lymphoma. 2.Decreased size of the hematoma along the left chest wall.Mildly increased uptake within the right semitendinosus musculature and left lower quadrant abdominal wall, favored to relate to additional sites of contusion/hematoma.  06/2017 CT chest CONCLUSION:  Moderate bilateral pleural effusions have increased slightly.  Slight interval worsening of atelectasis associated with pleural effusions. The left lower lobe and lingula are densely opacified. Pneumonia or aspiration could be  superimposed on atelectasis.  Unchanged extensive coarse pericardial calcification, likely related to remote hematoma. This places the patient at risk for constrictive physiology. There is a suggestion of distention of the inferior vena cava.  Patulous esophagus with fluid level in the mid thoracic esophagus, placing the patient risk for aspiration.  Interval development of small volume ascites.  Body wall edema has improved slightly. Left chest wall hematoma has improved slightly.  Suspected development of bilateral hemarthrosis in the partially visualized glenohumeral joints   06/2017 US guided lymph node biopsy  1.Technically successful U/S guided fine-needle aspiration of left inguinal lymph node. 2.No immediate complications. 3.Samples were sent to Pathology.   06/2017 CTA chest A/P 1. Large left posterolateral chest wall  hematoma. Limited evaluation for active extravasation due to  incorrect bolus timing. If there are clinical findings of active extravasation, a follow-up chest CTA could be performed. 2. Numerous axillary, mediastinal, abdominal and pelvic lymph nodes. Bilateral inguinal lymph nodes and right pelvic lymph nodes are enlarged as described above. The differential includes lymphoma or infectious/inflammatory etiologies. Given the distribution, metastatic disease is felt to be less likely. 3. Extensive pericardial calcification which is associated with constrictive pericarditis. Echocardiogram may be helpful for further evaluation. 4. Small bilateral pleural effusions with bibasilar atelectasis.     Assessment and Plan   1. Constrictive pericarditis/Chronic diastolic HF - he has refused any further workup and is not interested in any point at considering surgery - fluid status is controlled on torsemide 60mg  in AM and 40mg  in PM, continue current regimen.      Arnoldo Lenis, M.D.

## 2018-09-17 NOTE — Patient Instructions (Signed)
Medication Instructions: INCREASE Torsemide to 60 mg am and 40 mg pm  Labwork: None  Procedures/Testing: None  Follow-Up: 3 months Virtual visit with Dr.Branch  Any Additional Special Instructions Will Be Listed Below (If Applicable).     If you need a refill on your cardiac medications before your next appointment, please call your pharmacy.     Thank you for choosing [facility] !

## 2018-09-17 NOTE — Addendum Note (Signed)
Addended by: Barbarann Ehlers A on: 09/17/2018 01:43 PM   Modules accepted: Orders

## 2018-09-29 ENCOUNTER — Other Ambulatory Visit: Payer: Self-pay | Admitting: Family Medicine

## 2018-09-29 DIAGNOSIS — F5104 Psychophysiologic insomnia: Secondary | ICD-10-CM

## 2018-09-29 DIAGNOSIS — F325 Major depressive disorder, single episode, in full remission: Secondary | ICD-10-CM

## 2018-10-05 ENCOUNTER — Telehealth: Payer: Self-pay

## 2018-10-05 NOTE — Telephone Encounter (Signed)
Returned call to [facility]. Let her know plan. Placed order for monitor.

## 2018-10-05 NOTE — Telephone Encounter (Signed)
Can we set him up with a 24 hour holter   Zandra Abts MD

## 2018-10-05 NOTE — Telephone Encounter (Signed)
Pt's home health nurse called to report that over the past few weeks the pt's heart rate is staying up. [phone]) She stated the parameters that his pcp set was to call them anything above 110 so she was wondering if he would need a low dose beta-blocker or if she needs a different set of parameters to go bye. Please advise.

## 2018-10-16 ENCOUNTER — Ambulatory Visit: Payer: 59

## 2018-10-16 DIAGNOSIS — R002 Palpitations: Secondary | ICD-10-CM

## 2018-10-26 ENCOUNTER — Encounter: Payer: Self-pay | Admitting: Family Medicine

## 2018-10-26 ENCOUNTER — Other Ambulatory Visit: Payer: Self-pay

## 2018-10-26 ENCOUNTER — Ambulatory Visit (INDEPENDENT_AMBULATORY_CARE_PROVIDER_SITE_OTHER): Payer: 59 | Admitting: Family Medicine

## 2018-10-26 VITALS — BP 107/70 | HR 129 | Temp 98.6°F | Ht 71.0 in | Wt 215.6 lb

## 2018-10-26 DIAGNOSIS — Z23 Encounter for immunization: Secondary | ICD-10-CM

## 2018-10-26 DIAGNOSIS — R609 Edema, unspecified: Secondary | ICD-10-CM | POA: Diagnosis not present

## 2018-10-26 NOTE — Progress Notes (Signed)
BP 107/70   Pulse (!) 129   Temp 98.6 F (37 C) (Temporal)   Ht '5\' 11"'  (1.803 m)   Wt 215 lb 9.6 oz (97.8 kg)   SpO2 99%   BMI 30.07 kg/m    Subjective:   Patient ID: Kent Bryant, male    DOB: 03/02/1950, 68 y.o.   MRN: [id number]  HPI: Kent Bryant is a 68 y.o. male presenting on 10/26/2018 for 1 month follow up   HPI Patient is coming of for peripheral edema and swelling and wound follow-up.  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  Patient is still taking high-dose diuretics and wants to recheck the renal function, last time it was normal.  He says he still having weeping from between his toes on his right foot but otherwise his right leg is looking a lot better.  Relevant past medical, surgical, family and social history reviewed and updated as indicated. Interim medical history since our last visit reviewed. Allergies and medications reviewed and updated.  Review of Systems  Constitutional: Negative for chills and fever.  Respiratory: Negative for shortness of breath and wheezing.   Cardiovascular: Positive for leg swelling. Negative for chest pain.  Musculoskeletal: Negative for back pain and gait problem.  Skin: Negative for rash.  All other systems reviewed and are negative.   Per HPI unless specifically indicated above   Allergies as of 10/26/2018      Reactions   Doxycycline Dermatitis, Itching   Petrolatum Rash      Medication List       Accurate as of October 26, 2018  1:31 PM. If you have any questions, ask your nurse or doctor.        atorvastatin 40 MG tablet Commonly known as: LIPITOR Take 1 tablet (40 mg total) by mouth daily at 6 PM.   cholecalciferol 1000 units tablet Commonly known as: VITAMIN D Take 2,000 Units by mouth daily.    Enulose 10 GM/15ML Soln Generic drug: lactulose (encephalopathy) TAKE 15 ML (10 G TOTAL) BY MOUTH 3 (THREE) TIMES DAILY.   feeding supplement (ENSURE ENLIVE) Liqd Take 237 mLs by mouth 2 (two) times daily between meals.   ferrous sulfate 325 (65 FE) MG tablet Take 1 tablet (325 mg total) by mouth 2 (two) times daily with a meal.   folic acid 1 MG tablet Commonly known as: FOLVITE Take 1 tablet (1 mg total) by mouth daily at 2 PM.   gabapentin 300 MG capsule Commonly known as: NEURONTIN TAKE 2 CAPSULES (600 MG TOTAL) BY MOUTH 3 (THREE) TIMES DAILY.   Medihoney Wound/Burn Dressing Gel Apply 1 application topically daily.   potassium chloride SA 20 MEQ tablet Commonly known as: [place]-Con M20 TAKE 2 TABLETS BY MOUTH TWICE A DAY   Restasis 0.05 % ophthalmic emulsion Generic drug: cycloSPORINE Place 1 drop into both eyes daily.   sertraline 50 MG tablet Commonly known as: ZOLOFT Take 1 tablet (50 mg total) by mouth daily.   spironolactone 25 MG tablet Commonly known as: ALDACTONE Take 1 tablet (25 mg total) by mouth 2 (two) times daily.   torsemide 20 MG tablet Commonly known as: DEMADEX Take 60 mg ( 3 tablets) am and 40 mg  (  2 tablets)  pm   traZODone 50 MG tablet Commonly known as: DESYREL TAKE 1 TO 2 TABLETS BY MOUTH AT BEDTIME AS NEEDED FOR SLEEP   triamcinolone cream 0.1 % Commonly known as: KENALOG        Objective:   BP 107/70   Pulse (!) 129   Temp 98.6 F (37 C) (Temporal)   Ht '5\' 11"'  (1.803 m)   Wt 215 lb 9.6 oz (97.8 kg)   SpO2 99%   BMI 30.07 kg/m   Wt Readings from Last 3 Encounters:  10/26/18 215 lb 9.6 oz (97.8 kg)  09/17/18 200 lb (90.7 kg)  09/10/18 195 lb 6.4 oz (88.6 kg)    Physical Exam Vitals signs and nursing note reviewed.  Constitutional:      General: He is not in acute distress.    Appearance: He is well-developed. He is not diaphoretic.  Eyes:     General: No scleral icterus.    Conjunctiva/sclera: Conjunctivae normal.   Neck:     Musculoskeletal: Neck supple.     Thyroid: No thyromegaly.  Cardiovascular:     Rate and Rhythm: Normal rate and regular rhythm.     Heart sounds: Normal heart sounds. No murmur.  Pulmonary:     Effort: Pulmonary effort is normal. No respiratory distress.     Breath sounds: Normal breath sounds. No wheezing.  Musculoskeletal: Normal range of motion.        General: Swelling (2+ pitting edema in his foot on the right side with weeping between the toes, no erythema or signs of infection) present.  Lymphadenopathy:     Cervical: No cervical adenopathy.  Skin:    General: Skin is warm and dry.     Findings: No rash.  Neurological:     Mental Status: He is alert and oriented to person, place, and time.     Coordination: Coordination normal.  Psychiatric:        Behavior: Behavior normal.     Rewrap in the same way that wound care had it wrapped and he will continue to follow-up with home health wound care  Assessment & Plan:   Problem List Items Addressed This Visit      Other   Peripheral edema - Primary   Relevant Orders   BMP8+EGFR      Patient's legs are looking a lot better, continue with home health wound care, his renal function looks great last time and if stable this time then we will see back in 3 months, if it is off this time then we will likely see him back to recheck the renal function sooner Follow up plan: Return in about 3 months (around 01/26/2019), or if symptoms worsen or fail to improve, for Recheck edema and renal function.  Counseling provided for all of the vaccine components Orders Placed This Encounter  Procedures  . BMP8+EGFR    Caryl Pina, MD [facility] 10/26/2018, 1:31 PM

## 2018-10-26 NOTE — Addendum Note (Signed)
Addended by: Nigel Berthold C on: 10/26/2018 01:46 PM   Modules accepted: Orders

## 2018-10-27 LAB — BMP8+EGFR
BUN/Creatinine Ratio: 13 (ref 10–24)
BUN: 15 mg/dL (ref 8–27)
CO2: 29 mmol/L (ref 20–29)
Calcium: 8.6 mg/dL (ref 8.6–10.2)
Chloride: 94 mmol/L — ABNORMAL LOW (ref 96–106)
Creatinine, Ser: 1.13 mg/dL (ref 0.76–1.27)
GFR calc Af Amer: 78 mL/min/{1.73_m2} (ref >59)
GFR calc non Af Amer: 67 mL/min/{1.73_m2} (ref >59)
Glucose: 83 mg/dL (ref 65–99)
Potassium: 4.3 mmol/L (ref 3.5–5.2)
Sodium: 136 mmol/L (ref 134–144)

## 2018-10-28 ENCOUNTER — Ambulatory Visit (INDEPENDENT_AMBULATORY_CARE_PROVIDER_SITE_OTHER): Payer: 59

## 2018-10-28 ENCOUNTER — Other Ambulatory Visit: Payer: Self-pay

## 2018-10-28 DIAGNOSIS — K7460 Unspecified cirrhosis of liver: Secondary | ICD-10-CM

## 2018-10-28 DIAGNOSIS — L03115 Cellulitis of right lower limb: Secondary | ICD-10-CM

## 2018-10-28 DIAGNOSIS — I872 Venous insufficiency (chronic) (peripheral): Secondary | ICD-10-CM

## 2018-10-28 DIAGNOSIS — D66 Hereditary factor VIII deficiency: Secondary | ICD-10-CM

## 2018-10-28 DIAGNOSIS — L89893 Pressure ulcer of other site, stage 3: Secondary | ICD-10-CM

## 2018-10-28 DIAGNOSIS — L97312 Non-pressure chronic ulcer of right ankle with fat layer exposed: Secondary | ICD-10-CM

## 2018-10-28 DIAGNOSIS — F319 Bipolar disorder, unspecified: Secondary | ICD-10-CM

## 2018-10-28 DIAGNOSIS — Z9181 History of falling: Secondary | ICD-10-CM

## 2018-10-28 DIAGNOSIS — J449 Chronic obstructive pulmonary disease, unspecified: Secondary | ICD-10-CM

## 2018-10-28 DIAGNOSIS — I890 Lymphedema, not elsewhere classified: Secondary | ICD-10-CM

## 2018-10-28 DIAGNOSIS — I509 Heart failure, unspecified: Secondary | ICD-10-CM

## 2018-10-28 DIAGNOSIS — L89313 Pressure ulcer of right buttock, stage 3: Secondary | ICD-10-CM | POA: Diagnosis not present

## 2018-11-09 ENCOUNTER — Other Ambulatory Visit: Payer: Self-pay

## 2018-11-09 ENCOUNTER — Telehealth: Payer: Self-pay | Admitting: Cardiology

## 2018-11-09 DIAGNOSIS — R002 Palpitations: Secondary | ICD-10-CM

## 2018-11-09 MED ORDER — TORSEMIDE 20 MG PO TABS
40.00 | ORAL_TABLET | ORAL | Status: DC
Start: 2018-11-12 — End: 2018-11-09

## 2018-11-09 MED ORDER — MUPIROCIN 2 % EX OINT
TOPICAL_OINTMENT | CUTANEOUS | Status: DC
Start: 2018-11-12 — End: 2018-11-09

## 2018-11-09 MED ORDER — TRAZODONE HCL 50 MG PO TABS
50.00 | ORAL_TABLET | ORAL | Status: DC
Start: ? — End: 2018-11-09

## 2018-11-09 MED ORDER — GENERIC EXTERNAL MEDICATION
Status: DC
Start: ? — End: 2018-11-09

## 2018-11-09 MED ORDER — TORSEMIDE 20 MG PO TABS
60.00 | ORAL_TABLET | ORAL | Status: DC
Start: 2018-11-13 — End: 2018-11-09

## 2018-11-09 MED ORDER — THERA PO TABS
1.00 | ORAL_TABLET | ORAL | Status: DC
Start: 2018-11-10 — End: 2018-11-09

## 2018-11-09 MED ORDER — LACTULOSE 10 GM/15ML PO SOLN
20.00 | ORAL | Status: DC
Start: 2018-11-12 — End: 2018-11-09

## 2018-11-09 MED ORDER — ACETAMINOPHEN 325 MG PO TABS
650.00 | ORAL_TABLET | ORAL | Status: DC
Start: ? — End: 2018-11-09

## 2018-11-09 MED ORDER — SPIRONOLACTONE 25 MG PO TABS
25.00 | ORAL_TABLET | ORAL | Status: DC
Start: 2018-11-13 — End: 2018-11-09

## 2018-11-09 MED ORDER — METOPROLOL TARTRATE 25 MG PO TABS
25.00 | ORAL_TABLET | ORAL | Status: DC
Start: 2018-11-09 — End: 2018-11-09

## 2018-11-09 MED ORDER — THIAMINE HCL 100 MG PO TABS
100.00 | ORAL_TABLET | ORAL | Status: DC
Start: 2018-11-10 — End: 2018-11-09

## 2018-11-09 MED ORDER — GABAPENTIN 300 MG PO CAPS
600.00 | ORAL_CAPSULE | ORAL | Status: DC
Start: 2018-11-12 — End: 2018-11-09

## 2018-11-09 MED ORDER — MORPHINE SULFATE (PF) 2 MG/ML IV SOLN
2.00 | INTRAVENOUS | Status: DC
Start: ? — End: 2018-11-09

## 2018-11-09 MED ORDER — FERROUS SULFATE 325 (65 FE) MG PO TABS
325.00 | ORAL_TABLET | ORAL | Status: DC
Start: 2018-11-12 — End: 2018-11-09

## 2018-11-09 MED ORDER — FOLIC ACID 1 MG PO TABS
1.00 | ORAL_TABLET | ORAL | Status: DC
Start: 2018-11-13 — End: 2018-11-09

## 2018-11-09 MED ORDER — ATORVASTATIN CALCIUM 40 MG PO TABS
40.00 | ORAL_TABLET | ORAL | Status: DC
Start: 2018-11-13 — End: 2018-11-09

## 2018-11-09 MED ORDER — SODIUM CHLORIDE 0.9 % IV SOLN
20.00 | INTRAVENOUS | Status: DC
Start: ? — End: 2018-11-09

## 2018-11-09 MED ORDER — NICOTINE 14 MG/24HR TD PT24
1.00 | MEDICATED_PATCH | TRANSDERMAL | Status: DC
Start: 2018-11-13 — End: 2018-11-09

## 2018-11-09 MED ORDER — POTASSIUM CHLORIDE CRYS ER 20 MEQ PO TBCR
40.00 | EXTENDED_RELEASE_TABLET | ORAL | Status: DC
Start: 2018-11-10 — End: 2018-11-09

## 2018-11-09 MED ORDER — SODIUM CHLORIDE 0.9 % IV SOLN
10.00 | INTRAVENOUS | Status: DC
Start: ? — End: 2018-11-09

## 2018-11-09 MED ORDER — GENERIC EXTERNAL MEDICATION
0.50 | Status: DC
Start: ? — End: 2018-11-09

## 2018-11-09 MED ORDER — SERTRALINE HCL 50 MG PO TABS
50.00 | ORAL_TABLET | ORAL | Status: DC
Start: 2018-11-13 — End: 2018-11-09

## 2018-11-09 MED ORDER — CYCLOSPORINE 0.05 % OP EMUL
1.00 | OPHTHALMIC | Status: DC
Start: 2018-11-12 — End: 2018-11-09

## 2018-11-09 MED ORDER — NITROGLYCERIN 0.4 MG SL SUBL
0.40 | SUBLINGUAL_TABLET | SUBLINGUAL | Status: DC
Start: ? — End: 2018-11-09

## 2018-11-09 MED ORDER — LEVALBUTEROL HCL 0.63 MG/3ML IN NEBU
0.63 | INHALATION_SOLUTION | RESPIRATORY_TRACT | Status: DC
Start: ? — End: 2018-11-09

## 2018-11-09 NOTE — Telephone Encounter (Signed)
Please call Charmayne Sheer from [facility] concerning pt's Holter Monitor, they're need the results.   [phone]

## 2018-11-09 NOTE — Telephone Encounter (Addendum)
Results to downloaded today for Dr.Branch to read.I notified Charmayne Sheer at [facility] and she will check Epic.She states he is in the hospital today.

## 2018-11-13 MED ORDER — GENERIC EXTERNAL MEDICATION
Status: DC
Start: ? — End: 2018-11-13

## 2018-11-13 MED ORDER — SODIUM ZIRCONIUM CYCLOSILICATE 10 G PO PACK
10.00 | PACK | ORAL | Status: DC
Start: 2018-11-12 — End: 2018-11-13

## 2018-11-13 MED ORDER — PREDNISONE 20 MG PO TABS
40.00 | ORAL_TABLET | ORAL | Status: DC
Start: 2018-11-13 — End: 2018-11-13

## 2018-11-13 MED ORDER — LEVALBUTEROL HCL 0.63 MG/3ML IN NEBU
0.63 | INHALATION_SOLUTION | RESPIRATORY_TRACT | Status: DC
Start: ? — End: 2018-11-13

## 2018-11-13 MED ORDER — FENTANYL CITRATE-NACL 2.5-0.9 MG/250ML-% IV SOLN
1.00 | INTRAVENOUS | Status: DC
Start: ? — End: 2018-11-13

## 2018-11-13 MED ORDER — MORPHINE SULFATE (PF) 4 MG/ML IV SOLN
2.00 | INTRAVENOUS | Status: DC
Start: ? — End: 2018-11-13

## 2018-11-13 MED ORDER — METOPROLOL TARTRATE 25 MG PO TABS
12.50 | ORAL_TABLET | ORAL | Status: DC
Start: 2018-11-12 — End: 2018-11-13

## 2018-11-13 MED ORDER — MIDODRINE HCL 5 MG PO TABS
10.00 | ORAL_TABLET | ORAL | Status: DC
Start: 2018-11-13 — End: 2018-11-13

## 2018-11-13 MED ORDER — CLOTRIMAZOLE-BETAMETHASONE 1-0.05 % EX CREA
TOPICAL_CREAM | CUTANEOUS | Status: DC
Start: 2018-11-13 — End: 2018-11-13

## 2018-12-08 DEATH — deceased

## 2018-12-17 ENCOUNTER — Telehealth: Payer: 59 | Admitting: Cardiology

## 2020-05-06 IMAGING — DX DG CHEST 2V
2 series · 2 of 2 positions shown · non-contrast
Comparison: 05/27/2016

CLINICAL DATA: Shortness of breath and abdominal swelling.

EXAM:
CHEST - 2 VIEW

[chest pa]
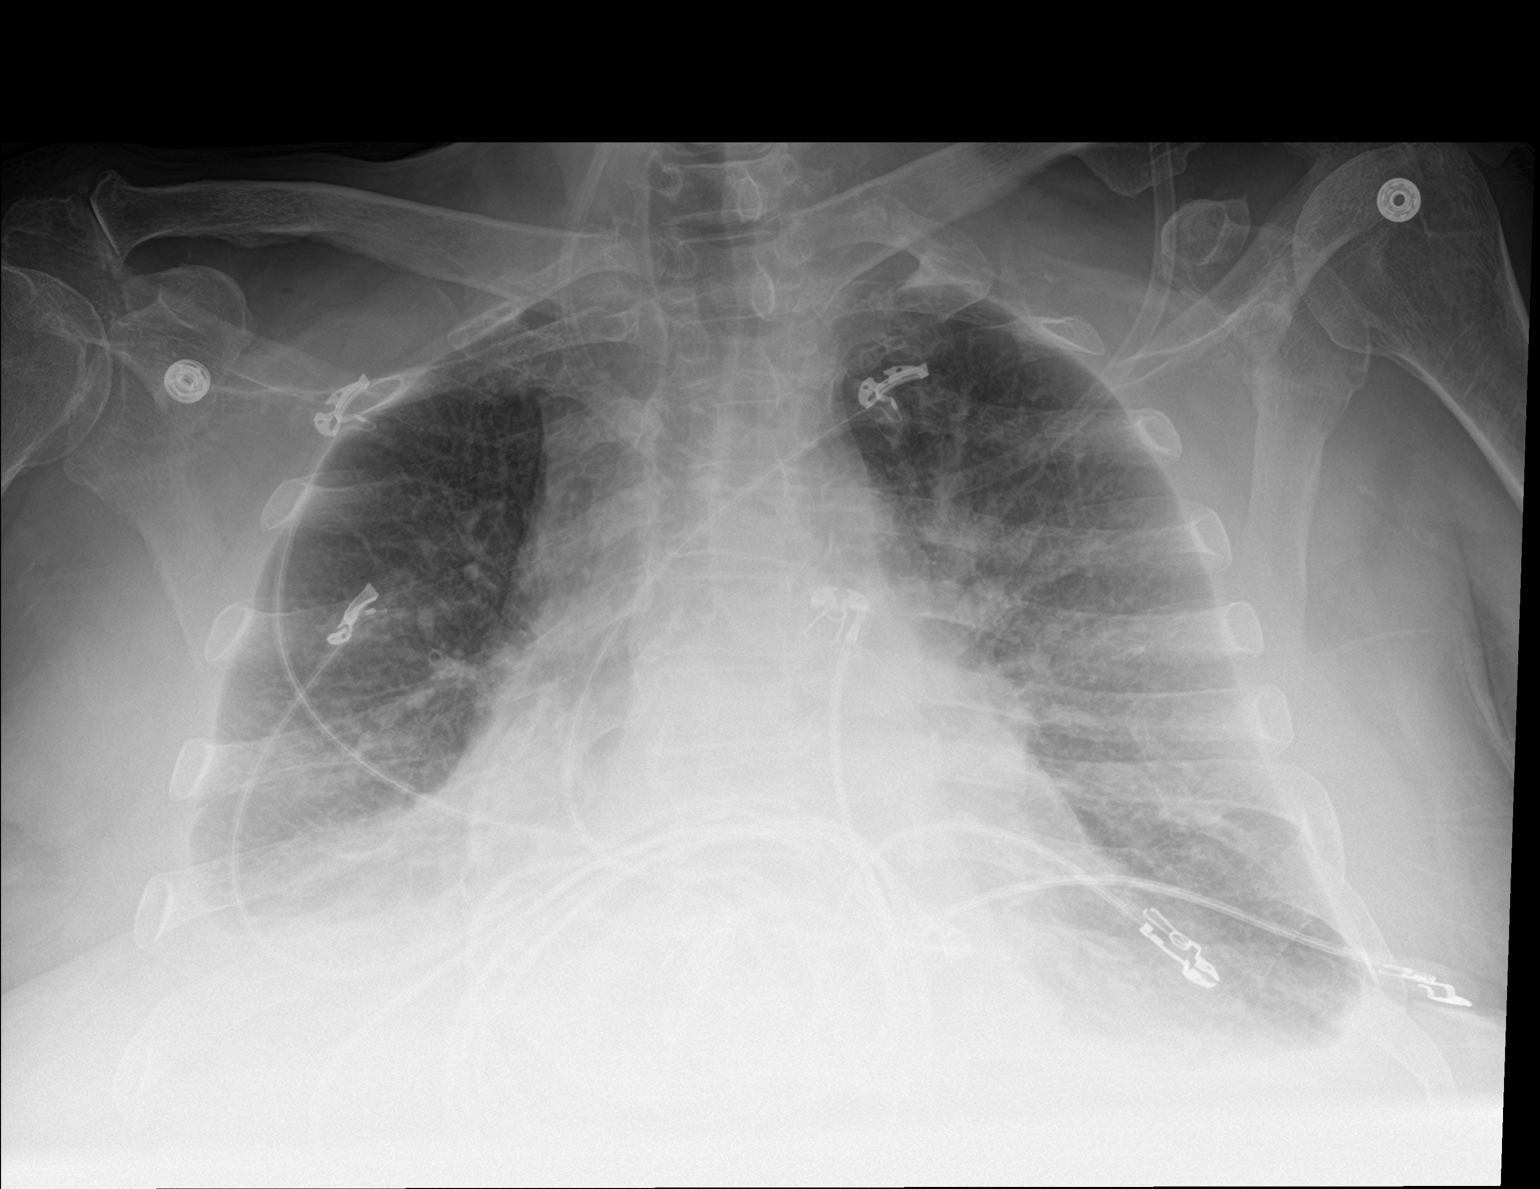

[chest lat]
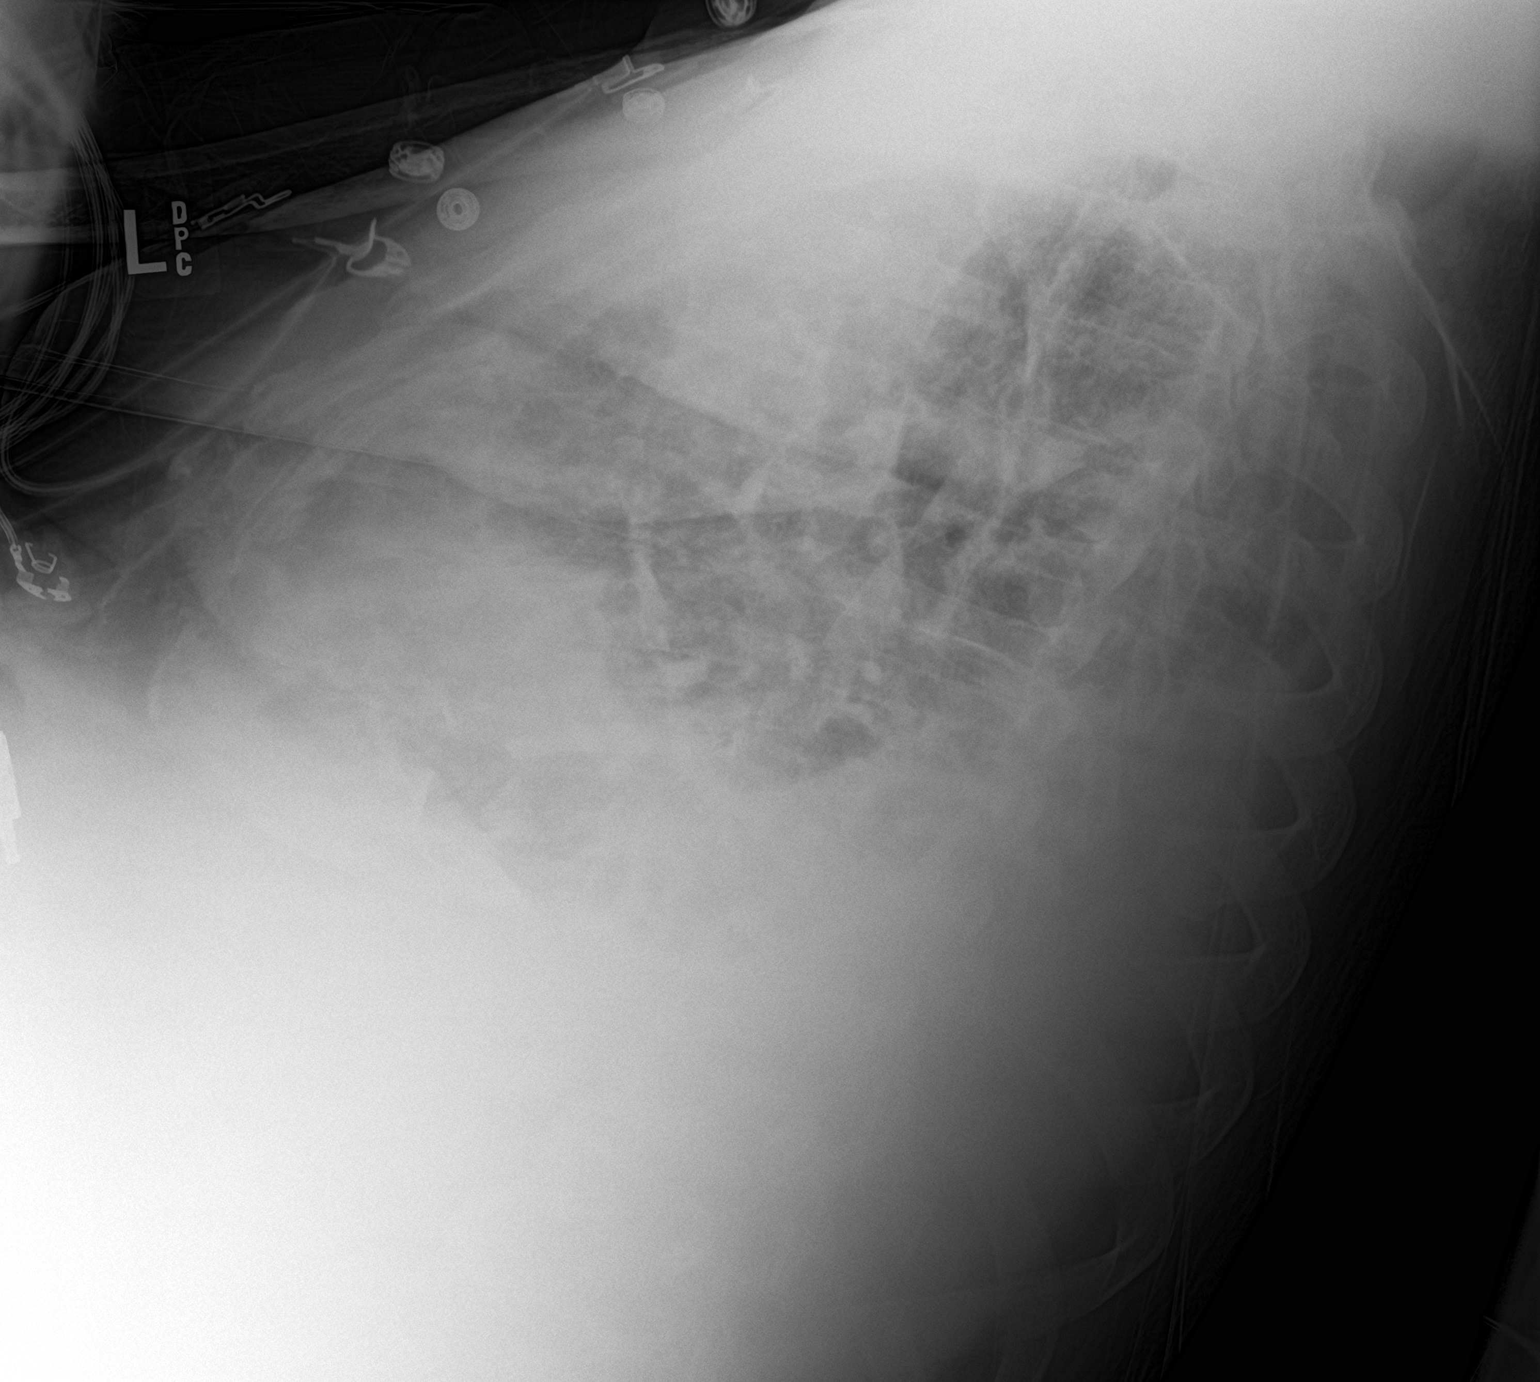

[2 of 2 positions shown; findings below may reference images not displayed]

FINDINGS: Very low bilateral lung volumes. Small bilateral pleural effusions.
Mild interstitial pulmonary prominence could be consistent with a
component of interstitial edema. No overt airspace edema, focal
airspace consolidation or pneumothorax. The heart size is within
normal limits.
IMPRESSION: Low lung volumes, small bilateral pleural effusions and potential
mild interstitial edema.

## 2020-05-07 IMAGING — US US ABDOMEN LIMITED
1 series · 4 of 4 positions shown · non-contrast
Comparison: 04/22/2017

CLINICAL DATA: Ascites

EXAM:
LIMITED ABDOMEN ULTRASOUND FOR ASCITES
TECHNIQUE: Limited ultrasound survey for ascites was performed in all four
abdominal quadrants.

[Series 1: us abdomen limited · 0.38mm/px · 4 of 4 slices shown]
[im 1/4]
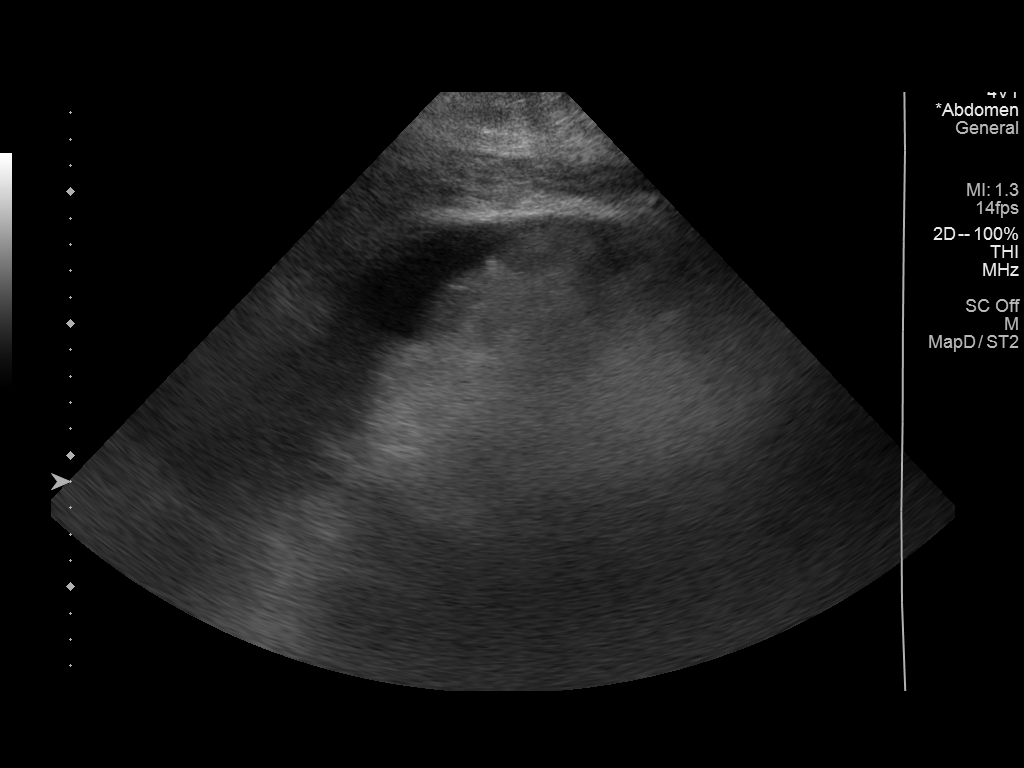
[im 2/4]
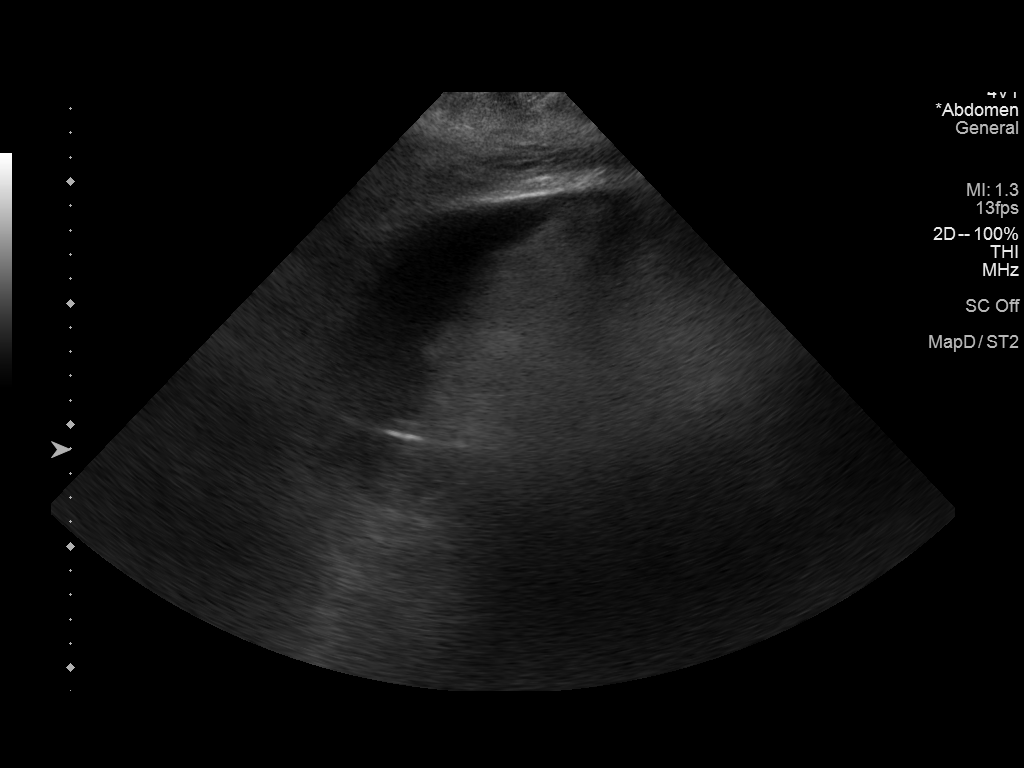
[im 3/4]
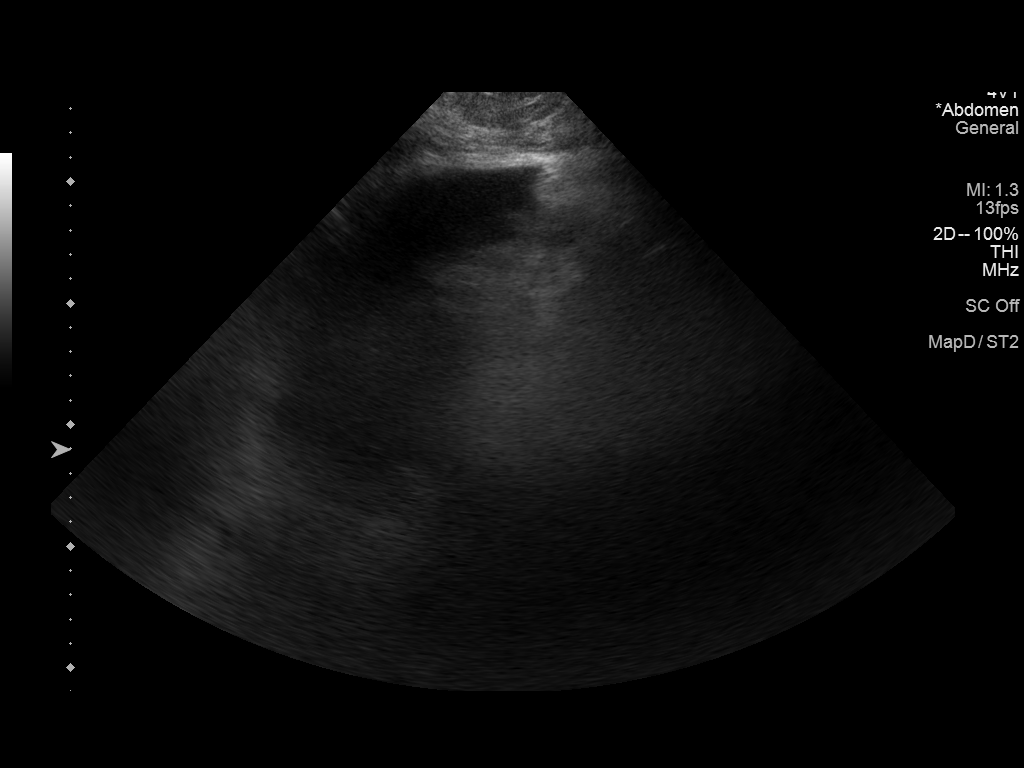
[im 4/4]
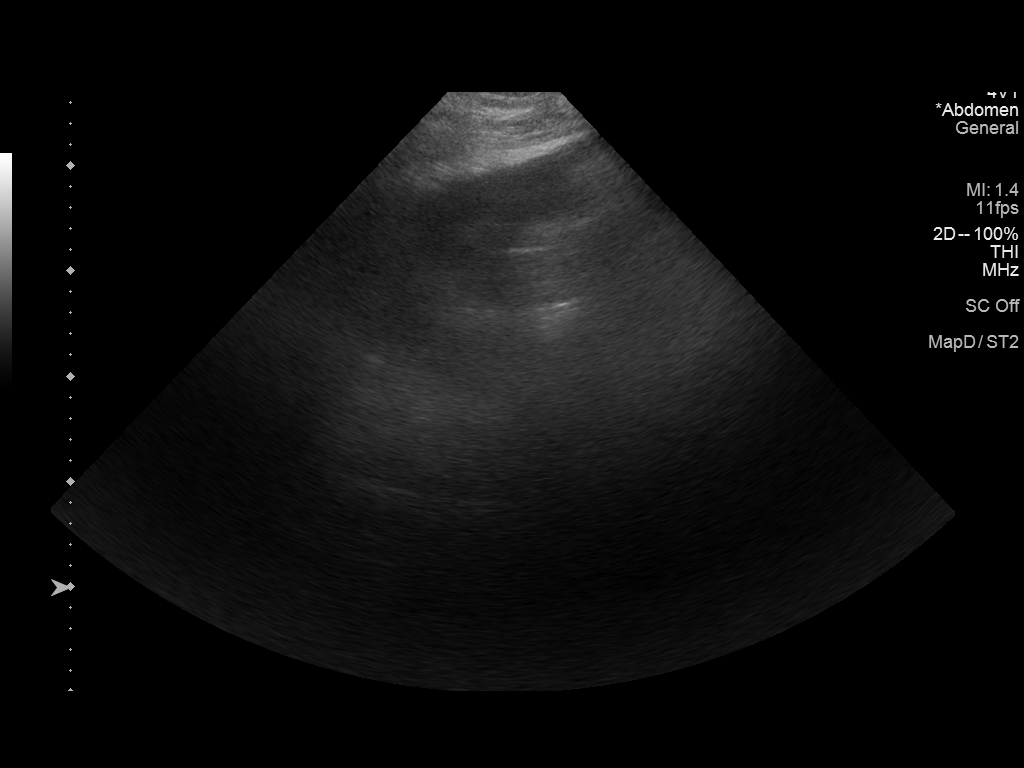

[4 of 4 positions shown; findings below may reference images not displayed]

FINDINGS: Small volume ascites identified in the RIGHT lower quadrant.

Minimal LEFT lower quadrant ascites.

Small amounts of fluid are seen perihepatic as well.

Markedly thickened abdominal wall is seen measuring between 7 cm and
9 cm thick with extensive subcutaneous/abdominal wall edema.

Volume of ascites is insufficient for paracentesis.
IMPRESSION: Small volume ascites insufficient for paracentesis.

Markedly thickened abdominal wall with extensive subcutaneous and
abdominal wall edema, wall measuring between 7 cm and 9 cm thick.
